# Patient Record
Sex: Male | Born: 1965 | Race: White | Hispanic: No | Marital: Married | Smoking: Never smoker
Health system: Southern US, Community
[De-identification: ages and names within clinical notes are randomized; demographics above are authoritative.]

## PROBLEM LIST (undated history)

## (undated) DIAGNOSIS — C801 Malignant (primary) neoplasm, unspecified: Secondary | ICD-10-CM

## (undated) DIAGNOSIS — I499 Cardiac arrhythmia, unspecified: Secondary | ICD-10-CM

## (undated) DIAGNOSIS — K219 Gastro-esophageal reflux disease without esophagitis: Secondary | ICD-10-CM

## (undated) HISTORY — DX: Gastro-esophageal reflux disease without esophagitis: K21.9

## (undated) HISTORY — PX: CHOLECYSTECTOMY: SHX55

---

## 2008-08-05 ENCOUNTER — Ambulatory Visit: Payer: Self-pay | Admitting: Sports Medicine

## 2008-08-05 ENCOUNTER — Encounter: Payer: Self-pay | Admitting: Sports Medicine

## 2008-08-05 DIAGNOSIS — M629 Disorder of muscle, unspecified: Secondary | ICD-10-CM | POA: Insufficient documentation

## 2008-08-05 DIAGNOSIS — M2140 Flat foot [pes planus] (acquired), unspecified foot: Secondary | ICD-10-CM | POA: Insufficient documentation

## 2008-08-05 DIAGNOSIS — M7750 Other enthesopathy of unspecified foot: Secondary | ICD-10-CM | POA: Insufficient documentation

## 2008-08-05 DIAGNOSIS — M722 Plantar fascial fibromatosis: Secondary | ICD-10-CM | POA: Insufficient documentation

## 2009-03-21 ENCOUNTER — Ambulatory Visit: Payer: Self-pay | Admitting: Sports Medicine

## 2009-03-21 DIAGNOSIS — S46819A Strain of other muscles, fascia and tendons at shoulder and upper arm level, unspecified arm, initial encounter: Secondary | ICD-10-CM

## 2009-03-21 DIAGNOSIS — M7710 Lateral epicondylitis, unspecified elbow: Secondary | ICD-10-CM | POA: Insufficient documentation

## 2009-03-21 DIAGNOSIS — S43499A Other sprain of unspecified shoulder joint, initial encounter: Secondary | ICD-10-CM | POA: Insufficient documentation

## 2009-04-25 ENCOUNTER — Ambulatory Visit: Payer: Self-pay | Admitting: Sports Medicine

## 2009-05-30 ENCOUNTER — Ambulatory Visit: Payer: Self-pay | Admitting: Sports Medicine

## 2010-05-09 NOTE — Assessment & Plan Note (Signed)
Summary: F/U,MC   Vital Signs:  Patient profile:   45 year old male BP sitting:   108 / 73  Vitals Entered By: Lillia Pauls CMA (April 25, 2009 9:48 AM)  History of Present Illness: RIGHT LATERAL EPICONDYLITIS.  Feels it is 40-50% better.  Has been using NTG patches, doing rehab exercises, and using strap.  He is still not weight lifting.  He is RIGHT hand dominant and denies daily pain, but does occasionally get pain with some movements.  LEFT BICEPS STRAIN.  Feels this is 20% improved.  Only has pain with supination.  Allergies: No Known Drug Allergies  Physical Exam  General:  Well-developed,well-nourished,in no acute distress; alert,appropriate and cooperative throughout examination Msk:  Upper Ext Inspection normal with no visible erythema or swelling. ROM wnl bilat No tenderness on RIGHT lateral epicondyle Cannow hold mod weight book in full extensin without pain  Pain with supination LEFT elbow no weakness Additional Exam:  [facility] [place] Repeat US done and shows significantly smaller tear of ext tendon on RT there are actually more neovessels trans view shows volaume of tear as 0.17 cm2 and diameter is < 1 cm  left elbow shows some increased blood fow at insertion of bicipital tendon into radius but no sign of tear   Impression & Recommendations:  Problem # 1:  LATERAL EPICONDYLITIS, RIGHT (ICD-726.32) Assessment Improved Lateral epicodnylitis secondary to partial common extensor tear.  Improved clinically and on ultrasound.  Continue exercises for range of motion and strengthening, NTG patches. Follow up 1 month.  Problem # 2:  SPRAIN&STRAIN OTH SPEC SITES SHOULDER&UPPER ARM (ICD-840.8) Likely LEFT biceps strain due to overcompensation for RIGHT lateral epicondylitis.  [place] negative for tear.  Continue supportive care and strengthening.  Okay to start biceps curls to horizontal.  Complete Medication List: 1)  Nitro-dur 0.2 Mg/hr Pt24 (Nitroglycerin) .... Apply as  directed to the forearm x 1 month qs

## 2010-05-09 NOTE — Assessment & Plan Note (Signed)
Summary: F/U,MC   Vital Signs:  Patient profile:   45 year old male Pulse rate:   63 / minute BP sitting:   99 / 70  (right arm)  Vitals Entered By: Terese Door (May 30, 2009 10:06 AM) CC: F/U right elbow Pain Assessment Patient in pain? no        CC:  F/U right elbow.  History of Present Illness: 45 y.o male here for F/U of Right lateral epicondylitis secondary to small common extensor tendon tear and small epicondyle avulsion.  Pt states that feels elbow is now 90% better.  Has mild point tenderness over lateral elbow.  Continues to do prescribed exercises, wears arm band, lifting 10lbs on affected side, and continues to use nitro patch on elbow.  Pt used to have problems with opening jars and shaking hands and now can do without any pain.   Pt had a Left sided compensation that resulted in biceps tendon strain previously that continues to be sore if tries to lift weights from full elbow extension, but claims that this pain is also improving as continues to use right arm more.   Allergies: No Known Drug Allergies  Physical Exam  General:  alert, well-developed, and well-nourished.   Msk:  Right elbow/arm: -No signs of edema -Mild point tenderness over lateral elbow at common extensor tendon. -FROM -5/5 strength -No pain with handshake or with wrist extension.  can hold a book in full extension of wrist without pain  Left elbow/arm: -No signs of edema -Mild tenderness in antecubital fossa -FROM -5/5 strength.  Additional Exam:  MSK [place] the tear looks healed small spur remains at insertion of ext tendon neovessels have all resolved  bicipital tendon also looks intact and no increase flow on doppler   Impression & Recommendations:  Problem # 1:  LATERAL EPICONDYLITIS, RIGHT (ICD-726.32) This is healing faster than predicited try to wean NTG by going to every other day if thiw works wean off over next 2 weeks  cont baseline exercises to prevent  recurrence  Problem # 2:  SPRAIN&STRAIN OTH SPEC SITES SHOULDER&UPPER ARM (ICD-840.8) do some eccentric neg curls with light weights as part of warmup cont to do this regularly gradually increase the  exercise load  reck prn  Complete Medication List: 1)  Nitro-dur 0.2 Mg/hr Pt24 (Nitroglycerin) .... Apply as directed to the forearm x 1 month qs  Appended Document: F/U,MC per [facility], can use for healing scan with NTG

## 2011-08-07 ENCOUNTER — Encounter: Payer: Self-pay | Admitting: Family Medicine

## 2011-08-07 ENCOUNTER — Ambulatory Visit (INDEPENDENT_AMBULATORY_CARE_PROVIDER_SITE_OTHER): Payer: Federal, State, Local not specified - PPO | Admitting: Family Medicine

## 2011-08-07 VITALS — BP 124/77 | HR 62 | Temp 97.9°F | Ht 70.0 in | Wt 209.0 lb

## 2011-08-07 DIAGNOSIS — M25562 Pain in left knee: Secondary | ICD-10-CM

## 2011-08-07 DIAGNOSIS — M25569 Pain in unspecified knee: Secondary | ICD-10-CM

## 2011-08-07 NOTE — Patient Instructions (Signed)
You have strained your left quad tendon. Avoid running, jumping for next 3-4 weeks. Start strengthening - decline half-squats 3 sets of 15 once a day most important exercise though also do leg extensions and straight leg raises. Ice 15 minutes at a time as needed. Ibuprofen and/or tylenol as needed for pain. A knee brace/sleeve is unlikely to be helpful. Follow up with me in 1 month or as needed.

## 2011-08-08 ENCOUNTER — Encounter: Payer: Self-pay | Admitting: Family Medicine

## 2011-08-08 DIAGNOSIS — M25562 Pain in left knee: Secondary | ICD-10-CM | POA: Insufficient documentation

## 2011-08-08 NOTE — Progress Notes (Signed)
  Subjective:    Patient ID: Shawn Suarez, male    DOB: 1966-02-26, 46 y.o.   MRN: [id number]  PCP: Albertina [place]  HPI 46 yo M here for left knee injury.  Patient reports on 4/19 he was stepping down a step when he slid and caught himself abruptly with left foot planted on ground. No twisting or hyperextension of his left knee. Developed pain/soreness right away just superior to kneecap that slowly improved. No swelling or bruising. Then that night started to get progressively more sore, achy. This has persisted with only mild improvement since the injury. Taking ibuprofen 600mg  tid prn. Pain worse with squatting, climbing stairs. No prior left knee injuries/surgeries or other issues.  Past Medical History  Diagnosis Date  . GERD (gastroesophageal reflux disease)     Current Outpatient Prescriptions on File Prior to Visit  Medication Sig Dispense Refill  . pantoprazole (PROTONIX) 20 MG tablet Take 40 mg by mouth daily.        Past Surgical History  Procedure Date  . Cholecystectomy     No Known Allergies  History   Social History  . Marital Status: Married    Spouse Name: N/A    Number of Children: N/A  . Years of Education: N/A   Occupational History  . Not on file.   Social History Main Topics  . Smoking status: Never Smoker   . Smokeless tobacco: Current User   Comment: 1 can per 1.5 weeks  . Alcohol Use: Not on file  . Drug Use: Not on file  . Sexually Active: Not on file   Other Topics Concern  . Not on file   Social History Narrative  . No narrative on file    Family History  Problem Relation Age of Onset  . Hypertension Mother   . Diabetes Father   . Heart attack Neg Hx   . Hyperlipidemia Neg Hx   . Sudden death Neg Hx     BP 124/77  Pulse 62  Temp(Src) 97.9 F (36.6 C) (Oral)  Ht 5\' 10"  (1.778 m)  Wt 209 lb (94.802 kg)  BMI 29.99 kg/m2  Review of Systems See HPI above.    Objective:   Physical Exam Gen: NAD  L knee: No gross  deformity, ecchymoses, swelling. Mild TTP superior patella at quad tendon insertion.  No joint line, post patellar, patellar tendon or other TTP. FROM. Negative ant/post drawers. Negative valgus/varus testing. Negative lachmanns. Negative mcmurrays, apleys, patellar apprehension, clarkes. NV intact distally.  R knee: FROM without pain or instability.  MSK u/s: L knee quad tendon intact without increased neovascularity.  Some increased hypoechogenicity deep to quad tendon however compared to right side.     Assessment & Plan:  1. Left knee pain - 2/2 quad tendon strain from injury on 4/19.  U/s shows tendon appears intact though increased fluid deep to this likely from microtears within tendon.  Avoid running, jumping - start strengthening exercises (he would like to go ahead with home program instead of PT at this time) including decline squat eccentric exercise.  Icing, nsaids, tylenol as needed.  F/u in 1 month or prn.  Note provided for military activities to follow restrictions.

## 2011-08-08 NOTE — Assessment & Plan Note (Signed)
2/2 quad tendon strain from injury on 4/19.  U/s shows tendon appears intact though increased fluid deep to this likely from microtears within tendon.  Avoid running, jumping - start strengthening exercises (he would like to go ahead with home program instead of PT at this time) including decline squat eccentric exercise.  Icing, nsaids, tylenol as needed.  F/u in 1 month or prn.  Note provided for military activities to follow restrictions.

## 2013-06-30 ENCOUNTER — Ambulatory Visit (INDEPENDENT_AMBULATORY_CARE_PROVIDER_SITE_OTHER): Payer: Federal, State, Local not specified - PPO | Admitting: Sports Medicine

## 2013-06-30 ENCOUNTER — Encounter: Payer: Self-pay | Admitting: Sports Medicine

## 2013-06-30 VITALS — BP 122/85 | HR 82 | Ht 70.0 in | Wt 220.0 lb

## 2013-06-30 DIAGNOSIS — M25552 Pain in left hip: Secondary | ICD-10-CM | POA: Insufficient documentation

## 2013-06-30 DIAGNOSIS — M25559 Pain in unspecified hip: Secondary | ICD-10-CM

## 2013-06-30 DIAGNOSIS — M25539 Pain in unspecified wrist: Secondary | ICD-10-CM

## 2013-06-30 DIAGNOSIS — M25531 Pain in right wrist: Secondary | ICD-10-CM | POA: Insufficient documentation
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
## 2013-06-30 NOTE — Assessment & Plan Note (Addendum)
S/p old injury of unknown nature, now with acute pain most likely due to deep hip flexors.  Of note, patient has increased internal rotation at baseline. - Pelvic X-ray to evaluate hip anatomy - Symptomatic treatment with avoidance of activities that cause pain - F/u as needed or if fails to improve

## 2013-06-30 NOTE — Patient Instructions (Signed)
We will get X-rays of your pelvis and your right wrist. (Make sure they get an X-ray with a closed fist.)   Dr. Oneida Alar will call you if there is anything he wants to discuss with you.  Wear the wrist wrap for support.  Come back as needed.

## 2013-06-30 NOTE — Assessment & Plan Note (Signed)
One month of pain after fall on outstretched hand is concerning for ligament injury (tear vs. Sprain.) TTP over lunate. Ultrasound shows changes in the joint. - X-ray of wrist (with closed fist) - Given wrist loop brace for support of wrist until healed - Symptomatic management

## 2013-07-06 ENCOUNTER — Ambulatory Visit
Admission: RE | Admit: 2013-07-06 | Discharge: 2013-07-06 | Disposition: A | Discharge status: Home / Self Care | Payer: Federal, State, Local not specified - PPO | Source: Ambulatory Visit | Attending: Sports Medicine | Admitting: Sports Medicine

## 2013-07-06 DIAGNOSIS — M25552 Pain in left hip: Secondary | ICD-10-CM

## 2013-07-06 DIAGNOSIS — M25531 Pain in right wrist: Secondary | ICD-10-CM

## 2013-07-23 ENCOUNTER — Telehealth: Payer: Self-pay | Admitting: *Deleted

## 2013-07-23 NOTE — Telephone Encounter (Signed)
Called pt left VM to return call

## 2013-07-23 NOTE — Telephone Encounter (Signed)
Message copied by Ocie Bob on Thu Jul 23, 2013  3:36 PM ------      Message from: Stefanie Libel      Created: Tue Jul 21, 2013  8:59 PM       I tired calling Shawn Suarez before leaving to see how he was doing.  His Xray results  Did not show major changes so I want to know how he is doing. ------

## 2019-05-14 DIAGNOSIS — Z23 Encounter for immunization: Secondary | ICD-10-CM | POA: Diagnosis not present

## 2019-05-14 DIAGNOSIS — Z0000 Encounter for general adult medical examination without abnormal findings: Secondary | ICD-10-CM | POA: Diagnosis not present

## 2019-12-24 DIAGNOSIS — R002 Palpitations: Secondary | ICD-10-CM | POA: Diagnosis not present

## 2019-12-24 DIAGNOSIS — Z23 Encounter for immunization: Secondary | ICD-10-CM | POA: Diagnosis not present

## 2020-02-02 DIAGNOSIS — I491 Atrial premature depolarization: Secondary | ICD-10-CM | POA: Diagnosis not present

## 2020-02-02 DIAGNOSIS — I471 Supraventricular tachycardia: Secondary | ICD-10-CM | POA: Diagnosis not present

## 2020-03-24 DIAGNOSIS — R0683 Snoring: Secondary | ICD-10-CM | POA: Diagnosis not present

## 2020-03-24 DIAGNOSIS — Z87891 Personal history of nicotine dependence: Secondary | ICD-10-CM | POA: Diagnosis not present

## 2020-03-24 DIAGNOSIS — Z79899 Other long term (current) drug therapy: Secondary | ICD-10-CM | POA: Diagnosis not present

## 2020-03-24 DIAGNOSIS — R198 Other specified symptoms and signs involving the digestive system and abdomen: Secondary | ICD-10-CM | POA: Diagnosis not present

## 2020-03-24 DIAGNOSIS — K219 Gastro-esophageal reflux disease without esophagitis: Secondary | ICD-10-CM | POA: Diagnosis not present

## 2020-03-24 DIAGNOSIS — J343 Hypertrophy of nasal turbinates: Secondary | ICD-10-CM | POA: Diagnosis not present

## 2020-03-24 DIAGNOSIS — G4733 Obstructive sleep apnea (adult) (pediatric): Secondary | ICD-10-CM | POA: Diagnosis not present

## 2020-03-24 DIAGNOSIS — J342 Deviated nasal septum: Secondary | ICD-10-CM | POA: Diagnosis not present

## 2020-03-24 DIAGNOSIS — R6889 Other general symptoms and signs: Secondary | ICD-10-CM | POA: Diagnosis not present

## 2020-03-24 DIAGNOSIS — F458 Other somatoform disorders: Secondary | ICD-10-CM | POA: Diagnosis not present

## 2020-03-24 DIAGNOSIS — J328 Other chronic sinusitis: Secondary | ICD-10-CM | POA: Diagnosis not present

## 2020-04-14 DIAGNOSIS — C61 Malignant neoplasm of prostate: Secondary | ICD-10-CM | POA: Diagnosis not present

## 2020-04-14 DIAGNOSIS — E6609 Other obesity due to excess calories: Secondary | ICD-10-CM | POA: Diagnosis not present

## 2020-04-14 DIAGNOSIS — R002 Palpitations: Secondary | ICD-10-CM | POA: Diagnosis not present

## 2020-04-14 DIAGNOSIS — K219 Gastro-esophageal reflux disease without esophagitis: Secondary | ICD-10-CM | POA: Diagnosis not present

## 2020-04-14 DIAGNOSIS — E785 Hyperlipidemia, unspecified: Secondary | ICD-10-CM | POA: Diagnosis not present

## 2020-05-19 DIAGNOSIS — K219 Gastro-esophageal reflux disease without esophagitis: Secondary | ICD-10-CM | POA: Diagnosis not present

## 2020-05-19 DIAGNOSIS — J343 Hypertrophy of nasal turbinates: Secondary | ICD-10-CM | POA: Diagnosis not present

## 2020-05-19 DIAGNOSIS — R6889 Other general symptoms and signs: Secondary | ICD-10-CM | POA: Diagnosis not present

## 2020-05-19 DIAGNOSIS — J342 Deviated nasal septum: Secondary | ICD-10-CM | POA: Diagnosis not present

## 2020-10-03 DIAGNOSIS — K648 Other hemorrhoids: Secondary | ICD-10-CM | POA: Diagnosis not present

## 2020-10-03 DIAGNOSIS — K640 First degree hemorrhoids: Secondary | ICD-10-CM | POA: Diagnosis not present

## 2020-10-03 DIAGNOSIS — Z1211 Encounter for screening for malignant neoplasm of colon: Secondary | ICD-10-CM | POA: Diagnosis not present

## 2020-10-03 DIAGNOSIS — K5730 Diverticulosis of large intestine without perforation or abscess without bleeding: Secondary | ICD-10-CM | POA: Diagnosis not present

## 2020-10-03 DIAGNOSIS — Z86010 Personal history of colonic polyps: Secondary | ICD-10-CM | POA: Diagnosis not present

## 2021-01-06 DIAGNOSIS — U071 COVID-19: Secondary | ICD-10-CM | POA: Diagnosis not present

## 2021-02-02 DIAGNOSIS — J029 Acute pharyngitis, unspecified: Secondary | ICD-10-CM | POA: Diagnosis not present

## 2021-02-02 DIAGNOSIS — H6692 Otitis media, unspecified, left ear: Secondary | ICD-10-CM | POA: Diagnosis not present

## 2021-02-21 ENCOUNTER — Ambulatory Visit (INDEPENDENT_AMBULATORY_CARE_PROVIDER_SITE_OTHER): Payer: Federal, State, Local not specified - PPO

## 2021-02-21 ENCOUNTER — Other Ambulatory Visit: Payer: Self-pay

## 2021-02-21 ENCOUNTER — Other Ambulatory Visit: Payer: Self-pay | Admitting: *Deleted

## 2021-02-21 ENCOUNTER — Ambulatory Visit: Payer: Federal, State, Local not specified - PPO | Admitting: Cardiology

## 2021-02-21 ENCOUNTER — Encounter: Payer: Self-pay | Admitting: Cardiology

## 2021-02-21 VITALS — BP 108/70 | HR 67 | Ht 70.0 in | Wt 262.4 lb

## 2021-02-21 DIAGNOSIS — Z01812 Encounter for preprocedural laboratory examination: Secondary | ICD-10-CM

## 2021-02-21 DIAGNOSIS — I209 Angina pectoris, unspecified: Secondary | ICD-10-CM

## 2021-02-21 DIAGNOSIS — R002 Palpitations: Secondary | ICD-10-CM | POA: Diagnosis not present

## 2021-02-21 DIAGNOSIS — R0602 Shortness of breath: Secondary | ICD-10-CM | POA: Diagnosis not present

## 2021-02-21 LAB — BASIC METABOLIC PANEL
BUN/Creatinine Ratio: 12 (ref 9–20)
BUN: 14 mg/dL (ref 6–24)
CO2: 25 mmol/L (ref 20–29)
Calcium: 9.9 mg/dL (ref 8.7–10.2)
Chloride: 100 mmol/L (ref 96–106)
Creatinine, Ser: 1.17 mg/dL (ref 0.76–1.27)
Glucose: 101 mg/dL — ABNORMAL HIGH (ref 70–99)
Potassium: 4.4 mmol/L (ref 3.5–5.2)
Sodium: 140 mmol/L (ref 134–144)
eGFR: 73 mL/min/{1.73_m2} (ref >59)

## 2021-02-21 MED ORDER — METOPROLOL TARTRATE 50 MG PO TABS: 50.0000 mg | ORAL_TABLET | Freq: Once | ORAL | 0 refills | Status: DC

## 2021-02-21 NOTE — Progress Notes (Unsigned)
Applied a 14 day Zio XT monitor to patient in the office ?

## 2021-02-21 NOTE — Progress Notes (Addendum)
Cardiology Office Note:    Date:  02/21/2021   ID:  Mickel Fuchs, DOB 1965/10/20, MRN [id number]  PCP:  Antony Contras, MD   [facility] Providers Cardiologist:  None     Referring MD: Drake Leach, MD    History of Present Illness:    Shawn Suarez is a 55 y.o. male here for the evaluation of rapid heart rate/palpitations, continued heart rate elevation following exercise, shortness of breath.  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  Also has been working on PhD in Geographical information systems officer.  His daughter rides horses at [employer].  Previously in the [employer]  Was a Runner, broadcasting/film/video.  He has noticed increased exertional fatigue/palpitations/some shortness of breath with activity.  For instance, when he was carrying his grandson bring him to the car to put him in his car seat, he felt increase skipping, worrisome sensation.  He is also felt this when walking his neighborhood going up hills for instance feels as though he needs to stop it because of the palpitations/increased PVCs.  He had an episode at the airport when rushing for a flight where he had to stop because of these palpitations/sensations.  Currently a non-smoker.  He has eliminated caffeine from his diet because of these feelings.  No stimulants, no decongestants.  No high risk symptoms such as syncope.  His father had a "widow maker "MI at age 53 with stent placement.  His mother has had atrial fibrillation since her 25s.  He states that since retiring from [employer] he has gained approximately 70 pounds.  He is worried about exercising because of these symptoms.  He is also experienced some mild edema at the end of the day.  Has had mild intermittent asthma occasionally utilizing Dulera.    Past Medical History:  Diagnosis Date   GERD (gastroesophageal reflux disease)     Past Surgical History:  Procedure Laterality Date   CHOLECYSTECTOMY      Current Medications: Current  Meds  Medication Sig   Cholecalciferol 25 MCG (1000 UT) tablet Take 1,000 Units by mouth daily.   Coenzyme Q10 100 MG capsule Take 100 mg by mouth daily.   metoprolol tartrate (LOPRESSOR) 50 MG tablet Take 1 tablet (50 mg total) by mouth once for 1 dose. Take one tablet (2) hours before your CT   Multiple Vitamin (MULTIVITAMIN) tablet Take 1 tablet by mouth daily.   pantoprazole (PROTONIX) 20 MG tablet Take 20 mg by mouth daily.     Allergies:   Patient has no known allergies.   Social History   Socioeconomic History   Marital status: Married    Spouse name: Not on file   Number of children: Not on file   Years of education: Not on file   Highest education level: Not on file  Occupational History   Not on file  Tobacco Use   Smoking status: Never   Smokeless tobacco: Current   Tobacco comments:    1 can per 1.5 weeks  Substance and Sexual Activity   Alcohol use: Not on file   Drug use: Not on file   Sexual activity: Not on file  Other Topics Concern   Not on file  Social History Narrative   Not on file   Social Determinants of Health   Financial Resource Strain: Not on file  Food Insecurity: Not on file  Transportation Needs: Not on file  Physical Activity: Not on file  Stress: Not on  file  Social Connections: Not on file     Family History: The patient's family history includes Diabetes in his father; Hypertension in his mother. There is no history of Heart attack, Hyperlipidemia, or Sudden death.  ROS:   Please see the history of present illness.    No fevers chills nausea vomiting syncope bleeding all other systems reviewed and are negative.  EKGs/Labs/Other Studies Reviewed:    The following studies were reviewed today: Prior office notes reviewed.  Lab work reviewed, sodium 138 potassium 4.1 creatinine 1.1.  Total cholesterol 180 triglycerides 95 HDL 44 LDL 133.  EKG:  EKG is  ordered today.  The ekg ordered today demonstrates 67 bpm with no other  abnormalities.  Normal intervals.  During 1 episode while at the barn, apple watch EKG showed sinus rhythm with frequent PVCs.  Recent Labs: No results found for requested labs within last 8760 hours.  Recent Lipid Panel No results found for: CHOL, TRIG, HDL, CHOLHDL, VLDL, LDLCALC, LDLDIRECT   Risk Assessment/Calculations:            Physical Exam:    VS:  BP 108/70   Pulse 67   Ht 5\' 10"  (1.778 m)   Wt 262 lb 6.4 oz (119 kg)   SpO2 96%   BMI 37.65 kg/m     Wt Readings from Last 3 Encounters:  02/21/21 262 lb 6.4 oz (119 kg)  06/30/13 220 lb (99.8 kg)  08/07/11 209 lb (94.8 kg)     GEN:  Well nourished, well developed in no acute distress HEENT: Normal NECK: No JVD; No carotid bruits LYMPHATICS: No lymphadenopathy CARDIAC: RRR, no murmurs, no rubs, gallops RESPIRATORY:  Clear to auscultation without rales, wheezing or rhonchi  ABDOMEN: Soft, non-tender, non-distended MUSCULOSKELETAL:  No edema; No deformity  SKIN: Warm and dry NEUROLOGIC:  Alert and oriented x 3 PSYCHIATRIC:  Normal affect   ASSESSMENT:    1. Shortness of breath   2. Palpitations   3. Angina pectoris (HCC)    PLAN:    In order of problems listed above:  Palpitations We will check a Zio patch monitor to ensure that he is not have any dangerous or adverse arrhythmias.  PVCs have been noted previously on apple watch.  This will also help assess his overall pulse rate on average.  Current EKG reassuring.  Thankfully no higher symptoms such as syncope.  Angina pectoris ([place]) I am concerned that his symptoms which are decreasing his exercise tolerance may be an anginal equivalent.  He certainly is worried about pushing himself and consistently is feeling a dramatic increase in PVCs during exertional activity.  Also notes some increased shortness of breath as well.  Given these constellation of symptoms, we will go ahead and check a coronary CT scan with possible FFR analysis to hopefully help  exclude flow-limiting coronary artery disease.  His father at the age of 66 had a proximal LAD lesion and required stenting in the setting of MI.  Obviously if plaque is noted, Crestor would be helpful to reduce his LDL from 133.  Shortness of breath We are going to check an echocardiogram to ensure proper structure and function of his heart.  Frequent PVCs may be a signal for potential structural issue.   We will follow-up with results of studies.  From there help to guide exercise etc.  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   PLAN: TCTS consult for myxoma resection, MAZE, LA clipping.  Cardiac MRI pending.    Medication Adjustments/Labs and Tests Ordered: Current medicines are reviewed at length with the patient today.  Concerns regarding medicines are outlined above.  Orders Placed This Encounter  Procedures   CT CORONARY MORPH W/CTA COR W/SCORE W/CA W/CM &/OR WO/CM   LONG TERM MONITOR (3-14 DAYS)   ECHOCARDIOGRAM COMPLETE   Meds ordered this encounter  Medications   metoprolol tartrate (LOPRESSOR) 50 MG tablet    Sig: Take 1 tablet (50 mg total) by mouth once for 1 dose. Take one tablet (2) hours before your CT    Dispense:  1 tablet    Refill:  0    Patient Instructions  Medication Instructions:  The current medical regimen is effective;  continue present plan and medications.  *If you need a refill on your cardiac medications before your next appointment, please call your pharmacy*  Lab Work: Please have blood work today (BMP) If you have labs (blood work) drawn today and your tests are completely normal, you will receive your results only by: MyChart Message (if you have MyChart) OR A paper copy in the mail If you have any lab test that is abnormal or we need  to change your treatment, we will call you to review the results.   Testing/Procedures: Your physician has requested that you have an echocardiogram. Echocardiography is a painless test that uses sound waves to create images of your heart. It provides your doctor with information about the size and shape of your heart and how well your heart's chambers and valves are working. This procedure takes approximately one hour. There are no restrictions for this procedure.  ZIO XT- Long Term Monitor Instructions  Your physician has requested you wear a ZIO patch monitor for 14 days.  This is a single patch monitor. [employer] supplies one patch monitor per enrollment. Additional stickers are not available. Please do not apply patch if you will be having a Nuclear Stress Test,  Echocardiogram, Cardiac CT, MRI, or Chest Xray during the period you would be wearing the  monitor. The patch cannot be worn during these tests. You cannot remove and re-apply the  ZIO XT patch monitor.  Your ZIO patch monitor will be mailed 3 day USPS to your address on file. It may take 3-5 days  to receive your monitor after you have been enrolled.  Once you have received your monitor, please review the enclosed instructions. Your monitor  has already been registered assigning a specific monitor serial # to you.  Billing and Patient Assistance Program Information  We have supplied [employer] with any of your insurance information on file for billing purposes. [employer] offers a sliding scale Patient Assistance Program for patients that do not have  insurance, or whose insurance does not completely cover the cost of the ZIO monitor.  You must apply for the Patient Assistance Program to qualify for this discounted rate.  To apply, please call [employer] at [phone], select option 4, select option 2, ask to apply for  Patient Assistance Program. Theodore Demark will ask your household income, and how many people  are in your household.  They will quote your out-of-pocket cost based on that information.  [employer] will also be able to set up a 22-month, interest-free payment plan if needed.  Applying the monitor  Shave hair from upper left chest.  Hold abrader disc by orange tab. Rub abrader in 40 strokes over the upper left chest as  indicated in your monitor instructions.  Clean area with 4 enclosed alcohol pads. Let dry.  Apply patch as indicated in monitor instructions. Patch will be placed under collarbone on left  side of chest with arrow pointing upward.  Rub patch adhesive wings for 2 minutes. Remove white label marked "1". Remove the white  label marked "2". Rub patch adhesive wings for 2 additional minutes.  While looking in a mirror, press and release button in center of patch. A small green light will  flash 3-4 times. This will be your only indicator that the monitor has been turned on.  Do not shower for the first 24 hours. You may shower after the first 24 hours.  Press the button if you feel a symptom. You will hear a small click. Record Date, Time and  Symptom in the Patient Logbook.  When you are ready to remove the patch, follow instructions on the last 2 pages of Patient  Logbook. Stick patch monitor onto the last page of Patient Logbook.  Place Patient Logbook in the blue and white box. Use locking tab on box and tape box closed  securely. The blue and white box has prepaid postage on it. Please place it in the mailbox as  soon as possible. Your physician should have your test results approximately 7 days after the  monitor has been mailed back to [facility].  Call [place] at [phone] if you have questions regarding  your ZIO XT patch monitor. Call them immediately if you see an orange light blinking on your  monitor.  If your monitor falls off in less than 4 days, contact our Monitor department at [phone].  If your monitor becomes loose or falls off after 4 days call  [employer] at [phone] for  suggestions on securing your monitor    Your cardiac CT will be scheduled at :   [facility] [place], [place] [address] [phone]  Please arrive at the [facility] main entrance (entrance A) of [facility] 30 minutes prior to test start time. You can use the FREE valet parking offered at the main entrance (encouraged to control the heart rate for the test) Proceed to the [facility] Radiology Department (first floor) to check-in and test prep.  Please follow these instructions carefully (unless otherwise directed):  On the Night Before the Test: Be sure to Drink plenty of water. Do not consume any caffeinated/decaffeinated beverages or chocolate 12 hours prior to your test. Do not take any antihistamines 12 hours prior to your test.  On the Day of the Test: Drink plenty of water until 1 hour prior to the test. Do not eat any food 4 hours prior to the test. You may take your regular medications prior to the test.  Take metoprolol (Lopressor) two hours prior to test.  After the Test: Drink plenty of water. After receiving IV contrast, you may experience a mild flushed feeling. This is normal. On occasion, you may experience a mild rash up to 24 hours after the test. This is not dangerous. If this occurs, you can take Benadryl 25 mg and increase your fluid intake. If you experience trouble breathing, this can be serious. If it is severe call 911 IMMEDIATELY. If it is mild, please call our office.  Please allow 2-4 weeks for scheduling  of routine cardiac CTs. Some insurance companies require a pre-authorization which may delay scheduling of this test.   For non-scheduling related questions, please contact the cardiac imaging nurse navigator should you have any questions/concerns: Marchia Bond, Cardiac Imaging Nurse Navigator Gordy Clement, Cardiac Imaging Nurse Navigator [facility] Direct Office Dial: [phone]   For scheduling needs, including cancellations and rescheduling, please call [place], [phone].  Follow-Up: At [facility], you and your health needs are our priority.  As part of our continuing mission to provide you with exceptional heart care, we have created designated Provider Care Teams.  These Care Teams include your primary Cardiologist (physician) and Advanced Practice Providers (APPs -  Physician Assistants and Nurse Practitioners) who all work together to provide you with the care you need, when you need it.  We recommend signing up for the patient portal called "MyChart".  Sign up information is provided on this After Visit Summary.  MyChart is used to connect with patients for Virtual Visits (Telemedicine).  Patients are able to view lab/test results, encounter notes, upcoming appointments, etc.  Non-urgent messages can be sent to your provider as well.   To learn more about what you can do with MyChart, go to [email or web address].    Your next appointment:   Follow up will be based in the above results.  Thank you for choosing [facility]!!     Signed, [name], MD  02/21/2021 11:28 AM    [place]

## 2021-02-21 NOTE — Assessment & Plan Note (Signed)
I am concerned that his symptoms which are decreasing his exercise tolerance may be an anginal equivalent.  He certainly is worried about pushing himself and consistently is feeling a dramatic increase in PVCs during exertional activity.  Also notes some increased shortness of breath as well.  Given these constellation of symptoms, we will go ahead and check a coronary CT scan with possible FFR analysis to hopefully help exclude flow-limiting coronary artery disease.  His father at the age of 69 had a proximal LAD lesion and required stenting in the setting of MI.  Obviously if plaque is noted, Crestor would be helpful to reduce his LDL from 133.

## 2021-02-21 NOTE — Assessment & Plan Note (Signed)
We are going to check an echocardiogram to ensure proper structure and function of his heart.  Frequent PVCs may be a signal for potential structural issue.

## 2021-02-21 NOTE — Patient Instructions (Signed)
Medication Instructions:  The current medical regimen is effective;  continue present plan and medications.  *If you need a refill on your cardiac medications before your next appointment, please call your pharmacy*  Lab Work: Please have blood work today (BMP) If you have labs (blood work) drawn today and your tests are completely normal, you will receive your results only by: MyChart Message (if you have MyChart) OR A paper copy in the mail If you have any lab test that is abnormal or we need to change your treatment, we will call you to review the results.   Testing/Procedures: Your physician has requested that you have an echocardiogram. Echocardiography is a painless test that uses sound waves to create images of your heart. It provides your doctor with information about the size and shape of your heart and how well your heart's chambers and valves are working. This procedure takes approximately one hour. There are no restrictions for this procedure.  ZIO XT- Long Term Monitor Instructions  Your physician has requested you wear a ZIO patch monitor for 14 days.  This is a single patch monitor. [employer] supplies one patch monitor per enrollment. Additional stickers are not available. Please do not apply patch if you will be having a Nuclear Stress Test,  Echocardiogram, Cardiac CT, MRI, or Chest Xray during the period you would be wearing the  monitor. The patch cannot be worn during these tests. You cannot remove and re-apply the  ZIO XT patch monitor.  Your ZIO patch monitor will be mailed 3 day USPS to your address on file. It may take 3-5 days  to receive your monitor after you have been enrolled.  Once you have received your monitor, please review the enclosed instructions. Your monitor  has already been registered assigning a specific monitor serial # to you.  Billing and Patient Assistance Program Information  We have supplied [employer] with any of your insurance information on  file for billing purposes. [employer] offers a sliding scale Patient Assistance Program for patients that do not have  insurance, or whose insurance does not completely cover the cost of the ZIO monitor.  You must apply for the Patient Assistance Program to qualify for this discounted rate.  To apply, please call [employer] at [phone], select option 4, select option 2, ask to apply for  Patient Assistance Program. Theodore Demark will ask your household income, and how many people  are in your household. They will quote your out-of-pocket cost based on that information.  [employer] will also be able to set up a 50-month, interest-free payment plan if needed.  Applying the monitor   Shave hair from upper left chest.  Hold abrader disc by orange tab. Rub abrader in 40 strokes over the upper left chest as  indicated in your monitor instructions.  Clean area with 4 enclosed alcohol pads. Let dry.  Apply patch as indicated in monitor instructions. Patch will be placed under collarbone on left  side of chest with arrow pointing upward.  Rub patch adhesive wings for 2 minutes. Remove white label marked "1". Remove the white  label marked "2". Rub patch adhesive wings for 2 additional minutes.  While looking in a mirror, press and release button in center of patch. A small green light will  flash 3-4 times. This will be your only indicator that the monitor has been turned on.  Do not shower for the first 24 hours. You may shower after the first 24 hours.  Press the button if  you feel a symptom. You will hear a small click. Record Date, Time and  Symptom in the Patient Logbook.  When you are ready to remove the patch, follow instructions on the last 2 pages of Patient  Logbook. Stick patch monitor onto the last page of Patient Logbook.  Place Patient Logbook in the blue and white box. Use locking tab on box and tape box closed  securely. The blue and white box has prepaid postage on it. Please place it in the  mailbox as  soon as possible. Your physician should have your test results approximately 7 days after the  monitor has been mailed back to [facility].  Call [place] at [phone] if you have questions regarding  your ZIO XT patch monitor. Call them immediately if you see an orange light blinking on your  monitor.  If your monitor falls off in less than 4 days, contact our Monitor department at [phone].  If your monitor becomes loose or falls off after 4 days call [employer] at [phone] for  suggestions on securing your monitor    Your cardiac CT will be scheduled at :   [facility] [place], [place] [address] [phone]  Please arrive at the [facility] main entrance (entrance A) of [facility] 30 minutes prior to test start time. You can use the FREE valet parking offered at the main entrance (encouraged to control the heart rate for the test) Proceed to the [facility] Radiology Department (first floor) to check-in and test prep.  Please follow these instructions carefully (unless otherwise directed):  On the Night Before the Test: Be sure to Drink plenty of water. Do not consume any caffeinated/decaffeinated beverages or chocolate 12 hours prior to your test. Do not take any antihistamines 12 hours prior to your test.  On the Day of the Test: Drink plenty of water until 1 hour prior to the test. Do not eat any food 4 hours prior to the test. You may take your regular medications prior to the test.  Take metoprolol (Lopressor) two hours prior to test.  After the Test: Drink plenty of water. After receiving IV contrast, you may experience a mild flushed feeling. This is normal. On occasion, you may experience a mild rash up to 24 hours after the test. This is not dangerous. If this occurs, you can take Benadryl 25 mg and increase your fluid intake. If you experience trouble breathing, this can be  serious. If it is severe call 911 IMMEDIATELY. If it is mild, please call our office.  Please allow 2-4 weeks for scheduling of routine cardiac CTs. Some insurance companies require a pre-authorization which may delay scheduling of this test.   For non-scheduling related questions, please contact the cardiac imaging nurse navigator should you have any questions/concerns: Marchia Bond, Cardiac Imaging Nurse Navigator Gordy Clement, Cardiac Imaging Nurse Navigator [facility] Direct Office Dial: [phone]   For scheduling needs, including cancellations and rescheduling, please call [place], [phone].  Follow-Up: At [facility], you and your health needs are our priority.  As part of our continuing mission to provide you with exceptional heart care, we have created designated Provider Care Teams.  These Care Teams include your primary Cardiologist (physician) and Advanced Practice Providers (APPs -  Physician Assistants and Nurse Practitioners) who all work together to provide you with the care you need, when you need it.  We recommend signing up for the patient  portal called "MyChart".  Sign up information is provided on this After Visit Summary.  MyChart is used to connect with patients for Virtual Visits (Telemedicine).  Patients are able to view lab/test results, encounter notes, upcoming appointments, etc.  Non-urgent messages can be sent to your provider as well.   To learn more about what you can do with MyChart, go to [email or web address].    Your next appointment:   Follow up will be based in the above results.  Thank you for choosing [facility]!!

## 2021-02-21 NOTE — Addendum Note (Signed)
Addended by: Shellia Cleverly on: 02/21/2021 11:49 AM   Modules accepted: Orders

## 2021-02-21 NOTE — Assessment & Plan Note (Addendum)
We will check a Zio patch monitor to ensure that he is not have any dangerous or adverse arrhythmias.  PVCs have been noted previously on apple watch.  This will also help assess his overall pulse rate on average.  Current EKG reassuring.  Thankfully no higher symptoms such as syncope.

## 2021-02-23 NOTE — Addendum Note (Signed)
Addended byDanielle Dess on: 02/23/2021 08:37 AM   Modules accepted: Orders

## 2021-02-27 ENCOUNTER — Encounter: Payer: Self-pay | Admitting: *Deleted

## 2021-03-09 ENCOUNTER — Ambulatory Visit: Payer: Federal, State, Local not specified - PPO | Admitting: Cardiology

## 2021-03-09 ENCOUNTER — Telehealth: Payer: Self-pay | Admitting: Cardiology

## 2021-03-09 NOTE — Telephone Encounter (Signed)
Patient c/o Palpitations:  High priority if patient c/o lightheadedness, shortness of breath, or chest pain  How long have you had palpitations/irregular HR/ Afib? Are you having the symptoms now? Started today about 30 mins ago   Are you currently experiencing lightheadedness, SOB or CP? No   Do you have a history of afib (atrial fibrillation) or irregular heart rhythm? Yes  Have you checked your BP or HR? (document readings if available):  HR just over 120   Are you experiencing any other symptoms? No     STAT if HR is under 50 or over 120 (normal HR is 60-100 beats per minute)  What is your heart rate? Over 120  Do you have a log of your heart rate readings (document readings)? No   Do you have any other symptoms? Palpitations

## 2021-03-09 NOTE — Telephone Encounter (Signed)
Pt called in to report HR in the 120's with associated feelings of heart skipping beats.  Pt reports getting out of bed to let dog out when episode began.   Event lasted at least 30 minutes and  currently feels heart is in rhythm. Reports no longer feeling skipped beats.  Pt reports he took X4 baby aspirin.  Pt denies CP, SOB, consuming caffeine or chocolate.  Pt does not have a recent BP readings last check was 02/21/21 OV.  Pt mailed Zio back 2 days ago. I have checked with heart monitor tech and results are not available at this time.  Pt denies having an event similar to this while wearing monitor.  I advised pt to call paramedics for an EKG if possible if event occurs again.  Pt has an Echo scheduled for 03/10/21 (no Echo appointments available today) and would like to have testing completed and results of heart monitor prior to OV with MD.  I will route to MD to advise.

## 2021-03-10 ENCOUNTER — Ambulatory Visit (HOSPITAL_COMMUNITY): Payer: Federal, State, Local not specified - PPO | Attending: Cardiology

## 2021-03-10 ENCOUNTER — Other Ambulatory Visit: Payer: Self-pay

## 2021-03-10 DIAGNOSIS — R002 Palpitations: Secondary | ICD-10-CM | POA: Insufficient documentation

## 2021-03-10 DIAGNOSIS — R0602 Shortness of breath: Secondary | ICD-10-CM | POA: Insufficient documentation

## 2021-03-10 LAB — ECHOCARDIOGRAM COMPLETE
Area-P 1/2: 3.28 cm2
S' Lateral: 3.4 cm

## 2021-03-10 MED ORDER — PERFLUTREN LIPID MICROSPHERE
3.0000 mL | INTRAVENOUS | Status: AC | PRN
Start: 2021-03-10 — End: 2021-03-10
  Administered 2021-03-10: 3 mL via INTRAVENOUS

## 2021-03-10 MED ORDER — METOPROLOL SUCCINATE ER 25 MG PO TB24: 25.0000 mg | ORAL_TABLET | Freq: Every day | ORAL | 11 refills | Status: DC

## 2021-03-10 NOTE — Telephone Encounter (Signed)
Thanks for the update.  Lets go ahead and start Toprol-XL 25 mg once a day, low-dose to help with palpitations/fast heart rhythm.  Looking forward to seeing the results of the monitor, other studies.   [name], MD  I placed call to patient and left message to call office.

## 2021-03-10 NOTE — Telephone Encounter (Signed)
Follow Up:     Patient is returning Pat's call from today.

## 2021-03-10 NOTE — Telephone Encounter (Signed)
Spoke to the patient and gave him Dr. Marlou Porch recommendations. Ordered medication a sent to pharmacy of choice.  Patient verbalized understanding.

## 2021-03-13 ENCOUNTER — Telehealth: Payer: Self-pay | Admitting: Cardiology

## 2021-03-13 NOTE — Telephone Encounter (Signed)
Magda Paganini from [facility] calling with abnormal zio patch results.

## 2021-03-13 NOTE — Telephone Encounter (Signed)
Shawn Suarez calling in to report final results for patient's zio monitor. She reports that the patient had an episode of rapid atrial fibrillation at 187bpm that lasted for 60 seconds. She states that this information can be found on page 36, strip 6. Results are already in patient's chart. Will route to ordering provider, Dr. Marlou Porch to make him aware.

## 2021-03-16 NOTE — Progress Notes (Signed)
Pt has been made aware of normal result and verbalized understanding.  jw

## 2021-03-17 ENCOUNTER — Telehealth (HOSPITAL_COMMUNITY): Payer: Self-pay | Admitting: Emergency Medicine

## 2021-03-17 NOTE — Telephone Encounter (Signed)
Reaching out to patient to offer assistance regarding upcoming cardiac imaging study; pt verbalizes understanding of appt date/time, parking situation and where to check in, pre-test NPO status and medications ordered, and verified current allergies; name and call back number provided for further questions should they arise Shawn Bond RN Navigator Cardiac Imaging [facility] [phone] office [phone] cell  Pts average HR 75 per zio patch, now medicated daily with 50mg  metop succ. New resting HR 55-65. Pt nervous to take additional BB for scan. States he will take 25mg  metop tart if HR >65 on Monday morning Denies iv issues Arrival time 1030a

## 2021-03-20 ENCOUNTER — Encounter (HOSPITAL_COMMUNITY): Payer: Self-pay

## 2021-03-20 ENCOUNTER — Ambulatory Visit (HOSPITAL_COMMUNITY)
Admission: RE | Admit: 2021-03-20 | Discharge: 2021-03-20 | Disposition: A | Discharge status: Home / Self Care | Payer: Federal, State, Local not specified - PPO | Source: Ambulatory Visit | Attending: Cardiology | Admitting: Cardiology

## 2021-03-20 ENCOUNTER — Other Ambulatory Visit: Payer: Self-pay

## 2021-03-20 DIAGNOSIS — Z789 Other specified health status: Secondary | ICD-10-CM | POA: Diagnosis not present

## 2021-03-20 DIAGNOSIS — I209 Angina pectoris, unspecified: Secondary | ICD-10-CM | POA: Diagnosis not present

## 2021-03-20 MED ORDER — METOPROLOL TARTRATE 5 MG/5ML IV SOLN
10.0000 mg | INTRAVENOUS | Status: DC | PRN
  Administered 2021-03-20: 5 mg via INTRAVENOUS

## 2021-03-20 MED ORDER — NITROGLYCERIN 0.4 MG SL SUBL: 0.8000 mg | SUBLINGUAL_TABLET | Freq: Once | SUBLINGUAL | Status: DC

## 2021-03-20 MED ORDER — NITROGLYCERIN 0.4 MG SL SUBL
SUBLINGUAL_TABLET | SUBLINGUAL | Status: AC
  Filled 2021-03-20: qty 2

## 2021-03-20 MED ORDER — METOPROLOL TARTRATE 5 MG/5ML IV SOLN
INTRAVENOUS | Status: AC
  Filled 2021-03-20: qty 5

## 2021-03-20 MED ORDER — IOHEXOL 350 MG/ML SOLN
100.0000 mL | Freq: Once | INTRAVENOUS | Status: AC | PRN
  Administered 2021-03-20: 100 mL via INTRAVENOUS

## 2021-03-21 ENCOUNTER — Telehealth: Payer: Self-pay | Admitting: *Deleted

## 2021-03-21 DIAGNOSIS — Z01812 Encounter for preprocedural laboratory examination: Secondary | ICD-10-CM

## 2021-03-21 DIAGNOSIS — I5189 Other ill-defined heart diseases: Secondary | ICD-10-CM

## 2021-03-21 MED ORDER — ASPIRIN EC 81 MG PO TBEC: 81.0000 mg | DELAYED_RELEASE_TABLET | Freq: Every day | ORAL | 3 refills | Status: AC

## 2021-03-21 NOTE — Telephone Encounter (Signed)
Dr Marlou Porch spoke with pt regarding his recent results and need for further testing (Cardiac MRI).  Order placed and pt aware he will be called to be scheduled.  Order also placed for CBC prior to testing if needed. He is to start ASA 81 mg a day. Order placed for referral to [facility].  Dr Marlou Porch left a message for Thurmond Butts regarding the need for the pt to be seen ASAP.  Pt is aware of the above per Dr Marlou Porch' conversation earlier today.

## 2021-03-21 NOTE — Telephone Encounter (Signed)
Left atrial mass suspicious for myxoma noted.  Minimal less than 24% stenosis proximal calcified LAD lesion.   Discussed with him on phone.  We will check a cardiac MRI   I will also set up an appointment with cardiothoracic surgery for resection.   [name], MD   Verbal order from Dr Marlou Porch also for pt to start ASA 81 mg daily.

## 2021-03-29 DIAGNOSIS — Z0000 Encounter for general adult medical examination without abnormal findings: Secondary | ICD-10-CM | POA: Diagnosis not present

## 2021-03-29 DIAGNOSIS — E7800 Pure hypercholesterolemia, unspecified: Secondary | ICD-10-CM | POA: Diagnosis not present

## 2021-03-29 DIAGNOSIS — Z8546 Personal history of malignant neoplasm of prostate: Secondary | ICD-10-CM | POA: Diagnosis not present

## 2021-04-11 ENCOUNTER — Telehealth (HOSPITAL_COMMUNITY): Payer: Self-pay | Admitting: Emergency Medicine

## 2021-04-11 ENCOUNTER — Encounter: Payer: Self-pay | Admitting: Cardiology

## 2021-04-11 ENCOUNTER — Other Ambulatory Visit (HOSPITAL_COMMUNITY): Payer: Self-pay | Admitting: Emergency Medicine

## 2021-04-11 DIAGNOSIS — I493 Ventricular premature depolarization: Secondary | ICD-10-CM

## 2021-04-11 DIAGNOSIS — I5189 Other ill-defined heart diseases: Secondary | ICD-10-CM

## 2021-04-11 DIAGNOSIS — D151 Benign neoplasm of heart: Secondary | ICD-10-CM

## 2021-04-11 DIAGNOSIS — I480 Paroxysmal atrial fibrillation: Secondary | ICD-10-CM

## 2021-04-11 DIAGNOSIS — I2510 Atherosclerotic heart disease of native coronary artery without angina pectoris: Secondary | ICD-10-CM

## 2021-04-11 MED ORDER — DIAZEPAM 5 MG PO TABS: 5.0000 mg | ORAL_TABLET | ORAL | 0 refills | Status: DC | PRN

## 2021-04-11 NOTE — Telephone Encounter (Signed)
Reaching out to patient to offer assistance regarding upcoming cardiac imaging study; pt verbalizes understanding of appt date/time, parking situation and where to check in, and verified current allergies; name and call back number provided for further questions should they arise Marchia Bond RN [place] [facility] [phone] office [phone] cell  Pt states he is claustro, obtained verbal from [facility] for 5mg  valium. Telephoned rx to patients pharm for 5mg  valium disp: 2, no refills. Take one tablet 30 mins prior to MR, bring additional dose with you to appt. Pt also states he recently had physical at PCP and will send labs to [facility] for review. Denies metal implants other than brachytherapy (radioactive seeds around prostate). Reports hes a difficult IV, needed US guidance during [facility] appt. Arrival time 7:30a, advise he WILL need a ride to/from appt due to controlled substances

## 2021-04-14 ENCOUNTER — Other Ambulatory Visit: Payer: Self-pay

## 2021-04-14 ENCOUNTER — Ambulatory Visit (HOSPITAL_COMMUNITY)
Admission: RE | Admit: 2021-04-14 | Discharge: 2021-04-14 | Disposition: A | Discharge status: Home / Self Care | Payer: Federal, State, Local not specified - PPO | Source: Ambulatory Visit | Attending: Cardiology | Admitting: Cardiology

## 2021-04-14 DIAGNOSIS — I5189 Other ill-defined heart diseases: Secondary | ICD-10-CM

## 2021-04-14 MED ORDER — GADOBUTROL 1 MMOL/ML IV SOLN
10.0000 mL | Freq: Once | INTRAVENOUS | Status: AC | PRN
  Administered 2021-04-14: 10 mL via INTRAVENOUS

## 2021-04-14 MED ORDER — LORAZEPAM 2 MG/ML IJ SOLN
INTRAMUSCULAR | Status: AC
  Filled 2021-04-14: qty 1

## 2021-04-18 ENCOUNTER — Encounter: Payer: Federal, State, Local not specified - PPO | Admitting: Thoracic Surgery (Cardiothoracic Vascular Surgery)

## 2021-04-19 ENCOUNTER — Other Ambulatory Visit: Payer: Self-pay | Admitting: *Deleted

## 2021-04-19 ENCOUNTER — Encounter: Payer: Self-pay | Admitting: Thoracic Surgery (Cardiothoracic Vascular Surgery)

## 2021-04-19 ENCOUNTER — Encounter: Payer: Self-pay | Admitting: *Deleted

## 2021-04-19 ENCOUNTER — Other Ambulatory Visit: Payer: Self-pay

## 2021-04-19 ENCOUNTER — Encounter: Payer: Self-pay | Admitting: Cardiology

## 2021-04-19 ENCOUNTER — Institutional Professional Consult (permissible substitution): Payer: Federal, State, Local not specified - PPO | Admitting: Thoracic Surgery (Cardiothoracic Vascular Surgery)

## 2021-04-19 VITALS — BP 137/84 | HR 71 | Resp 20 | Ht 70.0 in | Wt 260.0 lb

## 2021-04-19 DIAGNOSIS — I5189 Other ill-defined heart diseases: Secondary | ICD-10-CM

## 2021-04-19 DIAGNOSIS — D151 Benign neoplasm of heart: Secondary | ICD-10-CM

## 2021-04-19 DIAGNOSIS — I480 Paroxysmal atrial fibrillation: Secondary | ICD-10-CM

## 2021-04-19 DIAGNOSIS — I493 Ventricular premature depolarization: Secondary | ICD-10-CM

## 2021-04-19 DIAGNOSIS — I2510 Atherosclerotic heart disease of native coronary artery without angina pectoris: Secondary | ICD-10-CM

## 2021-04-19 HISTORY — DX: Paroxysmal atrial fibrillation: I48.0

## 2021-04-19 HISTORY — DX: Ventricular premature depolarization: I49.3

## 2021-04-19 HISTORY — DX: Atherosclerotic heart disease of native coronary artery without angina pectoris: I25.10

## 2021-04-19 HISTORY — DX: Benign neoplasm of heart: D15.1

## 2021-04-19 NOTE — H&P (View-Only) (Signed)
PCP is Antony Contras, MD Referring Provider is Jerline Pain, MD  Chief Complaint  Patient presents with   Atrial Mass    Surgical consult    HPI: Shawn Suarez sent for consultation regarding a left atrial myxoma.  Shawn Suarez is a 56 year old [employer] with a past history significant for paroxysmal atrial fibrillation, reflux, prostate cancer, obesity, and posttraumatic stress disorder.  He recently began noticing palpitations and shortness of breath.  He saw Dr. Marlou Porch.  An echocardiogram showed normal left ventricular function with ejection fraction of 60 to 65%.  There is no valvular pathology.  No mention was made of an atrial myxoma.  He then had a CT for coronary calcium scoring.  That showed no hemodynamically significant coronary lesions but did showed a 2.4 x 2.5 x 2.2 cm left atrial mass arising from the intra-atrial septum.  He then had a cardiac MRI which also showed the left atrial myxoma.    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   ZIO monitor 2022-symptomatic   GERD (gastroesophageal reflux disease)    Paroxysmal atrial fibrillation (HCC) 04/19/2021   ZIO monitor-2022- approximately 10 minutes atrial fibrillation rapid ventricular response    Past Surgical History:  Procedure Laterality Date   CHOLECYSTECTOMY      Family History  Problem Relation Age of Onset   Hypertension Mother    Diabetes Father    Heart attack Neg Hx    Hyperlipidemia Neg Hx    Sudden death Neg Hx     Social History Social History   Tobacco Use   Smoking status: Never   Smokeless tobacco: Current   Tobacco comments:    1 can per 1.5 weeks    Current Outpatient Medications   Medication Sig Dispense Refill   aspirin EC 81 MG tablet Take 1 tablet (81 mg total) by mouth daily. Swallow whole. 90 tablet 3   Cholecalciferol 25 MCG (1000 UT) tablet Take 1,000 Units by mouth daily.     Coenzyme Q10 100 MG capsule Take 100 mg by mouth daily.     diazepam (VALIUM) 5 MG tablet Take 1 tablet (5 mg total) by mouth as needed for up to 2 doses for anxiety. Take 30 mins prior to cardiac MRI, bring additional dose with you to appt. 2 tablet 0   metoprolol succinate (TOPROL XL) 25 MG 24 hr tablet Take 1 tablet (25 mg total) by mouth daily. 30 tablet 11   Multiple Vitamin (MULTIVITAMIN) tablet Take 1 tablet by mouth daily.     pantoprazole (PROTONIX) 20 MG tablet Take 20 mg by mouth daily.     metoprolol tartrate (LOPRESSOR) 50 MG tablet Take 1 tablet (50 mg total) by mouth once for 1 dose. Take one tablet (2) hours before your CT 1 tablet 0   No current facility-administered medications for this visit.    No Known Allergies  Review of Systems  Constitutional:  Positive for fatigue and unexpected weight change (Has gained weight). Negative for activity change.  HENT:  Negative for trouble swallowing and voice change.   Eyes:  Negative for visual disturbance.  Respiratory:  Positive for shortness of breath (With heavy exertion). Negative for wheezing.   Cardiovascular:  Positive for palpitations. Negative for chest pain and leg swelling.  Gastrointestinal:  Positive for abdominal pain.  Genitourinary:  Negative for difficulty urinating and dysuria.  Musculoskeletal:  Negative for arthralgias.  Neurological:  Negative for syncope and weakness.  Hematological:  Negative for adenopathy. Does not bruise/bleed easily.  All other systems reviewed and are negative.  BP 137/84    Pulse 71    Resp 20    Ht 5\' 10"  (1.778 m)    Wt 260 lb (117.9 kg)    SpO2 95% Comment: RA   BMI 37.31 kg/m  Physical Exam Vitals reviewed.  Constitutional:      General: He is not in acute distress.     Appearance: Normal appearance. He is obese.  HENT:     Head: Normocephalic and atraumatic.  Eyes:     General: No scleral icterus.    Extraocular Movements: Extraocular movements intact.  Neck:     Vascular: No carotid bruit.  Cardiovascular:     Rate and Rhythm: Normal rate and regular rhythm.     Heart sounds: Normal heart sounds. No murmur heard.   No friction rub. No gallop.  Pulmonary:     Effort: No respiratory distress.     Breath sounds: Normal breath sounds. No wheezing.  Abdominal:     General: There is no distension.     Palpations: Abdomen is soft.  Musculoskeletal:        General: No swelling.     Cervical back: Normal range of motion and neck supple.  Skin:    General: Skin is warm and dry.  Neurological:     General: No focal deficit present.     Mental Status: He is alert and oriented to person, place, and time.     Cranial Nerves: No cranial nerve deficit.     Motor: No weakness.    Diagnostic Tests: Cardiac/Coronary CTA   TECHNIQUE: The patient was scanned on a [employer].   PROTOCOL: A 120 kV prospective scan was triggered in the descending thoracic aorta at 111 HU's. Axial non-contrast 3 mm slices were carried out through the heart. The data set was analyzed on a dedicated work station and scored using the [place]. Gantry rotation speed was 250 msecs and collimation was .6 mm. Beta blockade and 0.8 mg of sl NTG was given. The 3D data set was reconstructed in 5% intervals of the 35-75 % of the R-R cycle. Diastolic phases were analyzed on a dedicated work station using MPR, MIP and VRT modes. The patient received 129mL OMNIPAQUE IOHEXOL 350 MG/ML SOLN of contrast.   FINDINGS: Quality: Good, HR 71   Coronary calcium score: The patient's coronary artery calcium score is 33, which places the patient in the 60th percentile.   Coronary arteries: Normal coronary origins.  Right dominance.   Right Coronary Artery: Dominant.  Normal  artery.   Left Main Coronary Artery: Normal. Bifurcates into the LAD and RCA arteries.   Left Anterior Descending Coronary Artery: Anterior vessel that reaches the apex. Minimal 1-24% ostial mixed stenosis (CADRADS1). No distal disease. 3 smaller OM branches without disease.   Left Circumflex Artery: AV groove LCx without disease. There is a mid-vessel OM branch which is large and without disease.   Aorta: Normal size, 37 mm at the mid ascending aorta (level of the PA bifurcation) measured double oblique. No calcifications. No dissection.   Aortic Valve: Trileaflet. No calcifications.   Other findings:   Normal pulmonary vein drainage  into the left atrium.   Normal left atrial appendage without a thrombus.   Normal size of the pulmonary artery.   Left atrial mass (24 mm x 22 mm), adjacent to the interatrial septum, non-obstructive, suspicious for myxoma   IMPRESSION: 1. Minimal mixed non-obstructive ostial LAD disease, CADRADS = 1.   2. Coronary calcium score of 33. This was 60th percentile for age and sex matched control.   3. Normal coronary origin with right dominance.   4. Left atrial mass (24 mm x 22 mm), adjacent to the interatrial septum, non-obstructive, suspicious for myxoma   5. Consider cMRI to further characterize left atrial mass as well as non-coronary causes of chest pain   Electronically Signed: By: Pixie Casino M.D. On: 03/20/2021 13:51   I personally reviewed the CT images.  There is a 2.2 x 2.4 x 2.5 cm left atrial tumor, probable myxoma.  Impression: Shawn Suarez is a 56 year old man with a past history significant for paroxysmal atrial fibrillation, reflux, prostate cancer, obesity, and posttraumatic stress disorder.  He recently presented with palpitations.  Work-up revealed a left atrial mass consistent with a myxoma.  I reviewed the images with Shawn Suarez.  We discussed the treatment of an atrial myxoma is resection.  Regardless of what type  of tumor it is the treatment would be resection.  This would be done with a biatrial approach.  He strongly desires to have it done via a right minithoracotomy rather than sternotomy.  We discussed the general nature of the procedure including the incisions to be used, the use of cardiopulmonary bypass, the use of drainage tubes postoperatively, the expected hospital stay, and the overall recovery.  I informed him of the indications, risks, benefits, and alternatives.  He understands the risks include, but not limited to death, MI, stroke, DVT, PE, bleeding, possible need for transfusion, infection, cardiac arrhythmias, embolism of tumor fragments, as well as possibility of other unforeseeable complications.  We also discussed the possibility of doing a pulmonary vein isolation with cryoablation.  I will discuss that further with Dr. Marlou Porch.  He understands and accepts the risks and wishes to proceed.  He wishes to wait until February 1 to have the procedure done.  Plan: Resection of left atrial myxoma and pulmonary vein isolation (cryo maze) via right thoracotomy on 05/10/2021  Melrose Nakayama, MD [facility] [phone]

## 2021-04-19 NOTE — Progress Notes (Signed)
PCP is Antony Contras, MD Referring Provider is Jerline Pain, MD  Chief Complaint  Patient presents with   Atrial Mass    Surgical consult    HPI: Shawn Suarez sent for consultation regarding a left atrial myxoma.  Shawn Suarez is a 56 year old [employer] with a past history significant for paroxysmal atrial fibrillation, reflux, prostate cancer, obesity, and posttraumatic stress disorder.  He recently began noticing palpitations and shortness of breath.  He saw Dr. Marlou Porch.  An echocardiogram showed normal left ventricular function with ejection fraction of 60 to 65%.  There is no valvular pathology.  No mention was made of an atrial myxoma.  He then had a CT for coronary calcium scoring.  That showed no hemodynamically significant coronary lesions but did showed a 2.4 x 2.5 x 2.2 cm left atrial mass arising from the intra-atrial septum.  He then had a cardiac MRI which also showed the left atrial myxoma.    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   ZIO monitor 2022-symptomatic   GERD (gastroesophageal reflux disease)    Paroxysmal atrial fibrillation (HCC) 04/19/2021   ZIO monitor-2022- approximately 10 minutes atrial fibrillation rapid ventricular response    Past Surgical History:  Procedure Laterality Date   CHOLECYSTECTOMY      Family History  Problem Relation Age of Onset   Hypertension Mother    Diabetes Father    Heart attack Neg Hx    Hyperlipidemia Neg Hx    Sudden death Neg Hx     Social History Social History   Tobacco Use   Smoking status: Never   Smokeless tobacco: Current   Tobacco comments:    1 can per 1.5 weeks    Current Outpatient Medications   Medication Sig Dispense Refill   aspirin EC 81 MG tablet Take 1 tablet (81 mg total) by mouth daily. Swallow whole. 90 tablet 3   Cholecalciferol 25 MCG (1000 UT) tablet Take 1,000 Units by mouth daily.     Coenzyme Q10 100 MG capsule Take 100 mg by mouth daily.     diazepam (VALIUM) 5 MG tablet Take 1 tablet (5 mg total) by mouth as needed for up to 2 doses for anxiety. Take 30 mins prior to cardiac MRI, bring additional dose with you to appt. 2 tablet 0   metoprolol succinate (TOPROL XL) 25 MG 24 hr tablet Take 1 tablet (25 mg total) by mouth daily. 30 tablet 11   Multiple Vitamin (MULTIVITAMIN) tablet Take 1 tablet by mouth daily.     pantoprazole (PROTONIX) 20 MG tablet Take 20 mg by mouth daily.     metoprolol tartrate (LOPRESSOR) 50 MG tablet Take 1 tablet (50 mg total) by mouth once for 1 dose. Take one tablet (2) hours before your CT 1 tablet 0   No current facility-administered medications for this visit.    No Known Allergies  Review of Systems  Constitutional:  Positive for fatigue and unexpected weight change (Has gained weight). Negative for activity change.  HENT:  Negative for trouble swallowing and voice change.   Eyes:  Negative for visual disturbance.  Respiratory:  Positive for shortness of breath (With heavy exertion). Negative for wheezing.   Cardiovascular:  Positive for palpitations. Negative for chest pain and leg swelling.  Gastrointestinal:  Positive for abdominal pain.  Genitourinary:  Negative for difficulty urinating and dysuria.  Musculoskeletal:  Negative for arthralgias.  Neurological:  Negative for syncope and weakness.  Hematological:  Negative for adenopathy. Does not bruise/bleed easily.  All other systems reviewed and are negative.  BP 137/84    Pulse 71    Resp 20    Ht 5\' 10"  (1.778 m)    Wt 260 lb (117.9 kg)    SpO2 95% Comment: RA   BMI 37.31 kg/m  Physical Exam Vitals reviewed.  Constitutional:      General: He is not in acute distress.     Appearance: Normal appearance. He is obese.  HENT:     Head: Normocephalic and atraumatic.  Eyes:     General: No scleral icterus.    Extraocular Movements: Extraocular movements intact.  Neck:     Vascular: No carotid bruit.  Cardiovascular:     Rate and Rhythm: Normal rate and regular rhythm.     Heart sounds: Normal heart sounds. No murmur heard.   No friction rub. No gallop.  Pulmonary:     Effort: No respiratory distress.     Breath sounds: Normal breath sounds. No wheezing.  Abdominal:     General: There is no distension.     Palpations: Abdomen is soft.  Musculoskeletal:        General: No swelling.     Cervical back: Normal range of motion and neck supple.  Skin:    General: Skin is warm and dry.  Neurological:     General: No focal deficit present.     Mental Status: He is alert and oriented to person, place, and time.     Cranial Nerves: No cranial nerve deficit.     Motor: No weakness.    Diagnostic Tests: Cardiac/Coronary CTA   TECHNIQUE: The patient was scanned on a [employer].   PROTOCOL: A 120 kV prospective scan was triggered in the descending thoracic aorta at 111 HU's. Axial non-contrast 3 mm slices were carried out through the heart. The data set was analyzed on a dedicated work station and scored using the [place]. Gantry rotation speed was 250 msecs and collimation was .6 mm. Beta blockade and 0.8 mg of sl NTG was given. The 3D data set was reconstructed in 5% intervals of the 35-75 % of the R-R cycle. Diastolic phases were analyzed on a dedicated work station using MPR, MIP and VRT modes. The patient received 165mL OMNIPAQUE IOHEXOL 350 MG/ML SOLN of contrast.   FINDINGS: Quality: Good, HR 71   Coronary calcium score: The patient's coronary artery calcium score is 33, which places the patient in the 60th percentile.   Coronary arteries: Normal coronary origins.  Right dominance.   Right Coronary Artery: Dominant.  Normal  artery.   Left Main Coronary Artery: Normal. Bifurcates into the LAD and RCA arteries.   Left Anterior Descending Coronary Artery: Anterior vessel that reaches the apex. Minimal 1-24% ostial mixed stenosis (CADRADS1). No distal disease. 3 smaller OM branches without disease.   Left Circumflex Artery: AV groove LCx without disease. There is a mid-vessel OM branch which is large and without disease.   Aorta: Normal size, 37 mm at the mid ascending aorta (level of the PA bifurcation) measured double oblique. No calcifications. No dissection.   Aortic Valve: Trileaflet. No calcifications.   Other findings:   Normal pulmonary vein drainage  into the left atrium.   Normal left atrial appendage without a thrombus.   Normal size of the pulmonary artery.   Left atrial mass (24 mm x 22 mm), adjacent to the interatrial septum, non-obstructive, suspicious for myxoma   IMPRESSION: 1. Minimal mixed non-obstructive ostial LAD disease, CADRADS = 1.   2. Coronary calcium score of 33. This was 60th percentile for age and sex matched control.   3. Normal coronary origin with right dominance.   4. Left atrial mass (24 mm x 22 mm), adjacent to the interatrial septum, non-obstructive, suspicious for myxoma   5. Consider cMRI to further characterize left atrial mass as well as non-coronary causes of chest pain   Electronically Signed: By: Pixie Casino M.D. On: 03/20/2021 13:51   I personally reviewed the CT images.  There is a 2.2 x 2.4 x 2.5 cm left atrial tumor, probable myxoma.  Impression: Mr. Temme is a 56 year old man with a past history significant for paroxysmal atrial fibrillation, reflux, prostate cancer, obesity, and posttraumatic stress disorder.  He recently presented with palpitations.  Work-up revealed a left atrial mass consistent with a myxoma.  I reviewed the images with Mr. Hoogendoorn.  We discussed the treatment of an atrial myxoma is resection.  Regardless of what type  of tumor it is the treatment would be resection.  This would be done with a biatrial approach.  He strongly desires to have it done via a right minithoracotomy rather than sternotomy.  We discussed the general nature of the procedure including the incisions to be used, the use of cardiopulmonary bypass, the use of drainage tubes postoperatively, the expected hospital stay, and the overall recovery.  I informed him of the indications, risks, benefits, and alternatives.  He understands the risks include, but not limited to death, MI, stroke, DVT, PE, bleeding, possible need for transfusion, infection, cardiac arrhythmias, embolism of tumor fragments, as well as possibility of other unforeseeable complications.  We also discussed the possibility of doing a pulmonary vein isolation with cryoablation.  I will discuss that further with Dr. Marlou Porch.  He understands and accepts the risks and wishes to proceed.  He wishes to wait until February 1 to have the procedure done.  Plan: Resection of left atrial myxoma and pulmonary vein isolation (cryo maze) via right thoracotomy on 05/10/2021  Melrose Nakayama, MD [facility] [phone]

## 2021-04-25 ENCOUNTER — Encounter: Payer: Self-pay | Admitting: *Deleted

## 2021-05-05 NOTE — Progress Notes (Signed)
Surgical Instructions    Your procedure is scheduled on Wednesday, February 1st, 2023.   Report to [facility] Main Entrance "A" at 06:30 A.M., then check in with the Admitting office.  Call this number if you have problems the morning of surgery:  [phone]   If you have any questions prior to your surgery date call [phone]: Open Monday-Friday 8am-4pm    Remember:  Do not eat or drink after midnight the night before your surgery     Take these medicines the morning of surgery with A SIP OF WATER:  metoprolol succinate (TOPROL XL)  pantoprazole (PROTONIX)  mometasone-formoterol (DULERA) - if needed   Follow your surgeon's instructions on when to stop Aspirin.  If no instructions were given by your surgeon then you will need to call the office to get those instructions.     As of today, STOP taking any Aspirin (unless otherwise instructed by your surgeon) Aleve, Naproxen, Ibuprofen, Motrin, Advil, Goody's, BC's, all herbal medications, fish oil, and all vitamins.  After your COVID test   You are not required to quarantine however you are required to wear a well-fitting mask when you are out and around people not in your household.  If your mask becomes wet or soiled, replace with a new one.  Wash your hands often with soap and water for 20 seconds or clean your hands with an alcohol-based hand sanitizer that contains at least 60% alcohol.  Do not share personal items.  Notify your provider: if you are in close contact with someone who has COVID  or if you develop a fever of 100.4 or greater, sneezing, cough, sore throat, shortness of breath or body aches.    The day of surgery:          Do not wear jewelry  Do not wear lotions, powders, colognes, or deodorant. Men may shave face and neck. Do not bring valuables to the hospital.              [facility] is not responsible for any belongings or valuables.  Do NOT Smoke (Tobacco/Vaping)  24 hours prior to your  procedure  If you use a CPAP at night, you may bring your mask for your overnight stay.   Contacts, glasses, hearing aids, dentures or partials may not be worn into surgery, please bring cases for these belongings   For patients admitted to the hospital, discharge time will be determined by your treatment team.   Patients discharged the day of surgery will not be allowed to drive home, and someone needs to stay with them for 24 hours.  NO VISITORS WILL BE ALLOWED IN PRE-OP WHERE PATIENTS ARE PREPPED FOR SURGERY.  ONLY 1 SUPPORT PERSON MAY BE PRESENT IN THE WAITING ROOM WHILE YOU ARE IN SURGERY.  IF YOU ARE TO BE ADMITTED, ONCE YOU ARE IN YOUR ROOM YOU WILL BE ALLOWED TWO (2) VISITORS. 1 (ONE) VISITOR MAY STAY OVERNIGHT BUT MUST ARRIVE TO THE ROOM BY 8pm.  Minor children may have two parents present. Special consideration for safety and communication needs will be reviewed on a case by case basis.  Special instructions:    Oral Hygiene is also important to reduce your risk of infection.  Remember - BRUSH YOUR TEETH THE MORNING OF SURGERY WITH YOUR REGULAR TOOTHPASTE   [facility]- Preparing For Surgery  Before surgery, you can play an important role. Because skin is not sterile, your skin needs to be as free of germs as possible.  You can reduce the number of germs on your skin by washing with CHG (chlorahexidine gluconate) Soap before surgery.  CHG is an antiseptic cleaner which kills germs and bonds with the skin to continue killing germs even after washing.     Please do not use if you have an allergy to CHG or antibacterial soaps. If your skin becomes reddened/irritated stop using the CHG.  Do not shave (including legs and underarms) for at least 48 hours prior to first CHG shower. It is OK to shave your face.  Please follow these instructions carefully.     Shower the NIGHT BEFORE SURGERY and the MORNING OF SURGERY with CHG Soap.   If you chose to wash your hair, wash your hair first  as usual with your normal shampoo. After you shampoo, rinse your hair and body thoroughly to remove the shampoo.  Then [employer] and genitals (private parts) with your normal soap and rinse thoroughly to remove soap.  After that Use CHG Soap as you would any other liquid soap. You can apply CHG directly to the skin and wash gently with a scrungie or a clean washcloth.   Apply the CHG Soap to your body ONLY FROM THE NECK DOWN.  Do not use on open wounds or open sores. Avoid contact with your eyes, ears, mouth and genitals (private parts). Wash Face and genitals (private parts)  with your normal soap.   Wash thoroughly, paying special attention to the area where your surgery will be performed.  Thoroughly rinse your body with warm water from the neck down.  DO NOT shower/wash with your normal soap after using and rinsing off the CHG Soap.  Pat yourself dry with a CLEAN TOWEL.  Wear CLEAN PAJAMAS to bed the night before surgery  Place CLEAN SHEETS on your bed the night before your surgery  DO NOT SLEEP WITH PETS.   Day of Surgery:  Take a shower with CHG soap. Wear Clean/Comfortable clothing the morning of surgery Do not apply any deodorants/lotions.   Remember to brush your teeth WITH YOUR REGULAR TOOTHPASTE.   Please read over the following fact sheets that you were given.

## 2021-05-08 ENCOUNTER — Other Ambulatory Visit: Payer: Self-pay

## 2021-05-08 ENCOUNTER — Encounter (HOSPITAL_COMMUNITY)
Admission: RE | Admit: 2021-05-08 | Discharge: 2021-05-08 | Disposition: A | Discharge status: Home / Self Care | Payer: Federal, State, Local not specified - PPO | Source: Ambulatory Visit | Attending: Thoracic Surgery (Cardiothoracic Vascular Surgery) | Admitting: Thoracic Surgery (Cardiothoracic Vascular Surgery)

## 2021-05-08 ENCOUNTER — Encounter (HOSPITAL_COMMUNITY): Payer: Self-pay

## 2021-05-08 ENCOUNTER — Ambulatory Visit (HOSPITAL_COMMUNITY)
Admission: RE | Admit: 2021-05-08 | Discharge: 2021-05-08 | Disposition: A | Discharge status: Home / Self Care | Payer: Federal, State, Local not specified - PPO | Source: Ambulatory Visit | Attending: Thoracic Surgery (Cardiothoracic Vascular Surgery) | Admitting: Thoracic Surgery (Cardiothoracic Vascular Surgery)

## 2021-05-08 VITALS — BP 128/77 | HR 65 | Temp 98.4°F | Resp 18 | Ht 70.0 in | Wt 259.9 lb

## 2021-05-08 DIAGNOSIS — I493 Ventricular premature depolarization: Secondary | ICD-10-CM | POA: Insufficient documentation

## 2021-05-08 DIAGNOSIS — Z01818 Encounter for other preprocedural examination: Secondary | ICD-10-CM | POA: Insufficient documentation

## 2021-05-08 DIAGNOSIS — T801XXA Vascular complications following infusion, transfusion and therapeutic injection, initial encounter: Secondary | ICD-10-CM | POA: Diagnosis not present

## 2021-05-08 DIAGNOSIS — I480 Paroxysmal atrial fibrillation: Secondary | ICD-10-CM | POA: Insufficient documentation

## 2021-05-08 DIAGNOSIS — Y838 Other surgical procedures as the cause of abnormal reaction of the patient, or of later complication, without mention of misadventure at the time of the procedure: Secondary | ICD-10-CM | POA: Diagnosis not present

## 2021-05-08 DIAGNOSIS — D62 Acute posthemorrhagic anemia: Secondary | ICD-10-CM | POA: Diagnosis not present

## 2021-05-08 DIAGNOSIS — D151 Benign neoplasm of heart: Secondary | ICD-10-CM | POA: Insufficient documentation

## 2021-05-08 DIAGNOSIS — Y92234 Operating room of hospital as the place of occurrence of the external cause: Secondary | ICD-10-CM | POA: Diagnosis not present

## 2021-05-08 DIAGNOSIS — I472 Ventricular tachycardia, unspecified: Secondary | ICD-10-CM | POA: Diagnosis not present

## 2021-05-08 DIAGNOSIS — I9751 Accidental puncture and laceration of a circulatory system organ or structure during a circulatory system procedure: Secondary | ICD-10-CM | POA: Diagnosis not present

## 2021-05-08 DIAGNOSIS — E8779 Other fluid overload: Secondary | ICD-10-CM | POA: Diagnosis not present

## 2021-05-08 DIAGNOSIS — J9811 Atelectasis: Secondary | ICD-10-CM | POA: Diagnosis not present

## 2021-05-08 DIAGNOSIS — K649 Unspecified hemorrhoids: Secondary | ICD-10-CM | POA: Diagnosis not present

## 2021-05-08 DIAGNOSIS — K219 Gastro-esophageal reflux disease without esophagitis: Secondary | ICD-10-CM | POA: Insufficient documentation

## 2021-05-08 DIAGNOSIS — I4819 Other persistent atrial fibrillation: Secondary | ICD-10-CM | POA: Diagnosis not present

## 2021-05-08 DIAGNOSIS — I2510 Atherosclerotic heart disease of native coronary artery without angina pectoris: Secondary | ICD-10-CM | POA: Insufficient documentation

## 2021-05-08 DIAGNOSIS — R918 Other nonspecific abnormal finding of lung field: Secondary | ICD-10-CM | POA: Diagnosis not present

## 2021-05-08 DIAGNOSIS — Z6837 Body mass index (BMI) 37.0-37.9, adult: Secondary | ICD-10-CM | POA: Insufficient documentation

## 2021-05-08 DIAGNOSIS — E669 Obesity, unspecified: Secondary | ICD-10-CM | POA: Insufficient documentation

## 2021-05-08 DIAGNOSIS — Y713 Surgical instruments, materials and cardiovascular devices (including sutures) associated with adverse incidents: Secondary | ICD-10-CM | POA: Diagnosis not present

## 2021-05-08 DIAGNOSIS — Z20822 Contact with and (suspected) exposure to covid-19: Secondary | ICD-10-CM | POA: Insufficient documentation

## 2021-05-08 DIAGNOSIS — I5189 Other ill-defined heart diseases: Secondary | ICD-10-CM

## 2021-05-08 DIAGNOSIS — D696 Thrombocytopenia, unspecified: Secondary | ICD-10-CM | POA: Diagnosis not present

## 2021-05-08 HISTORY — DX: Malignant (primary) neoplasm, unspecified: C80.1

## 2021-05-08 HISTORY — DX: Cardiac arrhythmia, unspecified: I49.9

## 2021-05-08 LAB — BLOOD GAS, ARTERIAL
Acid-base deficit: 0.7 mmol/L (ref 0.0–2.0)
Bicarbonate: 23.3 mmol/L (ref 20.0–28.0)
Drawn by: 60286
FIO2: 21
O2 Saturation: 97 %
Patient temperature: 37
pCO2 arterial: 37 mmHg (ref 32.0–48.0)
pH, Arterial: 7.415 (ref 7.350–7.450)
pO2, Arterial: 85.4 mmHg (ref 83.0–108.0)

## 2021-05-08 LAB — COMPREHENSIVE METABOLIC PANEL
ALT: 53 U/L — ABNORMAL HIGH (ref 0–44)
AST: 37 U/L (ref 15–41)
Albumin: 4.1 g/dL (ref 3.5–5.0)
Alkaline Phosphatase: 77 U/L (ref 38–126)
Anion gap: 10 (ref 5–15)
BUN: 11 mg/dL (ref 6–20)
CO2: 20 mmol/L — ABNORMAL LOW (ref 22–32)
Calcium: 9.3 mg/dL (ref 8.9–10.3)
Chloride: 106 mmol/L (ref 98–111)
Creatinine, Ser: 1.2 mg/dL (ref 0.61–1.24)
GFR, Estimated: >60 mL/min (ref >60)
Glucose, Bld: 116 mg/dL — ABNORMAL HIGH (ref 70–99)
Potassium: 4.3 mmol/L (ref 3.5–5.1)
Sodium: 136 mmol/L (ref 135–145)
Total Bilirubin: 1.2 mg/dL (ref 0.3–1.2)
Total Protein: 7.5 g/dL (ref 6.5–8.1)

## 2021-05-08 LAB — URINALYSIS, ROUTINE W REFLEX MICROSCOPIC
Bilirubin Urine: NEGATIVE
Glucose, UA: NEGATIVE mg/dL
Hgb urine dipstick: NEGATIVE
Ketones, ur: NEGATIVE mg/dL
Leukocytes,Ua: NEGATIVE
Nitrite: NEGATIVE
Protein, ur: NEGATIVE mg/dL
Specific Gravity, Urine: 1.015 (ref 1.005–1.030)
pH: 6 (ref 5.0–8.0)

## 2021-05-08 LAB — APTT: aPTT: 29 seconds (ref 24–36)

## 2021-05-08 LAB — HEMOGLOBIN A1C
Hgb A1c MFr Bld: 6.3 % — ABNORMAL HIGH (ref 4.8–5.6)
Mean Plasma Glucose: 134.11 mg/dL

## 2021-05-08 LAB — SURGICAL PCR SCREEN
MRSA, PCR: NEGATIVE
Staphylococcus aureus: NEGATIVE

## 2021-05-08 LAB — CBC
HCT: 48.1 % (ref 39.0–52.0)
Hemoglobin: 16 g/dL (ref 13.0–17.0)
MCH: 28.7 pg (ref 26.0–34.0)
MCHC: 33.3 g/dL (ref 30.0–36.0)
MCV: 86.4 fL (ref 80.0–100.0)
Platelets: 321 10*3/uL (ref 150–400)
RBC: 5.57 MIL/uL (ref 4.22–5.81)
RDW: 13.3 % (ref 11.5–15.5)
WBC: 7.5 10*3/uL (ref 4.0–10.5)
nRBC: 0 % (ref 0.0–0.2)

## 2021-05-08 LAB — SARS CORONAVIRUS 2 (TAT 6-24 HRS): SARS Coronavirus 2: NEGATIVE

## 2021-05-08 LAB — PROTIME-INR
INR: 0.9 (ref 0.8–1.2)
Prothrombin Time: 11.7 seconds (ref 11.4–15.2)

## 2021-05-08 NOTE — Progress Notes (Signed)
Anesthesia Chart Review:  Case: [id number] Date/Time: 05/10/21 0815   Procedures:      THORACOTOMY (Right)     RESECTION OF ATRIAL MYXOMA     PARTIAL CRYOMAZE     TRANSESOPHAGEAL ECHOCARDIOGRAM (TEE)   Anesthesia type: General   Pre-op diagnosis: LEFT ATRIAL MYXOMA   Location: [facility] OR ROOM 81 / [facility] OR   Surgeons: Melrose Nakayama, MD       DISCUSSION: Patient is a 56 year old male scheduled for the above procedure.  History includes never smoker, atrial myxoma, dysrhythmia (PAF, frequent PACs/PVCs 02/2021 Zio monitor), coronary artery calcifications (mild non-obstructive LAD 03/2021 CCTA), GERD, cholecystectomy.  BMI is consistent with obesity.  He was first evaluated by cardiologist Dr. Marlou Porch on 02/21/2021 for rapid heart rate/palpitations following exercise and shortness of breath.  Work-up included an echo, Zio patch cardiac monitoring and cardiac CT.  Echo showed normal EF, Zio showed SR with frequent PACs/PVCs, brief run of VT and ~ 10 minutes of afib. CCTA showed mild proximal LAD stenosis and possible myxoma. Cardiac MRI confirmed myxoma, and he was referred to CT surgery. The above procedures recommended.   05/08/2021 presurgical COVID-19 test negative.  Anesthesia team to evaluate on the day of surgery.   VS: BP 128/77    Pulse 65    Temp 36.9 C (Oral)    Resp 18    Ht 5\' 10"  (1.778 m)    Wt 117.9 kg    SpO2 97%    BMI 37.29 kg/m    PROVIDERS: Antony Contras, MD is PCP  [name], MD is cardiologist   LABS: Labs reviewed: Acceptable for surgery. (all labs ordered are listed, but only abnormal results are displayed)  Labs Reviewed  COMPREHENSIVE METABOLIC PANEL - Abnormal; Notable for the following components:      Result Value   CO2 20 (*)    Glucose, Bld 116 (*)    ALT 53 (*)    All other components within normal limits  HEMOGLOBIN A1C - Abnormal; Notable for the following components:   Hgb A1c MFr Bld 6.3 (*)    All other components within normal limits  SARS  CORONAVIRUS 2 (TAT 6-24 HRS)  SURGICAL PCR SCREEN  CBC  PROTIME-INR  APTT  URINALYSIS, ROUTINE W REFLEX MICROSCOPIC  BLOOD GAS, ARTERIAL  TYPE AND SCREEN     IMAGES: CXR 05/08/21: FINDINGS: - Cardiomediastinal silhouette unchanged in size and contour. No evidence of central vascular congestion. No interlobular septal thickening. - No pneumothorax or pleural effusion. Coarsened interstitial markings, with no confluent airspace disease. - No acute displaced fracture. Degenerative changes of the spine. IMPRESSION: No active cardiopulmonary disease.  CT Chest 03/20/21: FINDINGS: Minimal streaky volume loss in the lung bases. No acute extracardiac findings. IMPRESSION: No acute extracardiac findings.   EKG: 05/08/21: Normal sinus rhythm Low voltage QRS Cannot rule out Anterior infarct , age undetermined   CV: MRI Cardiac 04/14/21: IMPRESSION: - There is evidence of a late atrial myxoma. - Thre is basal anterior mid myocardial LGE. Though not consistent with cardiac sarcoidosis, may be a foci for PVCs.   CT Coronary 03/20/21: IMPRESSION: 1. Minimal mixed non-obstructive ostial LAD disease, CADRADS = 1. 2. Coronary calcium score of 33. This was 60th percentile for age and sex matched control. 3. Normal coronary origin with right dominance. 4. Left atrial mass (24 mm x 22 mm), adjacent to the interatrial septum, non-obstructive, suspicious for myxoma 5. Consider cMRI to further characterize left atrial mass as well as non-coronary  causes of chest pain    Echo 03/10/21: IMPRESSIONS   1. Left ventricular ejection fraction, by estimation, is 60 to 65%. The  left ventricle has normal function. The left ventricle has no regional  wall motion abnormalities. There is mild left ventricular hypertrophy.  Left ventricular diastolic parameters  were normal.   2. Right ventricular systolic function is normal. The right ventricular  size is normal. Tricuspid regurgitation  signal is inadequate for assessing  PA pressure.   3. The mitral valve is normal in structure. Trivial mitral valve  regurgitation. No evidence of mitral stenosis.   4. The aortic valve is grossly normal. Aortic valve regurgitation is not  visualized. No aortic stenosis is present.   5. The inferior vena cava is normal in size with greater than 50%  respiratory variability, suggesting right atrial pressure of 3 mmHg.    Long term cardiac monitor 02/21/21-03/07/21: Study Highlights Sinus rhythm, average heartbeat 75 bpm. 15 atrial tachycardia runs longest 15 beats with an average of 116 bpm. Atrial bigeminy noted, PVCs, couplets, triplets noted Brief episode of atrial fibrillation with rapid ventricular response noted 9 minutes and 15 seconds with heart rate ranging 132 to 213 bpm. Average was 168. During that episode there were a few short runs of wide-complex tachycardia noted, some appeared to be following a long short pattern of aberancy. There was another episode on 03/06/2021 at 11:30 AM while in normal sinus rhythm with frequent PACs, PVCs, triplets in rapid sequence. Brief episodes of ventricular tachycardia.   Awaiting coronary CT scan for further analysis of coronary arteries. (Will expedite auth) Overall ejection fraction normal on echocardiogram. Toprol-XL 25 mg initiated with decrease sensation of palpitations. Once again, the sensation of exercise-induced shortness of breath, fluttering just recently started a few months ago. Discussed findings with him on the phone. If CT is unremarkable, will check Cardiac MRI. After data is gathered, EP consult.  [name], MD   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   Detected on coronary CT scan/MRI 2022   Cancer 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   Last ASA planned for 1/31//23.   Myra Gianotti, PA-C Surgical Short Stay/Anesthesiology [facility] Phone [phone] [facility] Phone [phone] 05/08/2021 4:27 PM

## 2021-05-08 NOTE — Anesthesia Preprocedure Evaluation (Addendum)
Anesthesia Evaluation  Patient identified by MRN, date of birth, ID band Patient awake    Reviewed: Allergy & Precautions, NPO status , Patient's Chart, lab work & pertinent test results  Airway Mallampati: II  TM Distance: >3 FB Neck ROM: Full    Dental  (+) Dental Advisory Given   Pulmonary shortness of breath,    breath sounds clear to auscultation       Cardiovascular hypertension, Pt. on home beta blockers + CAD  + dysrhythmias  Rhythm:Regular Rate:Normal  Left atrial myxoma   Neuro/Psych negative neurological ROS     GI/Hepatic Neg liver ROS, GERD  ,  Endo/Other  negative endocrine ROS  Renal/GU negative Renal ROS     Musculoskeletal   Abdominal   Peds  Hematology negative hematology ROS (+)   Anesthesia Other Findings   Reproductive/Obstetrics                             Lab Results  Component Value Date   WBC 7.5 05/08/2021   HGB 16.0 05/08/2021   HCT 48.1 05/08/2021   MCV 86.4 05/08/2021   PLT 321 05/08/2021   Lab Results  Component Value Date   CREATININE 1.20 05/08/2021   BUN 11 05/08/2021   NA 136 05/08/2021   K 4.3 05/08/2021   CL 106 05/08/2021   CO2 20 (L) 05/08/2021    Anesthesia Physical Anesthesia Plan  ASA: 3  Anesthesia Plan: General   Post-op Pain Management: Tylenol PO (pre-op)   Induction: Intravenous  PONV Risk Score and Plan: 2 and Dexamethasone, Ondansetron and Treatment may vary due to age or medical condition  Airway Management Planned: Double Lumen EBT  Additional Equipment: Arterial line, CVP, PA Cath, TEE and Ultrasound Guidance Line Placement  Intra-op Plan:   Post-operative Plan: Post-operative intubation/ventilation and Possible Post-op intubation/ventilation  Informed Consent: I have reviewed the patients History and Physical, chart, labs and discussed the procedure including the risks, benefits and alternatives for the  proposed anesthesia with the patient or authorized representative who has indicated his/her understanding and acceptance.     Dental advisory given  Plan Discussed with: CRNA  Anesthesia Plan Comments: ( )      Anesthesia Quick Evaluation

## 2021-05-08 NOTE — Progress Notes (Signed)
PCP - Antony Contras, MD Cardiologist - [name], MD  PPM/ICD - denies Device Orders - n/a Rep Notified - n/a  Chest x-ray - 05/08/2021 EKG - 05/08/2021 Stress Test - denies ECHO - 03/10/2021 Cardiac Cath - denies  Sleep Study - denies CPAP - n/a  Fasting Blood Sugar - n/a  Blood Thinner Instructions: n/a  Aspirin Instructions: Patient will hold Aspirin the day of surgery per MD  Patient was instructed: As of today, STOP taking any Aspirin (unless otherwise instructed by your surgeon) Aleve, Naproxen, Ibuprofen, Motrin, Advil, Goody's, BC's, all herbal medications, fish oil, and all vitamins.    ERAS Protcol - n/a  COVID TEST- done in PAT on 05/08/2021   Anesthesia review: yes - cardiac history  Patient denies shortness of breath, fever, cough and chest pain at PAT appointment   All instructions explained to the patient, with a verbal understanding of the material. Patient agrees to go over the instructions while at home for a better understanding. Patient also instructed to self quarantine after being tested for COVID-19. The opportunity to ask questions was provided.

## 2021-05-09 MED ORDER — MAGNESIUM SULFATE 50 % IJ SOLN
40.0000 meq | INTRAMUSCULAR | Status: DC
  Filled 2021-05-09: qty 9.85

## 2021-05-09 MED ORDER — NITROGLYCERIN IN D5W 200-5 MCG/ML-% IV SOLN
2.0000 ug/min | INTRAVENOUS | Status: DC
  Filled 2021-05-09: qty 250

## 2021-05-09 MED ORDER — PHENYLEPHRINE HCL-NACL 20-0.9 MG/250ML-% IV SOLN
30.0000 ug/min | INTRAVENOUS | Status: AC
  Administered 2021-05-10: 25 ug/min via INTRAVENOUS
  Filled 2021-05-09: qty 250

## 2021-05-09 MED ORDER — NOREPINEPHRINE 4 MG/250ML-% IV SOLN
0.0000 ug/min | INTRAVENOUS | Status: DC
  Filled 2021-05-09: qty 250

## 2021-05-09 MED ORDER — POTASSIUM CHLORIDE 2 MEQ/ML IV SOLN
80.0000 meq | INTRAVENOUS | Status: DC
  Filled 2021-05-09: qty 40

## 2021-05-09 MED ORDER — TRANEXAMIC ACID (OHS) PUMP PRIME SOLUTION
2.0000 mg/kg | INTRAVENOUS | Status: DC
  Filled 2021-05-09: qty 2.36

## 2021-05-09 MED ORDER — PLASMA-LYTE A IV SOLN
INTRAVENOUS | Status: DC
  Filled 2021-05-09: qty 5

## 2021-05-09 MED ORDER — VANCOMYCIN HCL 1500 MG/300ML IV SOLN
1500.0000 mg | INTRAVENOUS | Status: AC
  Administered 2021-05-10: 1500 mg via INTRAVENOUS
  Filled 2021-05-09: qty 300

## 2021-05-09 MED ORDER — HEPARIN 30,000 UNITS/1000 ML (OHS) CELLSAVER SOLUTION
Status: DC
  Filled 2021-05-09: qty 1000

## 2021-05-09 MED ORDER — CEFAZOLIN SODIUM-DEXTROSE 2-4 GM/100ML-% IV SOLN
2.0000 g | INTRAVENOUS | Status: AC
  Administered 2021-05-10: 2 g via INTRAVENOUS
  Filled 2021-05-09: qty 100

## 2021-05-09 MED ORDER — TRANEXAMIC ACID (OHS) BOLUS VIA INFUSION
15.0000 mg/kg | INTRAVENOUS | Status: AC
  Administered 2021-05-10: 1095 mg via INTRAVENOUS
  Filled 2021-05-09: qty 1095

## 2021-05-09 MED ORDER — EPINEPHRINE HCL 5 MG/250ML IV SOLN IN NS
0.0000 ug/min | INTRAVENOUS | Status: DC
  Filled 2021-05-09: qty 250

## 2021-05-09 MED ORDER — MILRINONE LACTATE IN DEXTROSE 20-5 MG/100ML-% IV SOLN
0.3000 ug/kg/min | INTRAVENOUS | Status: DC
  Filled 2021-05-09: qty 100

## 2021-05-09 MED ORDER — DEXMEDETOMIDINE HCL IN NACL 400 MCG/100ML IV SOLN
0.1000 ug/kg/h | INTRAVENOUS | Status: AC
  Administered 2021-05-10: .5 ug/kg/h via INTRAVENOUS
  Filled 2021-05-09: qty 100

## 2021-05-09 MED ORDER — TRANEXAMIC ACID 1000 MG/10ML IV SOLN
1.5000 mg/kg/h | INTRAVENOUS | Status: AC
  Administered 2021-05-10: 1.5 mg/kg/h via INTRAVENOUS
  Filled 2021-05-09: qty 25

## 2021-05-09 MED ORDER — INSULIN REGULAR(HUMAN) IN NACL 100-0.9 UT/100ML-% IV SOLN
INTRAVENOUS | Status: AC
  Administered 2021-05-10: 1.6 [IU]/h via INTRAVENOUS
  Filled 2021-05-09: qty 100

## 2021-05-10 ENCOUNTER — Inpatient Hospital Stay (HOSPITAL_COMMUNITY)
Admission: RE | Admit: 2021-05-10 | Discharge: 2021-05-22 | DRG: 229 | Disposition: A | Discharge status: Home / Self Care | Payer: Federal, State, Local not specified - PPO | Attending: Thoracic Surgery (Cardiothoracic Vascular Surgery) | Admitting: Thoracic Surgery (Cardiothoracic Vascular Surgery)

## 2021-05-10 ENCOUNTER — Inpatient Hospital Stay (HOSPITAL_COMMUNITY): Payer: Federal, State, Local not specified - PPO

## 2021-05-10 ENCOUNTER — Encounter (HOSPITAL_COMMUNITY)
Admission: RE | Disposition: A | Discharge status: Home / Self Care | Payer: Self-pay | Source: Home / Self Care | Attending: Thoracic Surgery (Cardiothoracic Vascular Surgery)

## 2021-05-10 ENCOUNTER — Other Ambulatory Visit: Payer: Self-pay

## 2021-05-10 ENCOUNTER — Inpatient Hospital Stay (HOSPITAL_COMMUNITY): Payer: Federal, State, Local not specified - PPO | Admitting: Anesthesiology

## 2021-05-10 ENCOUNTER — Encounter (HOSPITAL_COMMUNITY): Payer: Self-pay | Admitting: Thoracic Surgery (Cardiothoracic Vascular Surgery)

## 2021-05-10 ENCOUNTER — Inpatient Hospital Stay (HOSPITAL_COMMUNITY): Payer: Federal, State, Local not specified - PPO | Admitting: Vascular Surgery

## 2021-05-10 DIAGNOSIS — Z9049 Acquired absence of other specified parts of digestive tract: Secondary | ICD-10-CM

## 2021-05-10 DIAGNOSIS — Z79899 Other long term (current) drug therapy: Secondary | ICD-10-CM

## 2021-05-10 DIAGNOSIS — Y838 Other surgical procedures as the cause of abnormal reaction of the patient, or of later complication, without mention of misadventure at the time of the procedure: Secondary | ICD-10-CM | POA: Diagnosis not present

## 2021-05-10 DIAGNOSIS — D696 Thrombocytopenia, unspecified: Secondary | ICD-10-CM | POA: Diagnosis not present

## 2021-05-10 DIAGNOSIS — I9751 Accidental puncture and laceration of a circulatory system organ or structure during a circulatory system procedure: Secondary | ICD-10-CM | POA: Diagnosis not present

## 2021-05-10 DIAGNOSIS — Y92234 Operating room of hospital as the place of occurrence of the external cause: Secondary | ICD-10-CM | POA: Diagnosis not present

## 2021-05-10 DIAGNOSIS — I5189 Other ill-defined heart diseases: Secondary | ICD-10-CM

## 2021-05-10 DIAGNOSIS — J9811 Atelectasis: Secondary | ICD-10-CM | POA: Diagnosis not present

## 2021-05-10 DIAGNOSIS — D4989 Neoplasm of unspecified behavior of other specified sites: Secondary | ICD-10-CM | POA: Diagnosis not present

## 2021-05-10 DIAGNOSIS — K219 Gastro-esophageal reflux disease without esophagitis: Secondary | ICD-10-CM | POA: Diagnosis present

## 2021-05-10 DIAGNOSIS — J811 Chronic pulmonary edema: Secondary | ICD-10-CM | POA: Diagnosis not present

## 2021-05-10 DIAGNOSIS — R079 Chest pain, unspecified: Secondary | ICD-10-CM | POA: Diagnosis not present

## 2021-05-10 DIAGNOSIS — R531 Weakness: Secondary | ICD-10-CM | POA: Diagnosis not present

## 2021-05-10 DIAGNOSIS — E669 Obesity, unspecified: Secondary | ICD-10-CM | POA: Diagnosis present

## 2021-05-10 DIAGNOSIS — E8779 Other fluid overload: Secondary | ICD-10-CM | POA: Diagnosis not present

## 2021-05-10 DIAGNOSIS — Y713 Surgical instruments, materials and cardiovascular devices (including sutures) associated with adverse incidents: Secondary | ICD-10-CM | POA: Diagnosis not present

## 2021-05-10 DIAGNOSIS — K649 Unspecified hemorrhoids: Secondary | ICD-10-CM | POA: Diagnosis present

## 2021-05-10 DIAGNOSIS — I517 Cardiomegaly: Secondary | ICD-10-CM | POA: Diagnosis not present

## 2021-05-10 DIAGNOSIS — I480 Paroxysmal atrial fibrillation: Secondary | ICD-10-CM | POA: Diagnosis not present

## 2021-05-10 DIAGNOSIS — Z20822 Contact with and (suspected) exposure to covid-19: Secondary | ICD-10-CM | POA: Diagnosis not present

## 2021-05-10 DIAGNOSIS — Z09 Encounter for follow-up examination after completed treatment for conditions other than malignant neoplasm: Secondary | ICD-10-CM

## 2021-05-10 DIAGNOSIS — I2510 Atherosclerotic heart disease of native coronary artery without angina pectoris: Secondary | ICD-10-CM | POA: Diagnosis present

## 2021-05-10 DIAGNOSIS — Z6837 Body mass index (BMI) 37.0-37.9, adult: Secondary | ICD-10-CM

## 2021-05-10 DIAGNOSIS — Z7982 Long term (current) use of aspirin: Secondary | ICD-10-CM | POA: Diagnosis not present

## 2021-05-10 DIAGNOSIS — R739 Hyperglycemia, unspecified: Secondary | ICD-10-CM | POA: Diagnosis present

## 2021-05-10 DIAGNOSIS — J939 Pneumothorax, unspecified: Secondary | ICD-10-CM

## 2021-05-10 DIAGNOSIS — D62 Acute posthemorrhagic anemia: Secondary | ICD-10-CM | POA: Diagnosis not present

## 2021-05-10 DIAGNOSIS — D151 Benign neoplasm of heart: Principal | ICD-10-CM | POA: Diagnosis present

## 2021-05-10 DIAGNOSIS — T801XXA Vascular complications following infusion, transfusion and therapeutic injection, initial encounter: Secondary | ICD-10-CM | POA: Diagnosis not present

## 2021-05-10 DIAGNOSIS — J90 Pleural effusion, not elsewhere classified: Secondary | ICD-10-CM | POA: Diagnosis not present

## 2021-05-10 DIAGNOSIS — I351 Nonrheumatic aortic (valve) insufficiency: Secondary | ICD-10-CM | POA: Diagnosis not present

## 2021-05-10 DIAGNOSIS — E8770 Fluid overload, unspecified: Secondary | ICD-10-CM | POA: Diagnosis not present

## 2021-05-10 DIAGNOSIS — Z8546 Personal history of malignant neoplasm of prostate: Secondary | ICD-10-CM | POA: Diagnosis not present

## 2021-05-10 DIAGNOSIS — I472 Ventricular tachycardia, unspecified: Secondary | ICD-10-CM | POA: Diagnosis not present

## 2021-05-10 DIAGNOSIS — F4310 Post-traumatic stress disorder, unspecified: Secondary | ICD-10-CM | POA: Diagnosis present

## 2021-05-10 DIAGNOSIS — I4819 Other persistent atrial fibrillation: Secondary | ICD-10-CM | POA: Diagnosis present

## 2021-05-10 DIAGNOSIS — I808 Phlebitis and thrombophlebitis of other sites: Secondary | ICD-10-CM | POA: Diagnosis not present

## 2021-05-10 HISTORY — PX: TEE WITHOUT CARDIOVERSION: SHX5443

## 2021-05-10 HISTORY — PX: MINIMALLY INVASIVE EXCISION OF ATRIAL MYXOMA: SHX5974

## 2021-05-10 HISTORY — PX: MEDIASTERNOTOMY: SHX5084

## 2021-05-10 HISTORY — PX: MINIMALLY INVASIVE MAZE PROCEDURE: SHX6244

## 2021-05-10 LAB — POCT I-STAT 7, (LYTES, BLD GAS, ICA,H+H)
Acid-Base Excess: 1 mmol/L (ref 0.0–2.0)
Acid-Base Excess: 0 mmol/L (ref 0.0–2.0)
Acid-Base Excess: 0 mmol/L (ref 0.0–2.0)
Acid-base deficit: 1 mmol/L (ref 0.0–2.0)
Acid-base deficit: 2 mmol/L (ref 0.0–2.0)
Acid-base deficit: 3 mmol/L — ABNORMAL HIGH (ref 0.0–2.0)
Acid-base deficit: 4 mmol/L — ABNORMAL HIGH (ref 0.0–2.0)
Bicarbonate: 22 mmol/L (ref 20.0–28.0)
Bicarbonate: 22.7 mmol/L (ref 20.0–28.0)
Bicarbonate: 23.2 mmol/L (ref 20.0–28.0)
Bicarbonate: 23.5 mmol/L (ref 20.0–28.0)
Bicarbonate: 25.7 mmol/L (ref 20.0–28.0)
Bicarbonate: 26.7 mmol/L (ref 20.0–28.0)
Bicarbonate: 27.1 mmol/L (ref 20.0–28.0)
Calcium, Ion: 0.98 mmol/L — ABNORMAL LOW (ref 1.15–1.40)
Calcium, Ion: 1.02 mmol/L — ABNORMAL LOW (ref 1.15–1.40)
Calcium, Ion: 1.04 mmol/L — ABNORMAL LOW (ref 1.15–1.40)
Calcium, Ion: 1.21 mmol/L (ref 1.15–1.40)
Calcium, Ion: 1.22 mmol/L (ref 1.15–1.40)
Calcium, Ion: 1.22 mmol/L (ref 1.15–1.40)
Calcium, Ion: 1.28 mmol/L (ref 1.15–1.40)
HCT: 24 % — ABNORMAL LOW (ref 39.0–52.0)
HCT: 27 % — ABNORMAL LOW (ref 39.0–52.0)
HCT: 28 % — ABNORMAL LOW (ref 39.0–52.0)
HCT: 29 % — ABNORMAL LOW (ref 39.0–52.0)
HCT: 31 % — ABNORMAL LOW (ref 39.0–52.0)
HCT: 32 % — ABNORMAL LOW (ref 39.0–52.0)
HCT: 44 % (ref 39.0–52.0)
Hemoglobin: 10.5 g/dL — ABNORMAL LOW (ref 13.0–17.0)
Hemoglobin: 10.9 g/dL — ABNORMAL LOW (ref 13.0–17.0)
Hemoglobin: 15 g/dL (ref 13.0–17.0)
Hemoglobin: 8.2 g/dL — ABNORMAL LOW (ref 13.0–17.0)
Hemoglobin: 9.2 g/dL — ABNORMAL LOW (ref 13.0–17.0)
Hemoglobin: 9.5 g/dL — ABNORMAL LOW (ref 13.0–17.0)
Hemoglobin: 9.9 g/dL — ABNORMAL LOW (ref 13.0–17.0)
O2 Saturation: 100 %
O2 Saturation: 100 %
O2 Saturation: 100 %
O2 Saturation: 97 %
O2 Saturation: 98 %
O2 Saturation: 98 %
O2 Saturation: 99 %
Patient temperature: 36
Patient temperature: 37.4
Patient temperature: 37.4
Potassium: 3.8 mmol/L (ref 3.5–5.1)
Potassium: 4.2 mmol/L (ref 3.5–5.1)
Potassium: 4.6 mmol/L (ref 3.5–5.1)
Potassium: 4.6 mmol/L (ref 3.5–5.1)
Potassium: 4.9 mmol/L (ref 3.5–5.1)
Potassium: 5.4 mmol/L — ABNORMAL HIGH (ref 3.5–5.1)
Potassium: 5.5 mmol/L — ABNORMAL HIGH (ref 3.5–5.1)
Sodium: 136 mmol/L (ref 135–145)
Sodium: 138 mmol/L (ref 135–145)
Sodium: 139 mmol/L (ref 135–145)
Sodium: 140 mmol/L (ref 135–145)
Sodium: 140 mmol/L (ref 135–145)
Sodium: 140 mmol/L (ref 135–145)
Sodium: 141 mmol/L (ref 135–145)
TCO2: 23 mmol/L (ref 22–32)
TCO2: 24 mmol/L (ref 22–32)
TCO2: 24 mmol/L (ref 22–32)
TCO2: 25 mmol/L (ref 22–32)
TCO2: 27 mmol/L (ref 22–32)
TCO2: 28 mmol/L (ref 22–32)
TCO2: 29 mmol/L (ref 22–32)
pCO2 arterial: 38.1 mmHg (ref 32.0–48.0)
pCO2 arterial: 41.2 mmHg (ref 32.0–48.0)
pCO2 arterial: 44.2 mmHg (ref 32.0–48.0)
pCO2 arterial: 45.3 mmHg (ref 32.0–48.0)
pCO2 arterial: 45.8 mmHg (ref 32.0–48.0)
pCO2 arterial: 48.6 mmHg — ABNORMAL HIGH (ref 32.0–48.0)
pCO2 arterial: 55.3 mmHg — ABNORMAL HIGH (ref 32.0–48.0)
pH, Arterial: 7.293 — ABNORMAL LOW (ref 7.350–7.450)
pH, Arterial: 7.299 — ABNORMAL LOW (ref 7.350–7.450)
pH, Arterial: 7.325 — ABNORMAL LOW (ref 7.350–7.450)
pH, Arterial: 7.337 — ABNORMAL LOW (ref 7.350–7.450)
pH, Arterial: 7.355 (ref 7.350–7.450)
pH, Arterial: 7.373 (ref 7.350–7.450)
pH, Arterial: 7.392 (ref 7.350–7.450)
pO2, Arterial: 116 mmHg — ABNORMAL HIGH (ref 83.0–108.0)
pO2, Arterial: 117 mmHg — ABNORMAL HIGH (ref 83.0–108.0)
pO2, Arterial: 118 mmHg — ABNORMAL HIGH (ref 83.0–108.0)
pO2, Arterial: 274 mmHg — ABNORMAL HIGH (ref 83.0–108.0)
pO2, Arterial: 292 mmHg — ABNORMAL HIGH (ref 83.0–108.0)
pO2, Arterial: 437 mmHg — ABNORMAL HIGH (ref 83.0–108.0)
pO2, Arterial: 97 mmHg (ref 83.0–108.0)

## 2021-05-10 LAB — POCT I-STAT, CHEM 8
BUN: 13 mg/dL (ref 6–20)
BUN: 12 mg/dL (ref 6–20)
BUN: 12 mg/dL (ref 6–20)
BUN: 14 mg/dL (ref 6–20)
Calcium, Ion: 1.29 mmol/L (ref 1.15–1.40)
Calcium, Ion: 1.22 mmol/L (ref 1.15–1.40)
Calcium, Ion: 0.99 mmol/L — ABNORMAL LOW (ref 1.15–1.40)
Calcium, Ion: 0.98 mmol/L — ABNORMAL LOW (ref 1.15–1.40)
Chloride: 105 mmol/L (ref 98–111)
Chloride: 105 mmol/L (ref 98–111)
Chloride: 101 mmol/L (ref 98–111)
Chloride: 103 mmol/L (ref 98–111)
Creatinine, Ser: 0.9 mg/dL (ref 0.61–1.24)
Creatinine, Ser: 0.9 mg/dL (ref 0.61–1.24)
Creatinine, Ser: 0.9 mg/dL (ref 0.61–1.24)
Creatinine, Ser: 0.9 mg/dL (ref 0.61–1.24)
Glucose, Bld: 148 mg/dL — ABNORMAL HIGH (ref 70–99)
Glucose, Bld: 142 mg/dL — ABNORMAL HIGH (ref 70–99)
Glucose, Bld: 140 mg/dL — ABNORMAL HIGH (ref 70–99)
Glucose, Bld: 159 mg/dL — ABNORMAL HIGH (ref 70–99)
HCT: 47 % (ref 39.0–52.0)
HCT: 37 % — ABNORMAL LOW (ref 39.0–52.0)
HCT: 27 % — ABNORMAL LOW (ref 39.0–52.0)
HCT: 26 % — ABNORMAL LOW (ref 39.0–52.0)
Hemoglobin: 16 g/dL (ref 13.0–17.0)
Hemoglobin: 12.6 g/dL — ABNORMAL LOW (ref 13.0–17.0)
Hemoglobin: 9.2 g/dL — ABNORMAL LOW (ref 13.0–17.0)
Hemoglobin: 8.8 g/dL — ABNORMAL LOW (ref 13.0–17.0)
Potassium: 3.8 mmol/L (ref 3.5–5.1)
Potassium: 4.3 mmol/L (ref 3.5–5.1)
Potassium: 4.9 mmol/L (ref 3.5–5.1)
Potassium: 5.4 mmol/L — ABNORMAL HIGH (ref 3.5–5.1)
Sodium: 140 mmol/L (ref 135–145)
Sodium: 139 mmol/L (ref 135–145)
Sodium: 136 mmol/L (ref 135–145)
Sodium: 137 mmol/L (ref 135–145)
TCO2: 22 mmol/L (ref 22–32)
TCO2: 24 mmol/L (ref 22–32)
TCO2: 28 mmol/L (ref 22–32)
TCO2: 25 mmol/L (ref 22–32)

## 2021-05-10 LAB — MAGNESIUM: Magnesium: 3.5 mg/dL — ABNORMAL HIGH (ref 1.7–2.4)

## 2021-05-10 LAB — CBC
HCT: 33.4 % — ABNORMAL LOW (ref 39.0–52.0)
HCT: 32.8 % — ABNORMAL LOW (ref 39.0–52.0)
Hemoglobin: 11.1 g/dL — ABNORMAL LOW (ref 13.0–17.0)
Hemoglobin: 11.3 g/dL — ABNORMAL LOW (ref 13.0–17.0)
MCH: 29.1 pg (ref 26.0–34.0)
MCH: 29.6 pg (ref 26.0–34.0)
MCHC: 33.2 g/dL (ref 30.0–36.0)
MCHC: 34.5 g/dL (ref 30.0–36.0)
MCV: 87.7 fL (ref 80.0–100.0)
MCV: 85.9 fL (ref 80.0–100.0)
Platelets: 111 10*3/uL — ABNORMAL LOW (ref 150–400)
Platelets: 133 10*3/uL — ABNORMAL LOW (ref 150–400)
RBC: 3.81 MIL/uL — ABNORMAL LOW (ref 4.22–5.81)
RBC: 3.82 MIL/uL — ABNORMAL LOW (ref 4.22–5.81)
RDW: 13.3 % (ref 11.5–15.5)
RDW: 13.4 % (ref 11.5–15.5)
WBC: 17.9 10*3/uL — ABNORMAL HIGH (ref 4.0–10.5)
WBC: 16.9 10*3/uL — ABNORMAL HIGH (ref 4.0–10.5)
nRBC: 0 % (ref 0.0–0.2)
nRBC: 0 % (ref 0.0–0.2)

## 2021-05-10 LAB — POCT I-STAT EG7
Acid-base deficit: 1 mmol/L (ref 0.0–2.0)
Bicarbonate: 25.7 mmol/L (ref 20.0–28.0)
Calcium, Ion: 1.04 mmol/L — ABNORMAL LOW (ref 1.15–1.40)
HCT: 28 % — ABNORMAL LOW (ref 39.0–52.0)
Hemoglobin: 9.5 g/dL — ABNORMAL LOW (ref 13.0–17.0)
O2 Saturation: 84 %
Potassium: 5 mmol/L (ref 3.5–5.1)
Sodium: 137 mmol/L (ref 135–145)
TCO2: 27 mmol/L (ref 22–32)
pCO2, Ven: 52 mmHg (ref 44.0–60.0)
pH, Ven: 7.302 (ref 7.250–7.430)
pO2, Ven: 55 mmHg — ABNORMAL HIGH (ref 32.0–45.0)

## 2021-05-10 LAB — BASIC METABOLIC PANEL
Anion gap: 8 (ref 5–15)
BUN: 13 mg/dL (ref 6–20)
CO2: 19 mmol/L — ABNORMAL LOW (ref 22–32)
Calcium: 7.9 mg/dL — ABNORMAL LOW (ref 8.9–10.3)
Chloride: 107 mmol/L (ref 98–111)
Creatinine, Ser: 1.26 mg/dL — ABNORMAL HIGH (ref 0.61–1.24)
GFR, Estimated: >60 mL/min (ref >60)
Glucose, Bld: 136 mg/dL — ABNORMAL HIGH (ref 70–99)
Potassium: 4.6 mmol/L (ref 3.5–5.1)
Sodium: 134 mmol/L — ABNORMAL LOW (ref 135–145)

## 2021-05-10 LAB — GLUCOSE, CAPILLARY
Glucose-Capillary: 176 mg/dL — ABNORMAL HIGH (ref 70–99)
Glucose-Capillary: 200 mg/dL — ABNORMAL HIGH (ref 70–99)
Glucose-Capillary: 173 mg/dL — ABNORMAL HIGH (ref 70–99)
Glucose-Capillary: 162 mg/dL — ABNORMAL HIGH (ref 70–99)
Glucose-Capillary: 148 mg/dL — ABNORMAL HIGH (ref 70–99)
Glucose-Capillary: 134 mg/dL — ABNORMAL HIGH (ref 70–99)
Glucose-Capillary: 164 mg/dL — ABNORMAL HIGH (ref 70–99)
Glucose-Capillary: 149 mg/dL — ABNORMAL HIGH (ref 70–99)

## 2021-05-10 LAB — PLATELET COUNT: Platelets: 135 10*3/uL — ABNORMAL LOW (ref 150–400)

## 2021-05-10 LAB — ABO/RH: ABO/RH(D): O POS

## 2021-05-10 LAB — PREPARE RBC (CROSSMATCH)

## 2021-05-10 LAB — COOXEMETRY PANEL
Carboxyhemoglobin: 1.1 % (ref 0.5–1.5)
Methemoglobin: 1.2 % (ref 0.0–1.5)
O2 Saturation: 56.4 %
Total hemoglobin: 11.7 g/dL — ABNORMAL LOW (ref 12.0–16.0)

## 2021-05-10 LAB — APTT: aPTT: 29 seconds (ref 24–36)

## 2021-05-10 LAB — PROTIME-INR
INR: 1.4 — ABNORMAL HIGH (ref 0.8–1.2)
Prothrombin Time: 16.8 seconds — ABNORMAL HIGH (ref 11.4–15.2)

## 2021-05-10 LAB — HEMOGLOBIN AND HEMATOCRIT, BLOOD
HCT: 28.8 % — ABNORMAL LOW (ref 39.0–52.0)
Hemoglobin: 10.1 g/dL — ABNORMAL LOW (ref 13.0–17.0)

## 2021-05-10 SURGERY — EXCISION, MYXOMA, CARDIAC ATRIUM, MINIMALLY INVASIVE
Anesthesia: General | Laterality: Right

## 2021-05-10 MED ORDER — HEMOSTATIC AGENTS (NO CHARGE) OPTIME
TOPICAL | Status: DC | PRN
  Administered 2021-05-10 (×2): 1 via TOPICAL

## 2021-05-10 MED ORDER — OXYCODONE HCL 5 MG PO TABS
5.0000 mg | ORAL_TABLET | ORAL | Status: DC | PRN
  Administered 2021-05-11 – 2021-05-12 (×6): 10 mg via ORAL
  Filled 2021-05-10 (×7): qty 2

## 2021-05-10 MED ORDER — CEFAZOLIN SODIUM-DEXTROSE 2-4 GM/100ML-% IV SOLN
2.0000 g | Freq: Three times a day (TID) | INTRAVENOUS | Status: AC
  Administered 2021-05-10 – 2021-05-12 (×6): 2 g via INTRAVENOUS
  Filled 2021-05-10 (×6): qty 100

## 2021-05-10 MED ORDER — LACTATED RINGERS IV SOLN: INTRAVENOUS | Status: DC | PRN

## 2021-05-10 MED ORDER — DEXMEDETOMIDINE HCL IN NACL 400 MCG/100ML IV SOLN
0.0000 ug/kg/h | INTRAVENOUS | Status: DC
  Administered 2021-05-10: 0.5 ug/kg/h via INTRAVENOUS
  Filled 2021-05-10: qty 100

## 2021-05-10 MED ORDER — ONDANSETRON HCL 4 MG/2ML IJ SOLN
4.0000 mg | Freq: Four times a day (QID) | INTRAMUSCULAR | Status: DC | PRN
  Administered 2021-05-10 – 2021-05-13 (×5): 4 mg via INTRAVENOUS
  Filled 2021-05-10 (×5): qty 2

## 2021-05-10 MED ORDER — MIDAZOLAM HCL 5 MG/5ML IJ SOLN
INTRAMUSCULAR | Status: DC | PRN
Start: 2021-05-10 — End: 2021-05-10
  Administered 2021-05-10 (×2): 1 mg via INTRAVENOUS
  Administered 2021-05-10: 2 mg via INTRAVENOUS
  Administered 2021-05-10: 1 mg via INTRAVENOUS
  Administered 2021-05-10: 2 mg via INTRAVENOUS

## 2021-05-10 MED ORDER — SODIUM CHLORIDE 0.9% FLUSH
3.0000 mL | Freq: Two times a day (BID) | INTRAVENOUS | Status: DC
  Administered 2021-05-11: 3 mL via INTRAVENOUS

## 2021-05-10 MED ORDER — BUPIVACAINE LIPOSOME 1.3 % IJ SUSP
INTRAMUSCULAR | Status: AC
  Filled 2021-05-10: qty 20

## 2021-05-10 MED ORDER — BUPIVACAINE HCL (PF) 0.5 % IJ SOLN
INTRAMUSCULAR | Status: AC
  Filled 2021-05-10: qty 30

## 2021-05-10 MED ORDER — PROTAMINE SULFATE 10 MG/ML IV SOLN
INTRAVENOUS | Status: AC
  Filled 2021-05-10: qty 15

## 2021-05-10 MED ORDER — METOPROLOL TARTRATE 12.5 MG HALF TABLET: 12.5000 mg | ORAL_TABLET | Freq: Once | ORAL | Status: DC

## 2021-05-10 MED ORDER — SODIUM CHLORIDE 0.9% FLUSH
10.0000 mL | Freq: Two times a day (BID) | INTRAVENOUS | Status: DC
  Administered 2021-05-10 – 2021-05-11 (×2): 10 mL

## 2021-05-10 MED ORDER — HEPARIN SODIUM (PORCINE) 1000 UNIT/ML IJ SOLN
INTRAMUSCULAR | Status: DC | PRN
  Administered 2021-05-10: 40000 [IU] via INTRAVENOUS
  Administered 2021-05-10: 2000 [IU] via INTRAVENOUS

## 2021-05-10 MED ORDER — ASPIRIN 81 MG PO CHEW: 324.0000 mg | CHEWABLE_TABLET | Freq: Every day | ORAL | Status: DC

## 2021-05-10 MED ORDER — DEXAMETHASONE SODIUM PHOSPHATE 10 MG/ML IJ SOLN
INTRAMUSCULAR | Status: AC
  Filled 2021-05-10: qty 1

## 2021-05-10 MED ORDER — MIDAZOLAM HCL (PF) 10 MG/2ML IJ SOLN
INTRAMUSCULAR | Status: AC
  Filled 2021-05-10: qty 2

## 2021-05-10 MED ORDER — LACTATED RINGERS IV SOLN: INTRAVENOUS | Status: DC

## 2021-05-10 MED ORDER — SODIUM CHLORIDE 0.9 % IV SOLN: 250.0000 mL | INTRAVENOUS | Status: DC

## 2021-05-10 MED ORDER — HEPARIN SODIUM (PORCINE) 1000 UNIT/ML IJ SOLN
INTRAMUSCULAR | Status: AC
  Filled 2021-05-10: qty 10

## 2021-05-10 MED ORDER — ASPIRIN EC 325 MG PO TBEC
325.0000 mg | DELAYED_RELEASE_TABLET | Freq: Every day | ORAL | Status: DC
  Administered 2021-05-11: 325 mg via ORAL
  Filled 2021-05-10: qty 1

## 2021-05-10 MED ORDER — METOPROLOL TARTRATE 12.5 MG HALF TABLET
12.5000 mg | ORAL_TABLET | Freq: Two times a day (BID) | ORAL | Status: DC
  Administered 2021-05-12 – 2021-05-13 (×3): 12.5 mg via ORAL
  Filled 2021-05-10 (×4): qty 1

## 2021-05-10 MED ORDER — MAGNESIUM SULFATE 4 GM/100ML IV SOLN
4.0000 g | Freq: Once | INTRAVENOUS | Status: AC
  Administered 2021-05-10: 4 g via INTRAVENOUS
  Filled 2021-05-10: qty 100

## 2021-05-10 MED ORDER — FAMOTIDINE IN NACL 20-0.9 MG/50ML-% IV SOLN
20.0000 mg | Freq: Two times a day (BID) | INTRAVENOUS | Status: AC
  Administered 2021-05-10: 20 mg via INTRAVENOUS
  Filled 2021-05-10: qty 50

## 2021-05-10 MED ORDER — PLASMA-LYTE A IV SOLN
INTRAVENOUS | Status: DC | PRN
  Administered 2021-05-10: 1000 mL via INTRAVASCULAR

## 2021-05-10 MED ORDER — TRAMADOL HCL 50 MG PO TABS
50.0000 mg | ORAL_TABLET | ORAL | Status: DC | PRN
  Administered 2021-05-11: 20:00:00 50 mg via ORAL
  Administered 2021-05-12: 100 mg via ORAL
  Administered 2021-05-12: 50 mg via ORAL
  Filled 2021-05-10: qty 1
  Filled 2021-05-10: qty 2
  Filled 2021-05-10: qty 1

## 2021-05-10 MED ORDER — SODIUM BICARBONATE 8.4 % IV SOLN
50.0000 meq | Freq: Once | INTRAVENOUS | Status: AC
  Administered 2021-05-10: 50 meq via INTRAVENOUS

## 2021-05-10 MED ORDER — GLYCOPYRROLATE PF 0.2 MG/ML IJ SOSY
PREFILLED_SYRINGE | INTRAMUSCULAR | Status: DC | PRN
Start: 2021-05-10 — End: 2021-05-10
  Administered 2021-05-10: .2 mg via INTRAVENOUS

## 2021-05-10 MED ORDER — PANTOPRAZOLE SODIUM 40 MG PO TBEC
40.0000 mg | DELAYED_RELEASE_TABLET | Freq: Every day | ORAL | Status: DC
  Administered 2021-05-12 – 2021-05-22 (×11): 40 mg via ORAL
  Filled 2021-05-10 (×11): qty 1

## 2021-05-10 MED ORDER — ACETAMINOPHEN 160 MG/5ML PO SOLN
650.0000 mg | Freq: Once | ORAL | Status: AC
Start: 2021-05-10 — End: 2021-05-10

## 2021-05-10 MED ORDER — CALCIUM CHLORIDE 10 % IV SOLN
INTRAVENOUS | Status: AC
  Filled 2021-05-10: qty 10

## 2021-05-10 MED ORDER — ONDANSETRON HCL 4 MG/2ML IJ SOLN
INTRAMUSCULAR | Status: DC | PRN
  Administered 2021-05-10: 4 mg via INTRAVENOUS

## 2021-05-10 MED ORDER — MORPHINE SULFATE (PF) 2 MG/ML IV SOLN
1.0000 mg | INTRAVENOUS | Status: DC | PRN
  Administered 2021-05-10 (×2): 2 mg via INTRAVENOUS
  Filled 2021-05-10 (×2): qty 1

## 2021-05-10 MED ORDER — VANCOMYCIN HCL IN DEXTROSE 1-5 GM/200ML-% IV SOLN
1000.0000 mg | Freq: Once | INTRAVENOUS | Status: AC
Start: 2021-05-10 — End: 2021-05-10
  Administered 2021-05-10: 1000 mg via INTRAVENOUS
  Filled 2021-05-10: qty 200

## 2021-05-10 MED ORDER — METOPROLOL TARTRATE 25 MG/10 ML ORAL SUSPENSION: 12.5000 mg | Freq: Two times a day (BID) | ORAL | Status: DC

## 2021-05-10 MED ORDER — ALBUMIN HUMAN 5 % IV SOLN
INTRAVENOUS | Status: DC | PRN
Start: 2021-05-10 — End: 2021-05-10

## 2021-05-10 MED ORDER — ROCURONIUM BROMIDE 10 MG/ML (PF) SYRINGE
PREFILLED_SYRINGE | INTRAVENOUS | Status: DC | PRN
Start: 2021-05-10 — End: 2021-05-10
  Administered 2021-05-10: 100 mg via INTRAVENOUS
  Administered 2021-05-10: 30 mg via INTRAVENOUS
  Administered 2021-05-10 (×2): 50 mg via INTRAVENOUS

## 2021-05-10 MED ORDER — ACETAMINOPHEN 650 MG RE SUPP
650.0000 mg | Freq: Once | RECTAL | Status: AC
Start: 2021-05-10 — End: 2021-05-10
  Administered 2021-05-10: 650 mg via RECTAL

## 2021-05-10 MED ORDER — 0.9 % SODIUM CHLORIDE (POUR BTL) OPTIME
TOPICAL | Status: DC | PRN
  Administered 2021-05-10 (×2): 1000 mL
  Administered 2021-05-10: 3000 mL

## 2021-05-10 MED ORDER — INSULIN REGULAR(HUMAN) IN NACL 100-0.9 UT/100ML-% IV SOLN: INTRAVENOUS | Status: DC

## 2021-05-10 MED ORDER — CHLORHEXIDINE GLUCONATE 0.12 % MT SOLN
15.0000 mL | Freq: Once | OROMUCOSAL | Status: AC
  Administered 2021-05-10: 15 mL via OROMUCOSAL
  Filled 2021-05-10: qty 15

## 2021-05-10 MED ORDER — SODIUM CHLORIDE 0.9% FLUSH: 10.0000 mL | INTRAVENOUS | Status: DC | PRN

## 2021-05-10 MED ORDER — ACETAMINOPHEN 500 MG PO TABS
1000.0000 mg | ORAL_TABLET | Freq: Once | ORAL | Status: AC
  Administered 2021-05-10: 1000 mg via ORAL
  Filled 2021-05-10: qty 2

## 2021-05-10 MED ORDER — DEXAMETHASONE SODIUM PHOSPHATE 10 MG/ML IJ SOLN
INTRAMUSCULAR | Status: DC | PRN
  Administered 2021-05-10: 10 mg via INTRAVENOUS

## 2021-05-10 MED ORDER — DOCUSATE SODIUM 100 MG PO CAPS
200.0000 mg | ORAL_CAPSULE | Freq: Every day | ORAL | Status: DC
  Administered 2021-05-11: 200 mg via ORAL
  Filled 2021-05-10: qty 2

## 2021-05-10 MED ORDER — ACETAMINOPHEN 500 MG PO TABS
1000.0000 mg | ORAL_TABLET | Freq: Four times a day (QID) | ORAL | Status: AC
  Administered 2021-05-11 – 2021-05-15 (×18): 1000 mg via ORAL
  Administered 2021-05-15: 500 mg via ORAL
  Administered 2021-05-15: 1000 mg via ORAL
  Filled 2021-05-10 (×20): qty 2

## 2021-05-10 MED ORDER — PHENYLEPHRINE 40 MCG/ML (10ML) SYRINGE FOR IV PUSH (FOR BLOOD PRESSURE SUPPORT)
PREFILLED_SYRINGE | INTRAVENOUS | Status: DC | PRN
Start: 2021-05-10 — End: 2021-05-10
  Administered 2021-05-10 (×2): 40 ug via INTRAVENOUS
  Administered 2021-05-10: 160 ug via INTRAVENOUS
  Administered 2021-05-10: 40 ug via INTRAVENOUS
  Administered 2021-05-10: 80 ug via INTRAVENOUS

## 2021-05-10 MED ORDER — ACETAMINOPHEN 160 MG/5ML PO SOLN: 1000.0000 mg | Freq: Four times a day (QID) | ORAL | Status: AC

## 2021-05-10 MED ORDER — GLYCOPYRROLATE PF 0.2 MG/ML IJ SOSY
PREFILLED_SYRINGE | INTRAMUSCULAR | Status: AC
  Filled 2021-05-10: qty 1

## 2021-05-10 MED ORDER — PHENYLEPHRINE HCL-NACL 20-0.9 MG/250ML-% IV SOLN
0.0000 ug/min | INTRAVENOUS | Status: DC
  Administered 2021-05-10: 50 ug/min via INTRAVENOUS
  Administered 2021-05-11: 35 ug/min via INTRAVENOUS
  Administered 2021-05-11: 25 ug/min via INTRAVENOUS
  Filled 2021-05-10 (×3): qty 250

## 2021-05-10 MED ORDER — HEPARIN SODIUM (PORCINE) 1000 UNIT/ML IJ SOLN
INTRAMUSCULAR | Status: AC
  Filled 2021-05-10: qty 1

## 2021-05-10 MED ORDER — DEXTROSE 50 % IV SOLN: 0.0000 mL | INTRAVENOUS | Status: DC | PRN

## 2021-05-10 MED ORDER — SODIUM CHLORIDE (PF) 0.9 % IJ SOLN
OROMUCOSAL | Status: DC | PRN
  Administered 2021-05-10 (×3): 4 mL via TOPICAL

## 2021-05-10 MED ORDER — PROPOFOL 10 MG/ML IV BOLUS
INTRAVENOUS | Status: DC | PRN
  Administered 2021-05-10: 200 mg via INTRAVENOUS

## 2021-05-10 MED ORDER — LACTATED RINGERS IV SOLN
INTRAVENOUS | Status: DC | PRN
Start: 2021-05-10 — End: 2021-05-10

## 2021-05-10 MED ORDER — POTASSIUM CHLORIDE 10 MEQ/50ML IV SOLN: 10.0000 meq | INTRAVENOUS | Status: DC

## 2021-05-10 MED ORDER — PROTAMINE SULFATE 10 MG/ML IV SOLN
INTRAVENOUS | Status: AC
  Filled 2021-05-10: qty 25

## 2021-05-10 MED ORDER — MOMETASONE FURO-FORMOTEROL FUM 100-5 MCG/ACT IN AERO
2.0000 | INHALATION_SPRAY | Freq: Two times a day (BID) | RESPIRATORY_TRACT | Status: DC | PRN
  Filled 2021-05-10: qty 8.8

## 2021-05-10 MED ORDER — CHLORHEXIDINE GLUCONATE 0.12 % MT SOLN
15.0000 mL | OROMUCOSAL | Status: AC
  Administered 2021-05-10: 15 mL via OROMUCOSAL

## 2021-05-10 MED ORDER — SODIUM CHLORIDE 0.9 % IV SOLN: 10.0000 mL/h | Freq: Once | INTRAVENOUS | Status: DC

## 2021-05-10 MED ORDER — ONDANSETRON HCL 4 MG/2ML IJ SOLN
INTRAMUSCULAR | Status: AC
  Filled 2021-05-10: qty 2

## 2021-05-10 MED ORDER — BISACODYL 5 MG PO TBEC
10.0000 mg | DELAYED_RELEASE_TABLET | Freq: Every day | ORAL | Status: DC
  Administered 2021-05-11 – 2021-05-22 (×9): 10 mg via ORAL
  Filled 2021-05-10 (×10): qty 2

## 2021-05-10 MED ORDER — LIDOCAINE 2% (20 MG/ML) 5 ML SYRINGE
INTRAMUSCULAR | Status: DC | PRN
Start: 2021-05-10 — End: 2021-05-10
  Administered 2021-05-10: 100 mg via INTRAVENOUS

## 2021-05-10 MED ORDER — ~~LOC~~ CARDIAC SURGERY, PATIENT & FAMILY EDUCATION
Freq: Once | Status: DC
  Filled 2021-05-10: qty 1

## 2021-05-10 MED ORDER — NITROGLYCERIN IN D5W 200-5 MCG/ML-% IV SOLN: 0.0000 ug/min | INTRAVENOUS | Status: DC

## 2021-05-10 MED ORDER — LACTATED RINGERS IV SOLN: 500.0000 mL | Freq: Once | INTRAVENOUS | Status: DC | PRN

## 2021-05-10 MED ORDER — FENTANYL CITRATE (PF) 250 MCG/5ML IJ SOLN
INTRAMUSCULAR | Status: AC
  Filled 2021-05-10: qty 5

## 2021-05-10 MED ORDER — SODIUM CHLORIDE 0.9 % IV SOLN: INTRAVENOUS | Status: DC

## 2021-05-10 MED ORDER — ROCURONIUM BROMIDE 10 MG/ML (PF) SYRINGE
PREFILLED_SYRINGE | INTRAVENOUS | Status: AC
  Filled 2021-05-10: qty 20

## 2021-05-10 MED ORDER — PROPOFOL 10 MG/ML IV BOLUS
INTRAVENOUS | Status: AC
  Filled 2021-05-10: qty 20

## 2021-05-10 MED ORDER — METOPROLOL TARTRATE 5 MG/5ML IV SOLN
2.5000 mg | INTRAVENOUS | Status: DC | PRN
  Administered 2021-05-17: 2.5 mg via INTRAVENOUS
  Administered 2021-05-17: 5 mg via INTRAVENOUS
  Filled 2021-05-10 (×2): qty 5

## 2021-05-10 MED ORDER — ALBUMIN HUMAN 5 % IV SOLN
250.0000 mL | INTRAVENOUS | Status: AC | PRN
  Administered 2021-05-10 (×3): 12.5 g via INTRAVENOUS
  Filled 2021-05-10 (×2): qty 250

## 2021-05-10 MED ORDER — MIDAZOLAM HCL 2 MG/2ML IJ SOLN: 2.0000 mg | INTRAMUSCULAR | Status: DC | PRN

## 2021-05-10 MED ORDER — BISACODYL 10 MG RE SUPP
10.0000 mg | Freq: Every day | RECTAL | Status: DC
  Administered 2021-05-13: 10 mg via RECTAL
  Filled 2021-05-10: qty 1

## 2021-05-10 MED ORDER — FENTANYL CITRATE (PF) 250 MCG/5ML IJ SOLN
INTRAMUSCULAR | Status: DC | PRN
  Administered 2021-05-10: 200 ug via INTRAVENOUS
  Administered 2021-05-10: 100 ug via INTRAVENOUS
  Administered 2021-05-10 (×2): 50 ug via INTRAVENOUS
  Administered 2021-05-10: 300 ug via INTRAVENOUS
  Administered 2021-05-10 (×2): 50 ug via INTRAVENOUS

## 2021-05-10 MED ORDER — CHLORHEXIDINE GLUCONATE CLOTH 2 % EX PADS
6.0000 | MEDICATED_PAD | Freq: Every day | CUTANEOUS | Status: DC
  Administered 2021-05-10 – 2021-05-12 (×2): 6 via TOPICAL

## 2021-05-10 MED ORDER — SODIUM CHLORIDE 0.45 % IV SOLN: INTRAVENOUS | Status: DC | PRN

## 2021-05-10 MED ORDER — PROTAMINE SULFATE 10 MG/ML IV SOLN
INTRAVENOUS | Status: DC | PRN
Start: 2021-05-10 — End: 2021-05-10
  Administered 2021-05-10: 10 mg via INTRAVENOUS
  Administered 2021-05-10: 410 mg via INTRAVENOUS

## 2021-05-10 MED ORDER — CALCIUM CHLORIDE 10 % IV SOLN
INTRAVENOUS | Status: DC | PRN
Start: 2021-05-10 — End: 2021-05-10
  Administered 2021-05-10 (×3): 200 mg via INTRAVENOUS

## 2021-05-10 MED ORDER — SODIUM CHLORIDE 0.9% FLUSH: 3.0000 mL | INTRAVENOUS | Status: DC | PRN

## 2021-05-10 MED ORDER — CHLORHEXIDINE GLUCONATE 4 % EX LIQD: 30.0000 mL | CUTANEOUS | Status: DC

## 2021-05-10 SURGICAL SUPPLY — 189 items
ADAPTER CARDIO PERF ANTE/RETRO (ADAPTER) ×5 IMPLANT
ADH SKN CLS APL DERMABOND .7 (GAUZE/BANDAGES/DRESSINGS) ×3
ADPR PRFSN 84XANTGRD RTRGD (ADAPTER) ×3
APPLIER CLIP ROT 10 11.4 M/L (STAPLE)
APR CLP MED LRG 11.4X10 (STAPLE)
BAG COUNTER SPONGE SURGICOUNT (BAG) ×3 IMPLANT
BAG DECANTER FOR FLEXI CONT (MISCELLANEOUS) ×5 IMPLANT
BAG SPNG CNTER NS LX DISP (BAG) ×9
BIT DRILL 7/64X5 DISP (BIT) IMPLANT
BLADE CLIPPER SURG (BLADE) ×13 IMPLANT
BLADE STERNUM SYSTEM 6 (BLADE) ×1 IMPLANT
BLADE SURG 11 STRL SS (BLADE) ×4 IMPLANT
BLADE SURG 15 STRL LF DISP TIS (BLADE) IMPLANT
BLADE SURG 15 STRL SS (BLADE) ×4
CANISTER SUCT 3000ML PPV (MISCELLANEOUS) ×18 IMPLANT
CANNULA AORTIC ROOT 9FR (CANNULA) ×1 IMPLANT
CANNULA EZ GLIDE 8.0 24FR (CANNULA) ×1 IMPLANT
CANNULA FEM VENOUS REMOTE 22FR (CANNULA) IMPLANT
CANNULA FEMORAL ART 14 SM (MISCELLANEOUS) ×4 IMPLANT
CANNULA GUNDRY RCSP 15FR (MISCELLANEOUS) ×5 IMPLANT
CANNULA OPTISITE PERFUSION 16F (CANNULA) IMPLANT
CANNULA OPTISITE PERFUSION 18F (CANNULA) IMPLANT
CANNULA OPTISITE PERFUSION 22F (CANNULA) IMPLANT
CANNULA SUMP PERICARDIAL (CANNULA) ×9 IMPLANT
CANNULA VRC MALB SNGL STG 28FR (MISCELLANEOUS) IMPLANT
CANNULA VRC MALB SNGL STG 36FR (MISCELLANEOUS) IMPLANT
CARDIOBLATE CARDIAC ABLATION (MISCELLANEOUS)
CATH KIT ON-Q SILVERSOAK 5 (CATHETERS) IMPLANT
CATH KIT ON-Q SILVERSOAK 5IN (CATHETERS) IMPLANT
CATH ROBINSON RED A/P 18FR (CATHETERS) ×2 IMPLANT
CATH THORACIC 28FR (CATHETERS) IMPLANT
CATH THORACIC 28FR RT ANG (CATHETERS) IMPLANT
CATH THORACIC 36FR (CATHETERS) IMPLANT
CATH THORACIC 36FR RT ANG (CATHETERS) IMPLANT
CELLS DAT CNTRL 66122 CELL SVR (MISCELLANEOUS) IMPLANT
CLIP APPLIE ROT 10 11.4 M/L (STAPLE) IMPLANT
CLIP TI WIDE RED SMALL 24 (CLIP) ×1 IMPLANT
CLIP VESOCCLUDE MED 6/CT (CLIP) ×4 IMPLANT
CNTNR URN SCR LID CUP LEK RST (MISCELLANEOUS) ×4 IMPLANT
CONN 1/2X1/2X1/2  BEN (MISCELLANEOUS) ×8
CONN 1/2X1/2X1/2 BEN (MISCELLANEOUS) IMPLANT
CONN ST 1/2X1/2  BEN (MISCELLANEOUS) ×4
CONN ST 1/2X1/2 BEN (MISCELLANEOUS) IMPLANT
CONN ST 1/4X3/8  BEN (MISCELLANEOUS)
CONN ST 1/4X3/8 BEN (MISCELLANEOUS) ×8 IMPLANT
CONN ST 3/8 X 1/2 (MISCELLANEOUS) ×1 IMPLANT
CONNECTOR 1/2X3/8X1/2 3 WAY (MISCELLANEOUS) ×8
CONNECTOR 1/2X3/8X1/2 3WAY (MISCELLANEOUS) ×4 IMPLANT
CONT SPEC 4OZ STRL OR WHT (MISCELLANEOUS) ×4
CONTAINER PROTECT SURGISLUSH (MISCELLANEOUS) ×10 IMPLANT
COVER BACK TABLE 24X17X13 BIG (DRAPES) ×5 IMPLANT
COVER PROBE W GEL 5X96 (DRAPES) ×9 IMPLANT
DERMABOND ADVANCED (GAUZE/BANDAGES/DRESSINGS) ×1
DERMABOND ADVANCED .7 DNX12 (GAUZE/BANDAGES/DRESSINGS) ×8 IMPLANT
DEVICE CARDIOBLATE CARDIAC ABL (MISCELLANEOUS) IMPLANT
DEVICE CLOSURE PERCLS PRGLD 6F (VASCULAR PRODUCTS) ×16 IMPLANT
DEVICE TROCAR PUNCTURE CLOSURE (ENDOMECHANICALS) ×5 IMPLANT
DRAIN CHANNEL 32F RND 10.7 FF (WOUND CARE) ×8 IMPLANT
DRAPE C-ARM 42X72 X-RAY (DRAPES) ×5 IMPLANT
DRAPE CV SPLIT W-CLR ANES SCRN (DRAPES) ×5 IMPLANT
DRAPE INCISE IOBAN 66X45 STRL (DRAPES) ×10 IMPLANT
DRAPE PERI GROIN 82X75IN TIB (DRAPES) ×5 IMPLANT
DRAPE WARM FLUID 44X44 (DRAPES) ×5 IMPLANT
DRSG AQUACEL AG ADV 3.5X 6 (GAUZE/BANDAGES/DRESSINGS) IMPLANT
DRSG COVADERM 4X14 (GAUZE/BANDAGES/DRESSINGS) ×1 IMPLANT
DRSG COVADERM 4X6 (GAUZE/BANDAGES/DRESSINGS) ×1 IMPLANT
DRSG COVADERM 4X8 (GAUZE/BANDAGES/DRESSINGS) ×4 IMPLANT
ELECT BLADE 6.5 EXT (BLADE) ×9 IMPLANT
ELECT REM PT RETURN 9FT ADLT (ELECTROSURGICAL) ×8
ELECTRODE REM PT RTRN 9FT ADLT (ELECTROSURGICAL) ×12 IMPLANT
FELT TEFLON 1X6 (MISCELLANEOUS) ×5 IMPLANT
FEMORAL VENOUS CANN RAP (CANNULA) IMPLANT
GAUZE 4X4 16PLY ~~LOC~~+RFID DBL (SPONGE) ×10 IMPLANT
GAUZE SPONGE 4X4 12PLY STRL (GAUZE/BANDAGES/DRESSINGS) ×8 IMPLANT
GAUZE SPONGE 4X4 12PLY STRL LF (GAUZE/BANDAGES/DRESSINGS) ×1 IMPLANT
GLOVE SURG GAMMEX LF SZ6.5 (GLOVE) ×4 IMPLANT
GLOVE SURG GAMMEX LF SZ7 (GLOVE) ×3 IMPLANT
GLOVE SURG MICRO LTX SZ6 (GLOVE) ×3 IMPLANT
GLOVE SURG MICRO LTX SZ6.5 (GLOVE) ×5 IMPLANT
GLOVE SURG ORTHO LTX SZ7.5 (GLOVE) ×12 IMPLANT
GLOVE SURG SIGNA 7.5 PF LTX (GLOVE) ×12 IMPLANT
GOWN STRL REUS W/ TWL LRG LVL3 (GOWN DISPOSABLE) ×24 IMPLANT
GOWN STRL REUS W/ TWL XL LVL3 (GOWN DISPOSABLE) ×4 IMPLANT
GOWN STRL REUS W/TWL LRG LVL3 (GOWN DISPOSABLE) ×24
GOWN STRL REUS W/TWL XL LVL3 (GOWN DISPOSABLE) ×4
GRASPER SUT TROCAR 14GX15 (MISCELLANEOUS) ×4 IMPLANT
HEMOSTAT POWDER SURGIFOAM 1G (HEMOSTASIS) ×3 IMPLANT
HEMOSTAT SURGICEL 2X14 (HEMOSTASIS) ×2 IMPLANT
INSERT CONFORM CROSS CLAMP 66M (MISCELLANEOUS) IMPLANT
INSERT CONFORM CROSS CLAMP 86M (MISCELLANEOUS) IMPLANT
INSERT FOGARTY 61MM (MISCELLANEOUS) IMPLANT
INSERT FOGARTY XLG (MISCELLANEOUS) ×1 IMPLANT
KIT BASIN OR (CUSTOM PROCEDURE TRAY) ×9 IMPLANT
KIT DILATOR VASC 18G NDL (KITS) ×6 IMPLANT
KIT SUCTION CATH 14FR (SUCTIONS) ×9 IMPLANT
KIT TURNOVER KIT B (KITS) ×9 IMPLANT
LEAD PACING MYOCARDI (MISCELLANEOUS) ×6 IMPLANT
LINE SUCTION ATS CATS PLUS (LINER) ×1 IMPLANT
LOOP VESSEL SUPERMAXI WHITE (MISCELLANEOUS) ×1 IMPLANT
NDL AORTIC ROOT 14G 7F (CATHETERS) ×4 IMPLANT
NEEDLE AORTIC ROOT 14G 7F (CATHETERS) ×4 IMPLANT
NS IRRIG 1000ML POUR BTL (IV SOLUTION) ×37 IMPLANT
PACK CHEST (CUSTOM PROCEDURE TRAY) ×4 IMPLANT
PACK E MIN INVASIVE VALVE (SUTURE) ×4 IMPLANT
PACK OPEN HEART (CUSTOM PROCEDURE TRAY) ×5 IMPLANT
PAD ARMBOARD 7.5X6 YLW CONV (MISCELLANEOUS) ×26 IMPLANT
PAD ELECT DEFIB RADIOL ZOLL (MISCELLANEOUS) ×5 IMPLANT
PASSER SUT SWANSON 36MM LOOP (INSTRUMENTS) IMPLANT
PATCH HEMASHIELD 8X75 (Vascular Products) ×1 IMPLANT
PENCIL BUTTON HOLSTER BLD 10FT (ELECTRODE) ×1 IMPLANT
PERCLOSE PROGLIDE 6F (VASCULAR PRODUCTS) ×16
POSITIONER HEAD DONUT 9IN (MISCELLANEOUS) ×5 IMPLANT
PROBE CRYO2-ABLATION MALLABLE (MISCELLANEOUS) ×1 IMPLANT
RETRACTOR WND ALEXIS 18 MED (MISCELLANEOUS) IMPLANT
RTRCTR WOUND ALEXIS 18CM MED (MISCELLANEOUS)
SEALANT SURG COSEAL 8ML (VASCULAR PRODUCTS) IMPLANT
SET CANNULATION TOURNIQUET (MISCELLANEOUS) ×6 IMPLANT
SET IRRIG TUBING LAPAROSCOPIC (IRRIGATION / IRRIGATOR) ×4 IMPLANT
SET MICROPUNCTURE 5F STIFF (MISCELLANEOUS) ×5 IMPLANT
SET MPS 3-ND DEL (MISCELLANEOUS) ×1 IMPLANT
SHEATH PINNACLE 8F 10CM (SHEATH) ×12 IMPLANT
SLEEVE ENDOPATH XCEL 5M (ENDOMECHANICALS) IMPLANT
SOL ANTI FOG 6CC (MISCELLANEOUS) ×4 IMPLANT
SOLUTION ANTI FOG 6CC (MISCELLANEOUS) ×2
SPONGE T-LAP 18X18 ~~LOC~~+RFID (SPONGE) ×38 IMPLANT
SPONGE T-LAP 4X18 ~~LOC~~+RFID (SPONGE) ×10 IMPLANT
SPONGE TONSIL TAPE 1 RFD (DISPOSABLE) ×4 IMPLANT
STAPLER VISISTAT 35W (STAPLE) IMPLANT
STOPCOCK 4 WAY LG BORE MALE ST (IV SETS) ×4 IMPLANT
STRIP PERIGUARD 6X8 (Vascular Products) ×1 IMPLANT
SUT BONE WAX W31G (SUTURE) ×5 IMPLANT
SUT ETHIBOND 2 0 SH (SUTURE) ×16
SUT ETHIBOND 2 0 SH 36X2 (SUTURE) IMPLANT
SUT ETHIBOND X763 2 0 SH 1 (SUTURE) ×4 IMPLANT
SUT GORETEX CV 4 TH 22 36 (SUTURE) ×1 IMPLANT
SUT GORETEX CV4 TH-18 (SUTURE) ×2 IMPLANT
SUT PROLENE 2 0 MH 48 (SUTURE) IMPLANT
SUT PROLENE 2 0 SH DA (SUTURE) IMPLANT
SUT PROLENE 3 0 SH 48 (SUTURE) IMPLANT
SUT PROLENE 3 0 SH DA (SUTURE) ×2 IMPLANT
SUT PROLENE 3 0 SH1 36 (SUTURE) ×18 IMPLANT
SUT PROLENE 4 0 RB 1 (SUTURE) ×52
SUT PROLENE 4 0 SH DA (SUTURE) ×5 IMPLANT
SUT PROLENE 4-0 RB1 .5 CRCL 36 (SUTURE) ×4 IMPLANT
SUT PROLENE 5 0 C 1 24 (SUTURE) ×1 IMPLANT
SUT PROLENE 5 0 C 1 36 (SUTURE) ×5 IMPLANT
SUT PROLENE 6 0 C 1 30 (SUTURE) ×7 IMPLANT
SUT SILK  1 MH (SUTURE) ×12
SUT SILK 1 MH (SUTURE) ×8 IMPLANT
SUT SILK 1 TIES 10X30 (SUTURE) ×1 IMPLANT
SUT SILK 2 0 SH CR/8 (SUTURE) ×1 IMPLANT
SUT SILK 2 0SH CR/8 30 (SUTURE) ×4 IMPLANT
SUT SILK 3 0 SH CR/8 (SUTURE) ×1 IMPLANT
SUT STEEL 6MS V (SUTURE) ×1 IMPLANT
SUT STEEL SZ 6 DBL 3X14 BALL (SUTURE) ×1 IMPLANT
SUT TEM PAC WIRE 2 0 SH (SUTURE) ×4 IMPLANT
SUT VIC AB 1 CTX 18 (SUTURE) IMPLANT
SUT VIC AB 1 CTX 36 (SUTURE) ×12
SUT VIC AB 1 CTX36XBRD ANBCTR (SUTURE) ×4 IMPLANT
SUT VIC AB 2-0 CT1 27 (SUTURE) ×4
SUT VIC AB 2-0 CT1 TAPERPNT 27 (SUTURE) IMPLANT
SUT VIC AB 2-0 CTX 36 (SUTURE) ×3 IMPLANT
SUT VIC AB 3-0 SH 27 (SUTURE) ×4
SUT VIC AB 3-0 SH 27X BRD (SUTURE) IMPLANT
SUT VIC AB 3-0 X1 27 (SUTURE) ×8 IMPLANT
SUT VICRYL 2 TP 1 (SUTURE) ×5 IMPLANT
SWAB CULTURE ESWAB REG 1ML (MISCELLANEOUS) IMPLANT
SYR 10ML LL (SYRINGE) ×5 IMPLANT
SYR 20ML LL LF (SYRINGE) IMPLANT
SYR 50ML LL SCALE MARK (SYRINGE) ×4 IMPLANT
SYSTEM SAHARA CHEST DRAIN ATS (WOUND CARE) ×9 IMPLANT
TAPE CLOTH SURG 4X10 WHT LF (GAUZE/BANDAGES/DRESSINGS) ×1 IMPLANT
TAPE PAPER 2X10 WHT MICROPORE (GAUZE/BANDAGES/DRESSINGS) ×1 IMPLANT
TOWEL GREEN STERILE (TOWEL DISPOSABLE) ×20 IMPLANT
TOWEL GREEN STERILE FF (TOWEL DISPOSABLE) ×10 IMPLANT
TRAY FOLEY SLVR 16FR LF STAT (SET/KITS/TRAYS/PACK) ×5 IMPLANT
TROCAR XCEL BLADELESS 5X75MML (TROCAR) ×5 IMPLANT
TROCAR XCEL NON-BLD 11X100MML (ENDOMECHANICALS) ×8 IMPLANT
TROCAR XCEL NON-BLD 5MMX100MML (ENDOMECHANICALS) IMPLANT
TUBE SUCT INTRACARD DLP 20F (MISCELLANEOUS) ×5 IMPLANT
TUBING EXTENTION W/L.L. (IV SETS) IMPLANT
TUNNELER SHEATH ON-Q 11GX8 DSP (PAIN MANAGEMENT) IMPLANT
UNDERPAD 30X36 HEAVY ABSORB (UNDERPADS AND DIAPERS) ×5 IMPLANT
VRC MALLEABLE SINGLE STG 28FR (MISCELLANEOUS) ×4
VRC MALLEABLE SINGLE STG 36FR (MISCELLANEOUS) ×4
WATER STERILE IRR 1000ML POUR (IV SOLUTION) ×14 IMPLANT
WIRE EMERALD 3MM-J .035X150CM (WIRE) ×5 IMPLANT
WIRE HI TORQ VERSACORE-J 145CM (WIRE) ×1 IMPLANT
YANKAUER SUCT BULB TIP NO VENT (SUCTIONS) ×2 IMPLANT

## 2021-05-10 NOTE — Brief Op Note (Addendum)
05/10/2021  1:26 PM  PATIENT:  Shawn Suarez  56 y.o. male  PRE-OPERATIVE DIAGNOSIS:  1. LEFT ATRIAL MYXOMA 2. PAROXYSMAL ATRIAL FIBRILLATION  POST-OPERATIVE DIAGNOSIS:  1. LEFT ATRIAL MYXOMA 2. PAROXYSMAL ATRIAL FIBRILLATION  PROCEDURE:  MEDIAN STERNOTOMY EXCISION OF LEFT ATRIAL MYXOMA  PERICARDIAL PATCH REPAIR of INTRA ATRIAL SEPTUM PULMONARY VEIN ISOLATION using CRYO,  REPAIR of RIGHT FEMORAL ARTERY with HEMASHIELD PATCH, and  APPLICATION OF CELL SAVER  SURGEON:  Melrose Nakayama, MD - Primary  PHYSICIAN ASSISTANT: Lars Pinks PA-C  ASSISTANTS: Despina Arias RNFA   ANESTHESIA:   general  EBL:  700 mL   DRAINS:  Chest tubes placed in the mediastinal and pleural spaces    SPECIMEN:  Source of Specimen:  Left atrial mass  DISPOSITION OF SPECIMEN:  PATHOLOGY  COUNTS CORRECT:  YES  DICTATION: .Dragon Dictation  PLAN OF CARE: Admit to inpatient   PATIENT DISPOSITION:  ICU - intubated and hemodynamically stable.   Delay start of Pharmacological VTE agent (>24hrs) due to surgical blood loss or risk of bleeding: yes  BASELINE WEIGHT: 117.9 kg

## 2021-05-10 NOTE — Hospital Course (Addendum)
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  He recently began noticing palpitations and shortness of breath.  He saw Dr. Marlou Porch.  An echocardiogram showed normal left ventricular function with ejection fraction of 60 to 65%.  There is no valvular pathology.  No mention was made of an atrial myxoma.  He then had a CT for coronary calcium scoring.  That showed no hemodynamically significant coronary lesions but did showed a 2.4 x 2.5 x 2.2 cm left atrial mass arising from the intra-atrial septum.  He then had a cardiac MRI which also showed the left atrial myxoma. Dr.Hendrickson discussed with the patient that the treatment of an atrial myxoma is resection.  Regardless of what type of tumor it is the treatment would be resection.  This would be done with a biatrial approach.  Patient strongly desires to have it done via a right minithoracotomy rather than sternotomy. Dr. Roxan Hockey also discussed doing a pulmonary vein isolation with cryoablation. Potential risks, benefits, and complications of the surgery were discussed with the patient and he agreed to proceed with surgery.  Hospital Course: Patient underwent a minimally invasive via right anterior thoracotomy excision of an atrial myxoma converted to median Sternotomy, pericardial patch repair of intra atrial septum/left atrium, pulmonary vein isolation using cryo, repair of right femoral artery. He was transported from the OR to [facility] ICU in stable condition. He was extubated late the evening of surgery. He was weaned off Neo Synephrine drip. Gordy Councilman and chest tubes were removed on post op day one. He maintained SR. He had expected post op blood loss anemia. He did not require a post op transfusion. He had thrombocytopenia but this did resolve as last platelet count was 252,000. He was weaned off the Insulin drip. Pre op HGA1C was  6.3. He likely has pre diabetes and will need further surveillance as an outpatient by his medical doctor. He did have nausea but this did resolve with Zofran and scheduled Reglan. He developed PAF. He was put on IV Amiodarone. Epicardial pacing wires were removed on 02/04. He was started on Apixaban. He has been tolerating a diet and has had a bowel movement. He is ambulating on room air. The IV in the back of his right hand infiltrated on 02/05. He had mild phlebitis and because erythema worsened, he was given Keflex. All wounds are clean, dry, and healing without signs of infection. He has Doppler DP right foot (repair of right common femoral artery).His a fib rate is starting to be better controlled with Toprol XL 75 mg daily. He was placed on Digoxin 02/09 because he was more in a fib with RVR.  This was subsequently stopped by cardiology.  BB unable to be titrated further and unable to start Cardizem secondary to labile BP.  He continued to have paroxysmal atrial fibrillation.  The amiodarone was continued.  He had significant volume excess and was diuresed aggressively on 2/10 and 2/11 with appropriate response.  His edema improved. As discussed with Dr. Roxan Hockey, will continue daily Lasix for now. He is felt surgically stable for discharge today.

## 2021-05-10 NOTE — Progress Notes (Signed)
EVENING ROUNDS NOTE :     [address]       [place] [address]             [phone]                 Day of Surgery Procedure(s) (LRB): MINIMALLY INVASIVE EXCISION OF ATRIAL MYXOMA (Right) PARTIAL CRYOMAZE PROCEDURE (N/A) TRANSESOPHAGEAL ECHOCARDIOGRAM (TEE) MEDIAN STERNOTOMY (N/A) APPLICATION OF CELL SAVER (N/A)   Total Length of Stay:  LOS: 0 days  Events:   Minimal CT output Low dose neo Warm LEs     BP (!) 112/99    Pulse 89    Temp 99.1 F (37.3 C)    Resp 15    Ht 5\' 9"  (1.753 m)    Wt 117.9 kg    SpO2 100%    BMI 38.40 kg/m   PAP: (7-25)/(4-10) 7/4  Vent Mode: PRVC;SIMV;PSV FiO2 (%):  [50 %] 50 % Set Rate:  [12 bmp-14 bmp] 14 bmp Vt Set:  [560 mL] 560 mL PEEP:  [5 cmH20] 5 cmH20 Pressure Support:  [10 cmH20] 10 cmH20   sodium chloride Stopped (05/10/21 1643)   sodium chloride     [START ON 05/11/2021] sodium chloride     albumin human 12.5 g (05/10/21 1922)    ceFAZolin (ANCEF) IV Stopped (05/10/21 1700)   dexmedetomidine (PRECEDEX) IV infusion 0.5 mcg/kg/hr (05/10/21 1801)   famotidine (PEPCID) IV Stopped (05/10/21 1633)   insulin 3.4 Units/hr (05/10/21 1845)   lactated ringers     lactated ringers 10 mL/hr at 05/10/21 1845   lactated ringers 20 mL/hr at 05/10/21 1845   magnesium sulfate 20 mL/hr at 05/10/21 1845   nitroGLYCERIN     phenylephrine (NEO-SYNEPHRINE) Adult infusion 40 mcg/min (05/10/21 1845)   vancomycin      I/O last 3 completed shifts: In: 3445.1 [I.V.:1924.8; Blood:820; IV Piggyback:700.3] Out: 3668 [DPTEL:0761; Blood:2000; Chest Tube:60]   CBC Latest Ref Rng & Units 05/10/2021 05/10/2021 05/10/2021  WBC 4.0 - 10.5 K/uL 17.9(H) - -  Hemoglobin 13.0 - 17.0 g/dL 11.1(L) 8.8(L) 8.2(L)  Hematocrit 39.0 - 52.0 % 33.4(L) 26.0(L) 24.0(L)  Platelets 150 - 400 K/uL 111(L) - -    BMP Latest Ref Rng & Units 05/10/2021 05/10/2021 05/10/2021  Glucose 70 - 99 mg/dL 159(H) - -  BUN 6 - 20 mg/dL 14 - -  Creatinine 0.61 - 1.24 mg/dL  0.90 - -  BUN/Creat Ratio 9 - 20 - - -  Sodium 135 - 145 mmol/L 137 138 136  Potassium 3.5 - 5.1 mmol/L 5.4(H) 5.4(H) 5.5(H)  Chloride 98 - 111 mmol/L 103 - -  CO2 22 - 32 mmol/L - - -  Calcium 8.9 - 10.3 mg/dL - - -    ABG    Component Value Date/Time   PHART 7.373 05/10/2021 1348   PCO2ART 44.2 05/10/2021 1348   PO2ART 274 (H) 05/10/2021 1348   HCO3 25.7 05/10/2021 1348   TCO2 25 05/10/2021 1352   ACIDBASEDEF 1.0 05/10/2021 1211   O2SAT 56.4 05/10/2021 [place], MD 05/10/2021 7:32 PM

## 2021-05-10 NOTE — Anesthesia Procedure Notes (Addendum)
Central Venous Catheter Insertion Performed by: Suzette Battiest, MD, anesthesiologist Start/End2/04/2021 7:50 AM, 05/10/2021 8:05 AM Patient location: Pre-op. Preanesthetic checklist: patient identified, IV checked, site marked, risks and benefits discussed, surgical consent, monitors and equipment checked, pre-op evaluation, timeout performed and anesthesia consent Position: Trendelenburg Lidocaine 1% used for infiltration and patient sedated Hand hygiene performed , maximum sterile barriers used  and Seldinger technique used Catheter size: 9 Fr Total catheter length 10. Central line and PA cath was placed.MAC introducer Swan type:thermodilution PA Cath depth:50 Procedure performed using ultrasound guided technique. Ultrasound Notes:anatomy identified, needle tip was noted to be adjacent to the nerve/plexus identified, no ultrasound evidence of intravascular and/or intraneural injection and image(s) printed for medical record Attempts: 2 Following insertion, line sutured, dressing applied and Biopatch. Post procedure assessment: blood return through all ports, free fluid flow and no air  Patient tolerated the procedure well with no immediate complications.

## 2021-05-10 NOTE — Interval H&P Note (Signed)
History and Physical Interval Note:  05/10/2021 7:56 AM  Shawn Suarez  has presented today for surgery, with the diagnosis of LEFT ATRIAL MYXOMA.  The various methods of treatment have been discussed with the patient and family. After consideration of risks, benefits and other options for treatment, the patient has consented to  Procedure(s): THORACOTOMY (Right) RESECTION OF ATRIAL MYXOMA (N/A) PARTIAL CRYOMAZE (N/A) TRANSESOPHAGEAL ECHOCARDIOGRAM (TEE) (N/A) as a surgical intervention.  The patient's history has been reviewed, patient examined, no change in status, stable for surgery.  I have reviewed the patient's chart and labs.  Questions were answered to the patient's satisfaction.     Melrose Nakayama

## 2021-05-10 NOTE — Procedures (Signed)
Extubation Procedure Note  Patient Details:   Name: Shawn Suarez DOB: 09/07/1965 MRN: [id number]   Airway Documentation:    Vent end date: 05/10/21 Vent end time: 2215   Evaluation  O2 sats: stable throughout Complications: No apparent complications Patient did tolerate procedure well. Bilateral Breath Sounds: Clear, Diminished   Yes  Graciella Freer 05/10/2021, 10:18 PM   Pt extubated per rapid wean protocol. Pt able to perform, NIF of -30 and VC of 1.7L. Pt had positive cuff leak and was placed on 4L [place] at this time.

## 2021-05-10 NOTE — Transfer of Care (Signed)
Immediate Anesthesia Transfer of Care Note  Patient: NICKOLAS CHALFIN  Procedure(s) Performed: MINIMALLY INVASIVE EXCISION OF ATRIAL MYXOMA (Right) PARTIAL CRYOMAZE PROCEDURE TRANSESOPHAGEAL ECHOCARDIOGRAM (TEE) APPLICATION OF CELL SAVER MEDIAN STERNOTOMY  Patient Location: SICU  Anesthesia Type:General  Level of Consciousness: sedated and Patient remains intubated per anesthesia plan  Airway & Oxygen Therapy: Patient remains intubated per anesthesia plan and Patient placed on Ventilator (see vital sign flow sheet for setting)  Post-op Assessment: Report given to RN and Post -op Vital signs reviewed and stable  Post vital signs: Reviewed and stable  Last Vitals:  Vitals Value Taken Time  BP    Temp 36.8 C 05/10/21 1526  Pulse 80 05/10/21 1526  Resp 14 05/10/21 1526  SpO2 99 % 05/10/21 1526  Vitals shown include unvalidated device data.  Last Pain:  Vitals:   05/10/21 0718  PainSc: 0-No pain      Patients Stated Pain Goal: 1 (66/59/93 5701)  Complications: No notable events documented.

## 2021-05-10 NOTE — Discharge Summary (Signed)
Physician Discharge Summary       [address]       [place] [address]             [phone]    Patient ID: Shawn Suarez MRN: [id number] DOB/AGE: 1966-04-01 56 y.o.  Admit date: 05/10/2021 Discharge date: 05/22/2021  Admission Diagnoses: Left atrial myxoma Paroxysmal atrial fibrillation [facility])  Discharge Diagnoses:  S/p Median sternotomy, excision of left atrial myxoma, bovine pericardial patch, repair of interatrial septum, pulmonary vein isolation using cryoablation, repair of right common femoral artery with Hemashield patch 2. Expected post op blood loss anemia 3. History of frequent PVCs 4. History of GERD (gastroesophageal reflux disease)  Consults: cardiology  Procedure (s):  Median sternotomy, excision of left atrial myxoma, bovine pericardial patch, repair of interatrial septum, pulmonary vein isolation using cryoablation, repair of right common femoral artery with Hemashield patch by Dr. Roxan Hockey on 05/10/2021.  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  He recently began noticing palpitations and shortness of breath.  He saw Dr. Marlou Porch.  An echocardiogram showed normal left ventricular function with ejection fraction of 60 to 65%.  There is no valvular pathology.  No mention was made of an atrial myxoma.  He then had a CT for coronary calcium scoring.  That showed no hemodynamically significant coronary lesions but did showed a 2.4 x 2.5 x 2.2 cm left atrial mass arising from the intra-atrial septum.  He then had a cardiac MRI which also showed the left atrial myxoma. Dr.Hendrickson discussed with the patient that the treatment of an atrial myxoma is resection.  Regardless of what type of tumor it is the  treatment would be resection.  This would be done with a biatrial approach.  Patient strongly desires to have it done via a right minithoracotomy rather than sternotomy. Dr. Roxan Hockey also discussed doing a pulmonary vein isolation with cryoablation. Potential risks, benefits, and complications of the surgery were discussed with the patient and he agreed to proceed with surgery.  Hospital Course: Patient underwent a minimally invasive via right anterior thoracotomy excision of an atrial myxoma converted to median Sternotomy, pericardial patch repair of intra atrial septum/left atrium, pulmonary vein isolation using cryo, repair of right femoral artery. He was transported from the OR to [facility] ICU in stable condition. He was extubated late the evening of surgery. He was weaned off Neo Synephrine drip. Gordy Councilman and chest tubes were removed on post op day one. He maintained SR. He had expected post op blood loss anemia. He did not require a post op transfusion. He had thrombocytopenia but this did resolve as last platelet count was 252,000. He was weaned off the Insulin drip. Pre op HGA1C was 6.3. He likely has pre diabetes and will need further surveillance as an outpatient by his medical doctor. He did have nausea but this did resolve with Zofran and scheduled Reglan. He developed PAF. He was put on IV Amiodarone. Epicardial pacing wires were removed on 02/04. He was started on Apixaban. He has been tolerating a diet and has had a bowel movement. He is ambulating on room air. The IV in the back of his right hand infiltrated on 02/05. He had mild phlebitis and because erythema worsened, he was given Keflex. All wounds are clean, dry,  and healing without signs of infection. He has Doppler DP right foot (repair of right common femoral artery).His a fib rate is starting to be better controlled with Toprol XL 75 mg daily. He was placed on Digoxin 02/09 because he was more in a fib with RVR.  This was subsequently  stopped by cardiology.  BB unable to be titrated further and unable to start Cardizem secondary to labile BP.  He continued to have paroxysmal atrial fibrillation.  The amiodarone was continued.  He had significant volume excess and was diuresed aggressively on 2/10 and 2/11 with appropriate response.  His edema improved. As discussed with Dr. Roxan Hockey, will continue daily Lasix for now. He is felt surgically stable for discharge today.    Latest Vital Signs: Blood pressure 107/69, pulse 72, temperature 98.3 F (36.8 C), temperature source Oral, resp. rate 18, height 5\' 9"  (1.753 m), weight 114.9 kg, SpO2 94 %.  Physical Exam: Cardiovascular: RRR Pulmonary:Clear to auscultation bilaterally Abdomen: Soft, non tender, bowel sounds present. Extremities: Mild bilateral lower extremity edema. Right hand with swelling;erythema resolved. Mild phlebitis secondary to infiltrated IV.  Wounds: Sternal, right anterior wounds are clean and dry.  No erythema or signs of infection. Chest tube wound with superficial skin dehiscence;no drainage  Discharge Condition:Stable and discharged to home.  Recent laboratory studies:  Lab Results  Component Value Date   WBC 9.5 05/20/2021   HGB 9.0 (L) 05/20/2021   HCT 29.0 (L) 05/20/2021   MCV 91.5 05/20/2021   PLT 459 (H) 05/20/2021   Lab Results  Component Value Date   NA 136 05/21/2021   K 4.3 05/21/2021   CL 101 05/21/2021   CO2 25 05/21/2021   CREATININE 1.35 (H) 05/21/2021   GLUCOSE 136 (H) 05/21/2021      Diagnostic Studies: DG Chest 2 View  Result Date: 05/20/2021 CLINICAL DATA:  56 year old male with history of cardiac surgery. EXAM: CHEST - 2 VIEW COMPARISON:  Chest x-ray 05/13/2021. FINDINGS: Lung volumes are low. Bibasilar opacities favored to reflect areas of subsegmental atelectasis. Small bilateral pleural effusions. No pneumothorax. No evidence of pulmonary edema. Heart size is normal. Upper mediastinal contours are within normal  limits. Atherosclerotic calcifications are noted in the thoracic aorta. IMPRESSION: 1. Bibasilar subsegmental atelectasis and small bilateral pleural effusions. Electronically Signed   By: Vinnie Langton M.D.   On: 05/20/2021 08:42   DG Chest 2 View  Result Date: 05/13/2021 CLINICAL DATA:  Encounter for atrial myxoma. EXAM: CHEST - 2 VIEW COMPARISON:  Chest radiograph dated May 12, 2021 FINDINGS: The heart is markedly enlarged. Low lung volumes. Mild pulmonary vascular congestion. No large pleural effusion or pneumothorax. Interval removal of the right IJ access central line sheath. No acute osseous abnormality. Sternotomy wires are intact. IMPRESSION: Stable cardiomegaly with mild pulmonary vascular congestion. Low lung volumes. No large pleural effusion or pneumothorax. Electronically Signed   By: Keane Police D.O.   On: 05/13/2021 08:14   DG Chest 2 View  Result Date: 05/08/2021 CLINICAL DATA:  56 year old male with a history of atrial mass EXAM: CHEST - 2 VIEW COMPARISON:  08/08/2016 FINDINGS: Cardiomediastinal silhouette unchanged in size and contour. No evidence of central vascular congestion. No interlobular septal thickening. No pneumothorax or pleural effusion. Coarsened interstitial markings, with no confluent airspace disease. No acute displaced fracture. Degenerative changes of the spine. IMPRESSION: No active cardiopulmonary disease. Electronically Signed   By: Corrie Mckusick D.O.   On: 05/08/2021 15:01   DG Chest [facility] [address]  Result Date: 05/12/2021 CLINICAL DATA:  Chest pain EXAM: PORTABLE CHEST 1 VIEW COMPARISON:  Radiograph 05/11/2021 FINDINGS: Stable position of right neck central catheter sheath. Prior median sternotomy. Unchanged, enlarged cardiomediastinal silhouette. Perihilar predominant interstitial opacities and bibasilar airspace opacities. Probable trace effusions. No visible pneumothorax. No acute osseous abnormality. IMPRESSION: Mild interstitial edema with bibasilar  opacities, likely atelectasis. Trace effusions. Electronically Signed   By: Maurine Simmering M.D.   On: 05/12/2021 08:15   DG Chest Port 1 View  Result Date: 05/11/2021 CLINICAL DATA:  Status post open cardiac surgery, chest tube present EXAM: PORTABLE CHEST 1 VIEW COMPARISON:  Chest radiograph from one day prior. FINDINGS: Right internal jugular central venous sheath terminates over the right thoracic inlet. Intact sternotomy wires. Stable tube overlying the right heart. Stable cardiomediastinal silhouette with mild cardiomegaly. No pneumothorax. No pleural effusion. Low lung volumes, vascular crowding and moderate bibasilar atelectasis, unchanged. No overt pulmonary edema. IMPRESSION: 1. No pneumothorax. 2. Stable low lung volumes and moderate bibasilar atelectasis. 3. Stable mild cardiomegaly without overt pulmonary edema. Electronically Signed   By: Ilona Sorrel M.D.   On: 05/11/2021 08:18   DG Chest Port 1 View  Result Date: 05/10/2021 CLINICAL DATA:  Postop, minimally invasive excision of atrial myxoma, concern for pneumothorax. EXAM: PORTABLE CHEST 1 VIEW COMPARISON:  Radiograph 05/08/2021 FINDINGS: Endotracheal tube overlies the midthoracic trachea. Right neck catheter sheath is in place with retracted internal catheter within the sheath lumen. Nasogastric tube passes below the diaphragm, tip excluded by collimation. Mediastinal drain in place. Borderline enlarged cardiac silhouette, likely accentuated by technique. Mild perihilar interstitial opacities. Small left pleural effusion with adjacent basilar opacity, likely atelectasis. Intact median sternotomy wires. IMPRESSION: Postoperative chest with mild perihilar edema, small left pleural effusion and left basilar atelectasis. No visible pneumothorax. Electronically Signed   By: Maurine Simmering M.D.   On: 05/10/2021 16:15   ECHO INTRAOPERATIVE TEE  Result Date: 05/12/2021  *INTRAOPERATIVE TRANSESOPHAGEAL REPORT *  Patient Name:   Shawn Suarez Date of Exam:  05/10/2021 Medical Rec #:  [id number]    Height:       69.0 in Accession #:    [id number]   Weight:       260.0 lb Date of Birth:  1965/12/23     BSA:          2.31 m Patient Age:    67 years     BP:           161/93 mmHg Patient Gender: M            HR:           50 bpm. Exam Location:  Inpatient Transesophogeal exam was perform intraoperatively during surgical procedure. Patient was closely monitored under general anesthesia during the entirety of examination. Indications:     left atrial myxoma Performing Phys: [place] HENDRICKSON Diagnosing Phys: Suzette Battiest MD Complications: No known complications during this procedure. POST-OP IMPRESSIONS _ Left Ventricle: The left ventricle is unchanged from pre-bypass. _ Right Ventricle: The right ventricle appears unchanged from pre-bypass. _ Aorta: The aorta appears unchanged from pre-bypass.  _ Aortic Valve: The aortic valve appears unchanged from pre-bypass. _ Mitral Valve: The mitral valve appears unchanged from pre-bypass. _ Tricuspid Valve: The tricuspid valve appears unchanged from pre-bypass. _ Pulmonic Valve: The pulmonic valve appears unchanged from pre-bypass. _ Interventricular Septum: The interventricular septum appears unchanged from pre-bypass. _ Pericardium: The pericardium appears unchanged from pre-bypass. _ Comments: Interval removal of left atrial  mass. Color flow doppler does not reveal any flow across interatrial septum after mass removal and pericardial patch of interatrial septum. PRE-OP FINDINGS  Left Ventricle: The left ventricle has normal systolic function, with an ejection fraction of 55-60%. The cavity size was normal. There is no left ventricular hypertrophy. Right Ventricle: The right ventricle has normal systolic function. The cavity was normal. There is no increase in right ventricular wall thickness. Left Atrium: Left atrial size was normal in size. No left atrial/left atrial appendage thrombus was detected. There is a left atrial  mass which is located on the atrial septum and is moderate in size and is mobile and is spherical in shape. Right Atrium: Right atrial size was normal in size. Interatrial Septum: No atrial level shunt detected by color flow Doppler. Pericardium: There is no evidence of pericardial effusion. Mitral Valve: The mitral valve is normal in structure. Mitral valve regurgitation is not visualized by color flow Doppler. There is No evidence of mitral stenosis. Tricuspid Valve: The tricuspid valve was normal in structure. Tricuspid valve regurgitation was not visualized by color flow Doppler. Aortic Valve: The aortic valve is tricuspid Aortic valve regurgitation is trivial by color flow Doppler. There is no stenosis of the aortic valve. Pulmonic Valve: The pulmonic valve was normal in structure. Pulmonic valve regurgitation is not visualized by color flow Doppler.  Suzette Battiest MD Electronically signed by Suzette Battiest MD Signature Date/Time: 05/12/2021/6:35:06 PM    Final       Discharge Medications: Allergies as of 05/22/2021   No Known Allergies      Medication List     STOP taking these medications    metoprolol succinate 25 MG 24 hr tablet Commonly known as: Toprol XL       TAKE these medications    acetaminophen 325 MG tablet Commonly known as: TYLENOL Take 2 tablets (650 mg total) by mouth every 6 (six) hours as needed for headache, mild pain or fever.   amiodarone 200 MG tablet Commonly known as: PACERONE Take 1 tablet (200 mg total) by mouth 2 (two) times daily. For 10 days then take 200 mg daily thereafter   apixaban 5 MG Tabs tablet Commonly known as: ELIQUIS Take 1 tablet (5 mg total) by mouth 2 (two) times daily.   aspirin EC 81 MG tablet Take 1 tablet (81 mg total) by mouth daily. Swallow whole.   cephALEXin 500 MG capsule Commonly known as: KEFLEX Take 1 capsule (500 mg total) by mouth every 8 (eight) hours. Please take until finished   Dulera 100-5 MCG/ACT  Aero Generic drug: mometasone-formoterol Inhale 2 puffs into the lungs 2 (two) times daily as needed (Asthma like symptoms).   furosemide 40 MG tablet Commonly known as: LASIX Take 1 tablet (40 mg total) by mouth daily. Start taking on: May 23, 2021   metoprolol tartrate 25 MG tablet Commonly known as: LOPRESSOR Take 1 tablet (25 mg total) by mouth 3 (three) times daily.   pantoprazole 20 MG tablet Commonly known as: PROTONIX Take 20 mg by mouth daily.   potassium chloride SA 20 MEQ tablet Commonly known as: KLOR-CON M Take 1 tablet (20 mEq total) by mouth daily. Start taking on: May 23, 2021   traMADol 50 MG tablet Commonly known as: ULTRAM Take 1 tablet (50 mg total) by mouth every 6 (six) hours as needed for moderate pain.               Durable Medical Equipment  (From admission, onward)  Start     Ordered   05/15/21 1358  For home use only DME 3 n 1  Once        05/15/21 1357   05/15/21 0751  For home use only DME Walker rolling  Once       Question Answer Comment  Walker: With [employer] Wheels   Patient needs a walker to treat with the following condition Physical deconditioning      05/15/21 0750           The patient has been discharged on:   1.Beta Blocker:  Yes [x   ]                              No   [   ]                              If No, reason:  2.Ace Inhibitor/ARB: Yes [   ]                                     No  [  x  ]                                     If No, reason:Labile BP  3.Statin:   Yes [   ]                  No  [ x  ]                  If No, reason:No CAD  4.Shela Commons:  Yes  [   x]                  No   [   ]                  If No, reason:  Patient had ACS upon admission:No  Plavix/P2Y12 inhibitor: Yes [   ]                                      No  [  x ]   Follow Up Appointments:  Follow-up Information     Imogene Burn, PA-C. Go on 05/31/2021.   Specialty: Cardiology Why: Appointment  time is at 9:45 am Contact information: [address] [place] [address] [phone]         Melrose Nakayama, MD. Go on 06/13/2021.   Specialty: Cardiothoracic Surgery Why: PA/LAT CXR to be taken (at [facility] which is in the same building as Dr. Leonarda Salon office) on 03/07 at 11:15 am;Appointment time is at 11:45 am Contact information: [place] [address] [phone]         [facility] Follow up.   Why: rolling walker and 3n1 arranged- to be delivered to room prior to discharge Contact information: [address] [place] [address] [phone]                 Signed: Sharalyn Ink [facility] 05/22/2021, 8:35 AM

## 2021-05-10 NOTE — Anesthesia Procedure Notes (Signed)
Central Venous Catheter Insertion Performed by: Suzette Battiest, MD, anesthesiologist Start/End2/04/2021 7:50 AM, 05/10/2021 8:05 AM Patient location: Pre-op. Preanesthetic checklist: patient identified, IV checked, site marked, risks and benefits discussed, surgical consent, monitors and equipment checked, pre-op evaluation, timeout performed and anesthesia consent Hand hygiene performed  and maximum sterile barriers used  PA cath was placed.Swan type:thermodilution Procedure performed without using ultrasound guided technique. Attempts: 1 Patient tolerated the procedure well with no immediate complications.

## 2021-05-10 NOTE — Progress Notes (Signed)
°  Echocardiogram Echocardiogram Transesophageal has been performed.  Johny Chess 05/10/2021, 12:53 PM

## 2021-05-10 NOTE — Anesthesia Procedure Notes (Signed)
Arterial Line Insertion Start/End2/04/2021 7:52 AM, 05/10/2021 7:56 AM Performed by: Betha Loa, CRNA, CRNA  Patient location: Pre-op. Preanesthetic checklist: patient identified, IV checked, site marked, risks and benefits discussed, surgical consent, monitors and equipment checked, pre-op evaluation, timeout performed and anesthesia consent Patient sedated Left, radial was placed Catheter size: 20 G Hand hygiene performed  and maximum sterile barriers used   Attempts: 1 Procedure performed without using ultrasound guided technique. Following insertion, Biopatch and dressing applied. Post procedure assessment: normal  Patient tolerated the procedure well with no immediate complications.

## 2021-05-11 ENCOUNTER — Inpatient Hospital Stay (HOSPITAL_COMMUNITY): Payer: Federal, State, Local not specified - PPO

## 2021-05-11 ENCOUNTER — Encounter (HOSPITAL_COMMUNITY): Payer: Self-pay | Admitting: Thoracic Surgery (Cardiothoracic Vascular Surgery)

## 2021-05-11 LAB — GLUCOSE, CAPILLARY
Glucose-Capillary: 140 mg/dL — ABNORMAL HIGH (ref 70–99)
Glucose-Capillary: 147 mg/dL — ABNORMAL HIGH (ref 70–99)
Glucose-Capillary: 148 mg/dL — ABNORMAL HIGH (ref 70–99)
Glucose-Capillary: 150 mg/dL — ABNORMAL HIGH (ref 70–99)
Glucose-Capillary: 153 mg/dL — ABNORMAL HIGH (ref 70–99)
Glucose-Capillary: 154 mg/dL — ABNORMAL HIGH (ref 70–99)
Glucose-Capillary: 155 mg/dL — ABNORMAL HIGH (ref 70–99)
Glucose-Capillary: 160 mg/dL — ABNORMAL HIGH (ref 70–99)
Glucose-Capillary: 173 mg/dL — ABNORMAL HIGH (ref 70–99)
Glucose-Capillary: 195 mg/dL — ABNORMAL HIGH (ref 70–99)
Glucose-Capillary: 212 mg/dL — ABNORMAL HIGH (ref 70–99)

## 2021-05-11 LAB — BASIC METABOLIC PANEL
Anion gap: 6 (ref 5–15)
Anion gap: 8 (ref 5–15)
BUN: 13 mg/dL (ref 6–20)
BUN: 16 mg/dL (ref 6–20)
CO2: 22 mmol/L (ref 22–32)
CO2: 22 mmol/L (ref 22–32)
Calcium: 8.1 mg/dL — ABNORMAL LOW (ref 8.9–10.3)
Calcium: 7.9 mg/dL — ABNORMAL LOW (ref 8.9–10.3)
Chloride: 110 mmol/L (ref 98–111)
Chloride: 106 mmol/L (ref 98–111)
Creatinine, Ser: 1.19 mg/dL (ref 0.61–1.24)
Creatinine, Ser: 1.28 mg/dL — ABNORMAL HIGH (ref 0.61–1.24)
GFR, Estimated: >60 mL/min (ref >60)
GFR, Estimated: >60 mL/min (ref >60)
Glucose, Bld: 139 mg/dL — ABNORMAL HIGH (ref 70–99)
Glucose, Bld: 191 mg/dL — ABNORMAL HIGH (ref 70–99)
Potassium: 4.3 mmol/L (ref 3.5–5.1)
Potassium: 4 mmol/L (ref 3.5–5.1)
Sodium: 138 mmol/L (ref 135–145)
Sodium: 136 mmol/L (ref 135–145)

## 2021-05-11 LAB — CBC
HCT: 30.4 % — ABNORMAL LOW (ref 39.0–52.0)
HCT: 27.7 % — ABNORMAL LOW (ref 39.0–52.0)
Hemoglobin: 10.3 g/dL — ABNORMAL LOW (ref 13.0–17.0)
Hemoglobin: 9.2 g/dL — ABNORMAL LOW (ref 13.0–17.0)
MCH: 29.2 pg (ref 26.0–34.0)
MCH: 28.9 pg (ref 26.0–34.0)
MCHC: 33.9 g/dL (ref 30.0–36.0)
MCHC: 33.2 g/dL (ref 30.0–36.0)
MCV: 86.1 fL (ref 80.0–100.0)
MCV: 87.1 fL (ref 80.0–100.0)
Platelets: 130 10*3/uL — ABNORMAL LOW (ref 150–400)
Platelets: 118 10*3/uL — ABNORMAL LOW (ref 150–400)
RBC: 3.53 MIL/uL — ABNORMAL LOW (ref 4.22–5.81)
RBC: 3.18 MIL/uL — ABNORMAL LOW (ref 4.22–5.81)
RDW: 13.6 % (ref 11.5–15.5)
RDW: 13.9 % (ref 11.5–15.5)
WBC: 15.8 10*3/uL — ABNORMAL HIGH (ref 4.0–10.5)
WBC: 18.2 10*3/uL — ABNORMAL HIGH (ref 4.0–10.5)
nRBC: 0 % (ref 0.0–0.2)
nRBC: 0 % (ref 0.0–0.2)

## 2021-05-11 LAB — MAGNESIUM
Magnesium: 2.6 mg/dL — ABNORMAL HIGH (ref 1.7–2.4)
Magnesium: 2.5 mg/dL — ABNORMAL HIGH (ref 1.7–2.4)

## 2021-05-11 MED ORDER — INSULIN ASPART 100 UNIT/ML IJ SOLN
0.0000 [IU] | INTRAMUSCULAR | Status: DC
  Administered 2021-05-11: 4 [IU] via SUBCUTANEOUS
  Administered 2021-05-11: 8 [IU] via SUBCUTANEOUS
  Administered 2021-05-11: 2 [IU] via SUBCUTANEOUS
  Administered 2021-05-11: 4 [IU] via SUBCUTANEOUS
  Administered 2021-05-12 (×2): 8 [IU] via SUBCUTANEOUS

## 2021-05-11 MED ORDER — ALBUMIN HUMAN 5 % IV SOLN
12.5000 g | Freq: Once | INTRAVENOUS | Status: AC
  Administered 2021-05-11: 12.5 g via INTRAVENOUS
  Filled 2021-05-11: qty 250

## 2021-05-11 MED ORDER — ORAL CARE MOUTH RINSE
15.0000 mL | Freq: Two times a day (BID) | OROMUCOSAL | Status: DC
  Administered 2021-05-11 – 2021-05-21 (×18): 15 mL via OROMUCOSAL

## 2021-05-11 MED ORDER — INSULIN DETEMIR 100 UNIT/ML ~~LOC~~ SOLN
25.0000 [IU] | Freq: Two times a day (BID) | SUBCUTANEOUS | Status: DC
  Administered 2021-05-11 – 2021-05-14 (×7): 25 [IU] via SUBCUTANEOUS
  Filled 2021-05-11 (×11): qty 0.25

## 2021-05-11 MED ORDER — ENOXAPARIN SODIUM 40 MG/0.4ML IJ SOSY
40.0000 mg | PREFILLED_SYRINGE | Freq: Every day | INTRAMUSCULAR | Status: DC
  Administered 2021-05-11 – 2021-05-12 (×2): 40 mg via SUBCUTANEOUS
  Filled 2021-05-11 (×2): qty 0.4

## 2021-05-11 NOTE — Progress Notes (Signed)
1 Day Post-Op Procedure(s) (LRB): MINIMALLY INVASIVE EXCISION OF ATRIAL MYXOMA (Right) PARTIAL CRYOMAZE PROCEDURE (N/A) TRANSESOPHAGEAL ECHOCARDIOGRAM (TEE) MEDIAN STERNOTOMY (N/A) APPLICATION OF CELL SAVER (N/A) Subjective: Some soreness  Objective: Vital signs in last 24 hours: Temp:  [96.8 F (36 C)-100 F (37.8 C)] 99.9 F (37.7 C) (02/02 0700) Pulse Rate:  [54-91] 80 (02/02 0700) Cardiac Rhythm: Atrial paced (02/02 0400) Resp:  [0-26] 13 (02/02 0700) BP: (86-114)/(49-99) 96/72 (02/02 0700) SpO2:  [92 %-100 %] 96 % (02/02 0700) Arterial Line BP: (72-125)/(49-89) 107/57 (02/02 0700) FiO2 (%):  [40 %-50 %] 40 % (02/01 2148) Weight:  [121.1 kg] 121.1 kg (02/02 0600)  Hemodynamic parameters for last 24 hours: PAP: (7-25)/(4-10) 7/4  Intake/Output from previous day: 02/01 0701 - 02/02 0700 In: 4970.3 [I.V.:2538.6; Blood:820; IV Piggyback:1611.7] Out: 4540 [Urine:2360; Blood:2000; Chest Tube:180] Intake/Output this shift: No intake/output data recorded.  No distress A & O x 3. No focal deficit Cardiac RRR, no rub or murmur Lungs decreased at bases Dressings clean and dry RLE N/V intact  Lab Results: Recent Labs    05/10/21 2113 05/10/21 2212 05/10/21 2332 05/11/21 0412  WBC 16.9*  --   --  15.8*  HGB 11.3*   < > 9.9* 10.3*  HCT 32.8*   < > 29.0* 30.4*  PLT 133*  --   --  130*   < > = values in this interval not displayed.   BMET:  Recent Labs    05/10/21 2113 05/10/21 2212 05/10/21 2332 05/11/21 0412  NA 134*   < > 140 138  K 4.6   < > 4.6 4.3  CL 107  --   --  110  CO2 19*  --   --  22  GLUCOSE 136*  --   --  139*  BUN 13  --   --  13  CREATININE 1.26*  --   --  1.19  CALCIUM 7.9*  --   --  8.1*   < > = values in this interval not displayed.    PT/INR:  Recent Labs    05/10/21 2113  LABPROT 16.8*  INR 1.4*   ABG    Component Value Date/Time   PHART 7.325 (L) 05/10/2021 2332   HCO3 23.5 05/10/2021 2332   TCO2 25 05/10/2021 2332    ACIDBASEDEF 2.0 05/10/2021 2332   O2SAT 97.0 05/10/2021 2332   CBG (last 3)  Recent Labs    05/11/21 0411 05/11/21 0641 05/11/21 0751  GLUCAP 150* 154* 160*    Assessment/Plan: S/P Procedure(s) (LRB): MINIMALLY INVASIVE EXCISION OF ATRIAL MYXOMA (Right) PARTIAL CRYOMAZE PROCEDURE (N/A) TRANSESOPHAGEAL ECHOCARDIOGRAM (TEE) MEDIAN STERNOTOMY (N/A) APPLICATION OF CELL SAVER (N/A) POD # 1 resection LA myxoma NEURO- intact CV- in Sr, still on neo  Will give albumin this AM, wean neo as BP allows  Beta blocker, aspirin  S/p cryo PVI for paroxsymal A fib- in SR RESP- IS for atelectasis RENAL- creatinine normal  Diurese once BP allows ENDO- CBG moderately elevated on relatively high dose of insulin drip  Transition to levemir + SSI Advance diet DC Chest  SCD + enoxaparin for DVT prophylaxis Mobilize   LOS: 1 day    Melrose Nakayama 05/11/2021

## 2021-05-11 NOTE — Anesthesia Postprocedure Evaluation (Signed)
Anesthesia Post Note  Patient: CAIDEN ARTEAGA  Procedure(s) Performed: MINIMALLY INVASIVE EXCISION OF ATRIAL MYXOMA (Right) PARTIAL CRYOMAZE PROCEDURE TRANSESOPHAGEAL ECHOCARDIOGRAM (TEE) APPLICATION OF CELL SAVER MEDIAN STERNOTOMY     Patient location during evaluation: SICU Anesthesia Type: General Level of consciousness: sedated Pain management: pain level controlled Vital Signs Assessment: post-procedure vital signs reviewed and stable Respiratory status: patient remains intubated per anesthesia plan Cardiovascular status: stable Postop Assessment: no apparent nausea or vomiting Anesthetic complications: no   No notable events documented.  Last Vitals:  Vitals:   05/11/21 1230 05/11/21 1245  BP: 104/81 105/73  Pulse: 79 80  Resp: (!) 24 19  Temp:    SpO2: 96% 96%    Last Pain:  Vitals:   05/11/21 1222  TempSrc: Oral  PainSc:                  Tiajuana Amass

## 2021-05-11 NOTE — Progress Notes (Signed)
° °   °  [address]       [place] [address]             [phone]      POD # 1 resection left atrial myxoma  BP 107/69    Pulse 80    Temp 98.6 F (37 C) (Oral)    Resp 16    Ht 5\' 9"  (1.753 m)    Wt 121.1 kg    SpO2 95%    BMI 39.43 kg/m  Still on neo at 15   Intake/Output Summary (Last 24 hours) at 05/11/2021 1918 Last data filed at 05/11/2021 1800 Gross per 24 hour  Intake 2978.04 ml  Output 1150 ml  Net 1828.04 ml   Creatinine 1.28 Hct 27, PLT 118K  Doing well POD # 1 Wean neo as BP allows  Remo Lipps C. Roxan Hockey, MD [facility] [phone]

## 2021-05-11 NOTE — Progress Notes (Signed)
°  Transition of Care [facility]) Screening Note   Patient Details  Name: Shawn Suarez Date of Birth: 08-Aug-1965   Transition of Care [facility]) CM/SW Contact:    Milas Gain, [place] Phone Number: 05/11/2021, 5:14 PM    Transition of Care Department [facility]) has reviewed patient and no TOC needs have been identified at this time. We will continue to monitor patient advancement through interdisciplinary progression rounds. If new patient transition needs arise, please place a TOC consult.

## 2021-05-12 ENCOUNTER — Inpatient Hospital Stay (HOSPITAL_COMMUNITY): Payer: Federal, State, Local not specified - PPO

## 2021-05-12 LAB — BASIC METABOLIC PANEL
Anion gap: 6 (ref 5–15)
BUN: 17 mg/dL (ref 6–20)
CO2: 23 mmol/L (ref 22–32)
Calcium: 8.3 mg/dL — ABNORMAL LOW (ref 8.9–10.3)
Chloride: 105 mmol/L (ref 98–111)
Creatinine, Ser: 1.25 mg/dL — ABNORMAL HIGH (ref 0.61–1.24)
GFR, Estimated: >60 mL/min (ref >60)
Glucose, Bld: 205 mg/dL — ABNORMAL HIGH (ref 70–99)
Potassium: 4.2 mmol/L (ref 3.5–5.1)
Sodium: 134 mmol/L — ABNORMAL LOW (ref 135–145)

## 2021-05-12 LAB — CBC
HCT: 26.6 % — ABNORMAL LOW (ref 39.0–52.0)
Hemoglobin: 9 g/dL — ABNORMAL LOW (ref 13.0–17.0)
MCH: 29.6 pg (ref 26.0–34.0)
MCHC: 33.8 g/dL (ref 30.0–36.0)
MCV: 87.5 fL (ref 80.0–100.0)
Platelets: 110 10*3/uL — ABNORMAL LOW (ref 150–400)
RBC: 3.04 MIL/uL — ABNORMAL LOW (ref 4.22–5.81)
RDW: 14 % (ref 11.5–15.5)
WBC: 16.9 10*3/uL — ABNORMAL HIGH (ref 4.0–10.5)
nRBC: 0 % (ref 0.0–0.2)

## 2021-05-12 LAB — ECHO INTRAOPERATIVE TEE
Height: 69 in
Weight: 4160 oz

## 2021-05-12 LAB — GLUCOSE, CAPILLARY
Glucose-Capillary: 203 mg/dL — ABNORMAL HIGH (ref 70–99)
Glucose-Capillary: 233 mg/dL — ABNORMAL HIGH (ref 70–99)
Glucose-Capillary: 217 mg/dL — ABNORMAL HIGH (ref 70–99)
Glucose-Capillary: 166 mg/dL — ABNORMAL HIGH (ref 70–99)
Glucose-Capillary: 170 mg/dL — ABNORMAL HIGH (ref 70–99)

## 2021-05-12 LAB — SURGICAL PATHOLOGY

## 2021-05-12 MED ORDER — POTASSIUM CHLORIDE CRYS ER 10 MEQ PO TBCR
20.0000 meq | EXTENDED_RELEASE_TABLET | Freq: Every day | ORAL | Status: DC
  Administered 2021-05-12 – 2021-05-17 (×6): 20 meq via ORAL
  Filled 2021-05-12 (×9): qty 2

## 2021-05-12 MED ORDER — LACTULOSE 10 GM/15ML PO SOLN: 20.0000 g | Freq: Every day | ORAL | Status: DC | PRN

## 2021-05-12 MED ORDER — CHLORHEXIDINE GLUCONATE CLOTH 2 % EX PADS
6.0000 | MEDICATED_PAD | Freq: Every day | CUTANEOUS | Status: DC
  Administered 2021-05-12 – 2021-05-14 (×3): 6 via TOPICAL

## 2021-05-12 MED ORDER — ASPIRIN EC 81 MG PO TBEC
81.0000 mg | DELAYED_RELEASE_TABLET | Freq: Every day | ORAL | Status: DC
  Administered 2021-05-12 – 2021-05-22 (×11): 81 mg via ORAL
  Filled 2021-05-12 (×11): qty 1

## 2021-05-12 MED ORDER — FUROSEMIDE 40 MG PO TABS
40.0000 mg | ORAL_TABLET | Freq: Every day | ORAL | Status: DC
  Administered 2021-05-12 – 2021-05-16 (×5): 40 mg via ORAL
  Filled 2021-05-12 (×5): qty 1

## 2021-05-12 MED ORDER — INSULIN ASPART 100 UNIT/ML IJ SOLN
0.0000 [IU] | Freq: Three times a day (TID) | INTRAMUSCULAR | Status: DC
  Administered 2021-05-12: 5 [IU] via SUBCUTANEOUS
  Administered 2021-05-12 – 2021-05-13 (×3): 3 [IU] via SUBCUTANEOUS

## 2021-05-12 MED ORDER — SODIUM CHLORIDE 0.9% FLUSH: 3.0000 mL | INTRAVENOUS | Status: DC | PRN

## 2021-05-12 MED ORDER — AMIODARONE HCL IN DEXTROSE 360-4.14 MG/200ML-% IV SOLN
30.0000 mg/h | INTRAVENOUS | Status: DC
  Administered 2021-05-13 (×2): 30 mg/h via INTRAVENOUS
  Filled 2021-05-12 (×5): qty 200

## 2021-05-12 MED ORDER — SODIUM CHLORIDE 0.9% FLUSH
3.0000 mL | Freq: Two times a day (BID) | INTRAVENOUS | Status: DC
  Administered 2021-05-12 – 2021-05-18 (×9): 3 mL via INTRAVENOUS

## 2021-05-12 MED ORDER — AMIODARONE LOAD VIA INFUSION
150.0000 mg | Freq: Once | INTRAVENOUS | Status: AC
  Administered 2021-05-12: 150 mg via INTRAVENOUS
  Filled 2021-05-12: qty 83.34

## 2021-05-12 MED ORDER — ~~LOC~~ CARDIAC SURGERY, PATIENT & FAMILY EDUCATION: Freq: Once | Status: AC

## 2021-05-12 MED ORDER — ALUM & MAG HYDROXIDE-SIMETH 200-200-20 MG/5ML PO SUSP
15.0000 mL | Freq: Four times a day (QID) | ORAL | Status: DC | PRN
  Administered 2021-05-12: 15 mL via ORAL
  Filled 2021-05-12: qty 30

## 2021-05-12 MED ORDER — SODIUM CHLORIDE 0.9 % IV SOLN: 250.0000 mL | INTRAVENOUS | Status: DC | PRN

## 2021-05-12 MED ORDER — MAGNESIUM HYDROXIDE 400 MG/5ML PO SUSP: 30.0000 mL | Freq: Every day | ORAL | Status: DC | PRN

## 2021-05-12 MED ORDER — ZOLPIDEM TARTRATE 5 MG PO TABS: 5.0000 mg | ORAL_TABLET | Freq: Every evening | ORAL | Status: DC | PRN

## 2021-05-12 MED ORDER — METOCLOPRAMIDE HCL 5 MG/ML IJ SOLN
10.0000 mg | Freq: Four times a day (QID) | INTRAMUSCULAR | Status: AC
  Administered 2021-05-12 – 2021-05-13 (×4): 10 mg via INTRAVENOUS
  Filled 2021-05-12 (×4): qty 2

## 2021-05-12 MED ORDER — AMIODARONE HCL IN DEXTROSE 360-4.14 MG/200ML-% IV SOLN
60.0000 mg/h | INTRAVENOUS | Status: AC
  Administered 2021-05-12 (×2): 60 mg/h via INTRAVENOUS
  Filled 2021-05-12 (×2): qty 200

## 2021-05-12 NOTE — Plan of Care (Signed)
Nutrition Education Note  RD consulted for nutrition education. 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 Pt with pre-DM, CAD  Lab Results  Component Value Date   HGBA1C 6.3 (H) 05/08/2021    RD provided verbal education on a general healthy diet. RD also provided "Heart Healthy Nutrition Therapy" handout from the Academy of [employer] and [employer]etics in addition to several other healthy diet ed handouts.  Reviewed patient's dietary recall. Pt eats regularly throughout the day-explained to patient that this is very important with regards to blood sugar management. Pt does not follow any sort-of diet as outpatient but  has a varied diet. Pt does not drink sugar sweetened beverages; encouraged pt to continue to do this. Encouraged pt to follow and general healthy diet by increasing fiber intake, limiting salt, choosing healthy fats-limiting fried food.  Pt reports he used to be very active but over the past year his physical activity declined significantly due to "sickness." Encouraged pt to begin incorporating more physical activity post discharge; initially this can be just as simple as taking a walk. Expect good compliance.  Body mass index is 39.95 kg/m.   Current diet order is Carb Mod.  Pt reports nausea-improves with zofran, poor appetite at present.  Labs and medications reviewed. No further nutrition interventions warranted at this time. RD contact information provided. If additional nutrition issues arise, please re-consult RD.  Kerman Passey MS, RDN, LDN, CNSC Registered Dietitian III Clinical Nutrition RD Pager and On-Call Pager Number Located in [place]

## 2021-05-12 NOTE — Progress Notes (Signed)
CARDIAC REHAB PHASE I   Offered to walk with pt. Pt states ambulation earlier today that wore him out. Pt educated on importance of sternal precautions, IS use, and ambulation. Pt and family deny further questions or concerns at this time. Will continue to follow.  [id number] Rufina Falco, RN BSN 05/12/2021 2:11 PM

## 2021-05-12 NOTE — Progress Notes (Signed)
Patient having short runs of PAC's and PVC's, physician on call notified. To continue monitoring the patient's telemetry and proceed transfer to 4E.

## 2021-05-12 NOTE — Progress Notes (Signed)
2 Days Post-Op Procedure(s) (LRB): MINIMALLY INVASIVE EXCISION OF ATRIAL MYXOMA (Right) PARTIAL CRYOMAZE PROCEDURE (N/A) TRANSESOPHAGEAL ECHOCARDIOGRAM (TEE) MEDIAN STERNOTOMY (N/A) APPLICATION OF CELL SAVER (N/A) Subjective: Some nausea last night, tried unsuccessfully to have BM this AM Still some incisional pain, but better   Objective: Vital signs in last 24 hours: Temp:  [97.8 F (36.6 C)-99.7 F (37.6 C)] 98.6 F (37 C) (02/02 1946) Pulse Rate:  [77-85] 80 (02/03 0645) Cardiac Rhythm: Atrial paced (02/03 0400) Resp:  [10-26] 18 (02/03 0645) BP: (89-125)/(52-104) 104/75 (02/03 0645) SpO2:  [92 %-96 %] 93 % (02/03 0645) Weight:  [122.7 kg] 122.7 kg (02/03 0500)  Hemodynamic parameters for last 24 hours:    Intake/Output from previous day: 02/02 0701 - 02/03 0700 In: 1871.4 [P.O.:720; I.V.:801.6; IV Piggyback:349.8] Out: 795 [Urine:790; Chest Tube:5] Intake/Output this shift: No intake/output data recorded.  General appearance: alert, cooperative, and no distress Neurologic: intact Heart: regular rate and rhythm Lungs: diminished breath sounds bibasilar Abdomen: distended, tympanitic, nontender  Lab Results: Recent Labs    05/11/21 1615 05/12/21 0447  WBC 18.2* 16.9*  HGB 9.2* 9.0*  HCT 27.7* 26.6*  PLT 118* 110*   BMET:  Recent Labs    05/11/21 1615 05/12/21 0447  NA 136 134*  K 4.0 4.2  CL 106 105  CO2 22 23  GLUCOSE 191* 205*  BUN 16 17  CREATININE 1.28* 1.25*  CALCIUM 7.9* 8.3*    PT/INR:  Recent Labs    05/10/21 2113  LABPROT 16.8*  INR 1.4*   ABG    Component Value Date/Time   PHART 7.325 (L) 05/10/2021 2332   HCO3 23.5 05/10/2021 2332   TCO2 25 05/10/2021 2332   ACIDBASEDEF 2.0 05/10/2021 2332   O2SAT 97.0 05/10/2021 2332   CBG (last 3)  Recent Labs    05/11/21 2319 05/12/21 0359 05/12/21 0829  GLUCAP 212* 203* 233*    Assessment/Plan: S/P Procedure(s) (LRB): MINIMALLY INVASIVE EXCISION OF ATRIAL MYXOMA  (Right) PARTIAL CRYOMAZE PROCEDURE (N/A) TRANSESOPHAGEAL ECHOCARDIOGRAM (TEE) MEDIAN STERNOTOMY (N/A) APPLICATION OF CELL SAVER (N/A) Plan for transfer to step-down: see transfer orders 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 NEURO- intact CV in SR in 80s- dc pacing  ASA, beta blocker  Hx paroxysmal A fib, s/p cryo PV isolation- in SR  If a fib will treat with amiodarone +/- anticoagulation RESP- IS for atelectasis RENAL- creatinine mildly elevated, stable, monitor  Fluid overload- diurese ENDO- CBG elevated  No history of DM but A1c was 6.3 (prediabetes) preop  Continue levemir for now, change SSi to Ac and HS GI- distended, hypoactive BS- c/w ileus  Will give Reglan for 24 hours, diet as tolerated Anemia secondary to ABL- stable, monitor SCD + enoxaparin for DVT prophylaxis Continue cardiac rehab   LOS: 2 days    Melrose Nakayama 05/12/2021

## 2021-05-12 NOTE — Progress Notes (Signed)
EVENING ROUNDS NOTE :     [address]       [place] [address]             [phone]                 2 Days Post-Op Procedure(s) (LRB): MINIMALLY INVASIVE EXCISION OF ATRIAL MYXOMA (Right) PARTIAL CRYOMAZE PROCEDURE (N/A) TRANSESOPHAGEAL ECHOCARDIOGRAM (TEE) MEDIAN STERNOTOMY (N/A) APPLICATION OF CELL SAVER (N/A)   Total Length of Stay:  LOS: 2 days  Events:   No events Resting comfortably    BP 119/75 (BP Location: Right Arm)    Pulse 76    Temp 98.4 F (36.9 C) (Oral)    Resp 15    Ht 5\' 9"  (1.753 m)    Wt 122.7 kg    SpO2 95%    BMI 39.95 kg/m          sodium chloride      I/O last 3 completed shifts: In: 3396.6 [P.O.:720; I.V.:1415.4; IV Piggyback:1261.2] Out: 0272 [ZDGUY:4034; Chest Tube:125]   CBC Latest Ref Rng & Units 05/12/2021 05/11/2021 05/11/2021  WBC 4.0 - 10.5 K/uL 16.9(H) 18.2(H) 15.8(H)  Hemoglobin 13.0 - 17.0 g/dL 9.0(L) 9.2(L) 10.3(L)  Hematocrit 39.0 - 52.0 % 26.6(L) 27.7(L) 30.4(L)  Platelets 150 - 400 K/uL 110(L) 118(L) 130(L)    BMP Latest Ref Rng & Units 05/12/2021 05/11/2021 05/11/2021  Glucose 70 - 99 mg/dL 205(H) 191(H) 139(H)  BUN 6 - 20 mg/dL 17 16 13   Creatinine 0.61 - 1.24 mg/dL 1.25(H) 1.28(H) 1.19  BUN/Creat Ratio 9 - 20 - - -  Sodium 135 - 145 mmol/L 134(L) 136 138  Potassium 3.5 - 5.1 mmol/L 4.2 4.0 4.3  Chloride 98 - 111 mmol/L 105 106 110  CO2 22 - 32 mmol/L 23 22 22   Calcium 8.9 - 10.3 mg/dL 8.3(L) 7.9(L) 8.1(L)    ABG    Component Value Date/Time   PHART 7.325 (L) 05/10/2021 2332   PCO2ART 45.3 05/10/2021 2332   PO2ART 97 05/10/2021 2332   HCO3 23.5 05/10/2021 2332   TCO2 25 05/10/2021 2332   ACIDBASEDEF 2.0 05/10/2021 2332   O2SAT 97.0 05/10/2021 2332       Melodie Bouillon, MD 05/12/2021 4:31 PM

## 2021-05-13 ENCOUNTER — Inpatient Hospital Stay (HOSPITAL_COMMUNITY): Payer: Federal, State, Local not specified - PPO

## 2021-05-13 LAB — CBC
HCT: 26.6 % — ABNORMAL LOW (ref 39.0–52.0)
Hemoglobin: 8.8 g/dL — ABNORMAL LOW (ref 13.0–17.0)
MCH: 28.8 pg (ref 26.0–34.0)
MCHC: 33.1 g/dL (ref 30.0–36.0)
MCV: 86.9 fL (ref 80.0–100.0)
Platelets: 137 10*3/uL — ABNORMAL LOW (ref 150–400)
RBC: 3.06 MIL/uL — ABNORMAL LOW (ref 4.22–5.81)
RDW: 14.4 % (ref 11.5–15.5)
WBC: 17.3 10*3/uL — ABNORMAL HIGH (ref 4.0–10.5)
nRBC: 0.3 % — ABNORMAL HIGH (ref 0.0–0.2)

## 2021-05-13 LAB — GLUCOSE, CAPILLARY
Glucose-Capillary: 156 mg/dL — ABNORMAL HIGH (ref 70–99)
Glucose-Capillary: 165 mg/dL — ABNORMAL HIGH (ref 70–99)
Glucose-Capillary: 109 mg/dL — ABNORMAL HIGH (ref 70–99)
Glucose-Capillary: 103 mg/dL — ABNORMAL HIGH (ref 70–99)

## 2021-05-13 LAB — BASIC METABOLIC PANEL
Anion gap: 9 (ref 5–15)
BUN: 20 mg/dL (ref 6–20)
CO2: 24 mmol/L (ref 22–32)
Calcium: 8.7 mg/dL — ABNORMAL LOW (ref 8.9–10.3)
Chloride: 103 mmol/L (ref 98–111)
Creatinine, Ser: 1.18 mg/dL (ref 0.61–1.24)
GFR, Estimated: >60 mL/min (ref >60)
Glucose, Bld: 159 mg/dL — ABNORMAL HIGH (ref 70–99)
Potassium: 4.4 mmol/L (ref 3.5–5.1)
Sodium: 136 mmol/L (ref 135–145)

## 2021-05-13 MED ORDER — APIXABAN 5 MG PO TABS
5.0000 mg | ORAL_TABLET | Freq: Two times a day (BID) | ORAL | Status: DC
  Administered 2021-05-13 – 2021-05-22 (×19): 5 mg via ORAL
  Filled 2021-05-13 (×20): qty 1

## 2021-05-13 MED ORDER — METOPROLOL TARTRATE 25 MG PO TABS
25.0000 mg | ORAL_TABLET | Freq: Two times a day (BID) | ORAL | Status: DC
  Administered 2021-05-13 – 2021-05-14 (×3): 25 mg via ORAL
  Filled 2021-05-13 (×3): qty 1

## 2021-05-13 NOTE — Progress Notes (Signed)
CARDIAC REHAB PHASE I   Went to offer to walk with pt. Recently ambulated with RN. Not for d/c this weekend. Will continue to follow.  Rufina Falco, RN BSN 05/13/2021 11:44 AM

## 2021-05-13 NOTE — Progress Notes (Signed)
1150:  PMW d/c'd.  No resistance met.  Pt resting supine.103/53 HR 65.

## 2021-05-13 NOTE — Progress Notes (Signed)
° °   °  [address]       [place] [address]             [phone]                 3 Days Post-Op Procedure(s) (LRB): MINIMALLY INVASIVE EXCISION OF ATRIAL MYXOMA (Right) PARTIAL CRYOMAZE PROCEDURE (N/A) TRANSESOPHAGEAL ECHOCARDIOGRAM (TEE) MEDIAN STERNOTOMY (N/A) APPLICATION OF CELL SAVER (N/A)   Events: In an out of afib _______________________________________________________________ Vitals: BP 114/74    Pulse 60    Temp 98.3 F (36.8 C) (Oral)    Resp 16    Ht 5\' 9"  (1.753 m)    Wt 122 kg    SpO2 94%    BMI 39.72 kg/m  Filed Weights   05/11/21 0600 05/12/21 0500 05/13/21 0433  Weight: 121.1 kg 122.7 kg 122 kg     - Neuro: alert NAD  - Cardiovascular: afib  Drips: amio 30.      - Pulm: EWOB    ABG    Component Value Date/Time   PHART 7.325 (L) 05/10/2021 2332   PCO2ART 45.3 05/10/2021 2332   PO2ART 97 05/10/2021 2332   HCO3 23.5 05/10/2021 2332   TCO2 25 05/10/2021 2332   ACIDBASEDEF 2.0 05/10/2021 2332   O2SAT 97.0 05/10/2021 2332    - Abd: ND - Extremity: warm  .Intake/Output      02/03 0701 02/04 0700 02/04 0701 02/05 0700   P.O. 470    I.V. (mL/kg) 396.3 (3.2)    IV Piggyback     Total Intake(mL/kg) 866.3 (7.1)    Urine (mL/kg/hr) 600 (0.2)    Chest Tube     Total Output 600    Net +266.3            _______________________________________________________________ Labs: CBC Latest Ref Rng & Units 05/13/2021 05/12/2021 05/11/2021  WBC 4.0 - 10.5 K/uL 17.3(H) 16.9(H) 18.2(H)  Hemoglobin 13.0 - 17.0 g/dL 8.8(L) 9.0(L) 9.2(L)  Hematocrit 39.0 - 52.0 % 26.6(L) 26.6(L) 27.7(L)  Platelets 150 - 400 K/uL 137(L) 110(L) 118(L)   CMP Latest Ref Rng & Units 05/13/2021 05/12/2021 05/11/2021  Glucose 70 - 99 mg/dL 159(H) 205(H) 191(H)  BUN 6 - 20 mg/dL 20 17 16   Creatinine 0.61 - 1.24 mg/dL 1.18 1.25(H) 1.28(H)  Sodium 135 - 145 mmol/L 136 134(L) 136  Potassium 3.5 - 5.1 mmol/L 4.4 4.2 4.0  Chloride 98 - 111 mmol/L 103 105 106  CO2 22 -  32 mmol/L 24 23 22   Calcium 8.9 - 10.3 mg/dL 8.7(L) 8.3(L) 7.9(L)  Total Protein 6.5 - 8.1 g/dL - - -  Total Bilirubin 0.3 - 1.2 mg/dL - - -  Alkaline Phos 38 - 126 U/L - - -  AST 15 - 41 U/L - - -  ALT 0 - 44 U/L - - -    CXR: clear  _______________________________________________________________  Assessment and Plan: POD 3 s/p myxoma excision  Neuro: pain controlled CV: increasing BB.  Will remove wires.  Will start eliquis.  Will keep amio gtt Pulm: pulm hyg Renal: on lasix, creat stable GI: on diet Heme: stable ID: [email or web address] Endo: SSI Dispo: floor   Rimas Gilham O Trustin Chapa 05/13/2021 10:06 AM

## 2021-05-14 LAB — GLUCOSE, CAPILLARY
Glucose-Capillary: 101 mg/dL — ABNORMAL HIGH (ref 70–99)
Glucose-Capillary: 118 mg/dL — ABNORMAL HIGH (ref 70–99)
Glucose-Capillary: 98 mg/dL (ref 70–99)
Glucose-Capillary: 93 mg/dL (ref 70–99)

## 2021-05-14 MED ORDER — SODIUM CHLORIDE 0.9 % IV SOLN: 250.0000 mL | INTRAVENOUS | Status: DC | PRN

## 2021-05-14 MED ORDER — AMIODARONE IV BOLUS ONLY 150 MG/100ML
150.0000 mg | Freq: Once | INTRAVENOUS | Status: AC
  Administered 2021-05-14: 150 mg via INTRAVENOUS
  Filled 2021-05-14: qty 100

## 2021-05-14 MED ORDER — SODIUM CHLORIDE 0.9% FLUSH
3.0000 mL | Freq: Two times a day (BID) | INTRAVENOUS | Status: DC
  Administered 2021-05-14 – 2021-05-18 (×6): 3 mL via INTRAVENOUS

## 2021-05-14 MED ORDER — AMIODARONE HCL 200 MG PO TABS
400.0000 mg | ORAL_TABLET | Freq: Two times a day (BID) | ORAL | Status: DC
  Administered 2021-05-14 – 2021-05-22 (×17): 400 mg via ORAL
  Filled 2021-05-14 (×17): qty 2

## 2021-05-14 MED ORDER — METOPROLOL TARTRATE 5 MG/5ML IV SOLN
2.5000 mg | Freq: Once | INTRAVENOUS | Status: AC
  Administered 2021-05-14: 2.5 mg via INTRAVENOUS

## 2021-05-14 MED ORDER — ~~LOC~~ CARDIAC SURGERY, PATIENT & FAMILY EDUCATION
Freq: Once | Status: AC
  Administered 2021-05-14: 1

## 2021-05-14 MED ORDER — SODIUM CHLORIDE 0.9% FLUSH: 3.0000 mL | INTRAVENOUS | Status: DC | PRN

## 2021-05-14 NOTE — Progress Notes (Signed)
° °   °  [address]       [place] [address]             [phone]                 4 Days Post-Op Procedure(s) (LRB): MINIMALLY INVASIVE EXCISION OF ATRIAL MYXOMA (Right) PARTIAL CRYOMAZE PROCEDURE (N/A) TRANSESOPHAGEAL ECHOCARDIOGRAM (TEE) MEDIAN STERNOTOMY (N/A) APPLICATION OF CELL SAVER (N/A)   Events: No events overnight _______________________________________________________________ Vitals: BP 118/70    Pulse 65    Temp 98.8 F (37.1 C) (Oral)    Resp 16    Ht 5\' 9"  (1.753 m)    Wt 121.7 kg    SpO2 98%    BMI 39.62 kg/m  Filed Weights   05/12/21 0500 05/13/21 0433 05/14/21 0500  Weight: 122.7 kg 122 kg 121.7 kg     - Neuro: alert NAD  - Cardiovascular: sinus  Drips: amio 30.      - Pulm: EWOB    ABG    Component Value Date/Time   PHART 7.325 (L) 05/10/2021 2332   PCO2ART 45.3 05/10/2021 2332   PO2ART 97 05/10/2021 2332   HCO3 23.5 05/10/2021 2332   TCO2 25 05/10/2021 2332   ACIDBASEDEF 2.0 05/10/2021 2332   O2SAT 97.0 05/10/2021 2332    - Abd: ND - Extremity: warm  .Intake/Output      02/04 0701 02/05 0700 02/05 0701 02/06 0700   P.O.     I.V. (mL/kg) 455.7 (3.7)    Total Intake(mL/kg) 455.7 (3.7)    Urine (mL/kg/hr) 400 (0.1)    Stool 0    Total Output 400    Net +55.7         Stool Occurrence 1 x       _______________________________________________________________ Labs: CBC Latest Ref Rng & Units 05/13/2021 05/12/2021 05/11/2021  WBC 4.0 - 10.5 K/uL 17.3(H) 16.9(H) 18.2(H)  Hemoglobin 13.0 - 17.0 g/dL 8.8(L) 9.0(L) 9.2(L)  Hematocrit 39.0 - 52.0 % 26.6(L) 26.6(L) 27.7(L)  Platelets 150 - 400 K/uL 137(L) 110(L) 118(L)   CMP Latest Ref Rng & Units 05/13/2021 05/12/2021 05/11/2021  Glucose 70 - 99 mg/dL 159(H) 205(H) 191(H)  BUN 6 - 20 mg/dL 20 17 16   Creatinine 0.61 - 1.24 mg/dL 1.18 1.25(H) 1.28(H)  Sodium 135 - 145 mmol/L 136 134(L) 136  Potassium 3.5 - 5.1 mmol/L 4.4 4.2 4.0  Chloride 98 - 111 mmol/L 103 105 106  CO2  22 - 32 mmol/L 24 23 22   Calcium 8.9 - 10.3 mg/dL 8.7(L) 8.3(L) 7.9(L)  Total Protein 6.5 - 8.1 g/dL - - -  Total Bilirubin 0.3 - 1.2 mg/dL - - -  Alkaline Phos 38 - 126 U/L - - -  AST 15 - 41 U/L - - -  ALT 0 - 44 U/L - - -    CXR: -  _______________________________________________________________  Assessment and Plan: POD 4 s/p myxoma excision  Neuro: pain controlled CV: PO amio on eliquis Pulm: pulm hyg Renal: on lasix, creat stable GI: on diet Heme: stable ID: afebrile Endo: SSI Dispo: floor   Shawn Suarez 05/14/2021 10:33 AM

## 2021-05-15 ENCOUNTER — Other Ambulatory Visit (HOSPITAL_COMMUNITY): Payer: Self-pay

## 2021-05-15 LAB — GLUCOSE, CAPILLARY
Glucose-Capillary: 96 mg/dL (ref 70–99)
Glucose-Capillary: 111 mg/dL — ABNORMAL HIGH (ref 70–99)
Glucose-Capillary: 96 mg/dL (ref 70–99)
Glucose-Capillary: 112 mg/dL — ABNORMAL HIGH (ref 70–99)

## 2021-05-15 LAB — BASIC METABOLIC PANEL
Anion gap: 11 (ref 5–15)
BUN: 21 mg/dL — ABNORMAL HIGH (ref 6–20)
CO2: 26 mmol/L (ref 22–32)
Calcium: 8.6 mg/dL — ABNORMAL LOW (ref 8.9–10.3)
Chloride: 99 mmol/L (ref 98–111)
Creatinine, Ser: 1.13 mg/dL (ref 0.61–1.24)
GFR, Estimated: >60 mL/min (ref >60)
Glucose, Bld: 135 mg/dL — ABNORMAL HIGH (ref 70–99)
Potassium: 3.8 mmol/L (ref 3.5–5.1)
Sodium: 136 mmol/L (ref 135–145)

## 2021-05-15 LAB — CBC
HCT: 25.6 % — ABNORMAL LOW (ref 39.0–52.0)
Hemoglobin: 8.2 g/dL — ABNORMAL LOW (ref 13.0–17.0)
MCH: 28.4 pg (ref 26.0–34.0)
MCHC: 32 g/dL (ref 30.0–36.0)
MCV: 88.6 fL (ref 80.0–100.0)
Platelets: 252 10*3/uL (ref 150–400)
RBC: 2.89 MIL/uL — ABNORMAL LOW (ref 4.22–5.81)
RDW: 14.9 % (ref 11.5–15.5)
WBC: 11.3 10*3/uL — ABNORMAL HIGH (ref 4.0–10.5)
nRBC: 1.3 % — ABNORMAL HIGH (ref 0.0–0.2)

## 2021-05-15 MED ORDER — AMIODARONE IV BOLUS ONLY 150 MG/100ML
150.0000 mg | Freq: Once | INTRAVENOUS | Status: AC
  Administered 2021-05-15: 150 mg via INTRAVENOUS
  Filled 2021-05-15 (×2): qty 100

## 2021-05-15 MED ORDER — POTASSIUM CHLORIDE CRYS ER 20 MEQ PO TBCR
20.0000 meq | EXTENDED_RELEASE_TABLET | Freq: Once | ORAL | Status: AC
  Administered 2021-05-15: 20 meq via ORAL
  Filled 2021-05-15: qty 1

## 2021-05-15 MED ORDER — METOPROLOL SUCCINATE ER 50 MG PO TB24
50.0000 mg | ORAL_TABLET | Freq: Every day | ORAL | Status: DC
  Administered 2021-05-15 – 2021-05-17 (×3): 50 mg via ORAL
  Filled 2021-05-15 (×3): qty 1

## 2021-05-15 MED FILL — Sodium Bicarbonate IV Soln 8.4%: INTRAVENOUS | Qty: 50 | Status: AC

## 2021-05-15 MED FILL — Electrolyte-R (PH 7.4) Solution: INTRAVENOUS | Qty: 6000 | Status: AC

## 2021-05-15 MED FILL — Sodium Chloride IV Soln 0.9%: INTRAVENOUS | Qty: 3000 | Status: AC

## 2021-05-15 MED FILL — Mannitol IV Soln 20%: INTRAVENOUS | Qty: 500 | Status: AC

## 2021-05-15 NOTE — Discharge Instructions (Addendum)
Discharge Instructions:  1. You may shower, please wash incisions daily with soap and water and keep dry.  If you wish to cover wounds with dressing you may do so but please keep clean and change daily.  No tub baths or swimming until incisions have completely healed.  If your incisions become red or develop any drainage please call our office at [phone]  2. No Driving until cleared by Dr. Hendrickson's office and you are no longer using narcotic pain medications  3. Monitor your weight daily.. Please use the same scale and weigh at same time... If you gain 5-10 lbs in 48 hours with associated lower extremity swelling, please contact our office at [phone]  4. Fever of 101.5 for at least 24 hours with no source, please contact our office at [phone]  5. Activity- up as tolerated, please walk at least 3 times per day.  Avoid strenuous activity, no lifting, pushing, or pulling with your arms over 8-10 lbs for a minimum of 6 weeks  6. If any questions or concerns arise, please do not hesitate to contact our office at [phone]   Information on my medicine - ELIQUIS (apixaban)  Why was Eliquis prescribed for you? Eliquis was prescribed for you to reduce the risk of a blood clot forming that can cause a stroke if you have a medical condition called atrial fibrillation (a type of irregular heartbeat).  What do You need to know about Eliquis ? Take your Eliquis TWICE DAILY - one tablet in the morning and one tablet in the evening with or without food. If you have difficulty swallowing the tablet whole please discuss with your pharmacist how to take the medication safely.  Take Eliquis exactly as prescribed by your doctor and DO NOT stop taking Eliquis without talking to the doctor who prescribed the medication.  Stopping may increase your risk of developing a stroke.  Refill your prescription before you run out.  After discharge, you should have regular check-up appointments  with your healthcare provider that is prescribing your Eliquis.  In the future your dose may need to be changed if your kidney function or weight changes by a significant amount or as you get older.  What do you do if you miss a dose? If you miss a dose, take it as soon as you remember on the same day and resume taking twice daily.  Do not take more than one dose of ELIQUIS at the same time to make up a missed dose.  Important Safety Information A possible side effect of Eliquis is bleeding. You should call your healthcare provider right away if you experience any of the following: Bleeding from an injury or your nose that does not stop. Unusual colored urine (red or dark brown) or unusual colored stools (red or black). Unusual bruising for unknown reasons. A serious fall or if you hit your head (even if there is no bleeding).  Some medicines may interact with Eliquis and might increase your risk of bleeding or clotting while on Eliquis. To help avoid this, consult your healthcare provider or pharmacist prior to using any new prescription or non-prescription medications, including herbals, vitamins, non-steroidal anti-inflammatory drugs (NSAIDs) and supplements.  This website has more information on Eliquis (apixaban): http://www.eliquis.com/eliquis/home   

## 2021-05-15 NOTE — Progress Notes (Signed)
Mobility Specialist Progress Note   05/15/21 1730  Mobility  Activity Ambulated independently in hallway  Level of Assistance Modified independent, requires aide device or extra time  Assistive Device Front wheel walker  Distance Ambulated (ft) 470 ft  Activity Response Tolerated well  $Mobility charge 1 Mobility   Received pt in hallway having no complaints and agreeable. Pt claims ambulation would be easier w/o swelling in feet otherwise asx throughout. returned back to chair where HR spiked to 152bpm and then quickly lowered to 102bpm. RN notified about fluctuation.  During Mobility: 129 HR  Post Mobility: 152 HR   Tacey Heap Phone Number [phone]

## 2021-05-15 NOTE — TOC Benefit Eligibility Note (Signed)
Patient Teacher, English as a foreign language completed.    The patient is currently admitted and upon discharge could be taking Xarelto 20 mg.  The current 30 day co-pay is, $69.25.   The patient is currently admitted and upon discharge could be taking Eliquis 5 mg.  The current 30 day co-pay is, $85.02.   The patient is insured through [place] Patient Advocate Specialist [place] Patient Advocate Team Direct Number: [phone]  Fax: [phone]

## 2021-05-15 NOTE — Progress Notes (Signed)
Pt ambulated in hallway and having frequent runs of vtach. PA notified. Amio bolus given. Potassium replacement ordered. Labs ordered for am.

## 2021-05-15 NOTE — Progress Notes (Signed)
CARDIAC REHAB PHASE I   Went to offer to walk with pt. Pt in Afib rates 80-130s with runs of PVCs. Will let pt rest this am and return later as appropriate to ambulate.  Rufina Falco, RN BSN 05/15/2021 10:44 AM

## 2021-05-15 NOTE — Progress Notes (Addendum)
° °   °  [address]       [place] [address]             [phone]        5 Days Post-Op Procedure(s) (LRB): MINIMALLY INVASIVE EXCISION OF ATRIAL MYXOMA (Right) PARTIAL CRYOMAZE PROCEDURE (N/A) TRANSESOPHAGEAL ECHOCARDIOGRAM (TEE) MEDIAN STERNOTOMY (N/A) APPLICATION OF CELL SAVER (N/A)  Subjective: Patient sitting in chair. Wife and daughter in room. Back of right hand hurts and is swollen this am.  Objective: Vital signs in last 24 hours: Temp:  [98.3 F (36.8 C)-98.8 F (37.1 C)] 98.3 F (36.8 C) (02/06 0631) Pulse Rate:  [64-88] 70 (02/06 0631) Cardiac Rhythm: Other (Comment) (02/05 2006) Resp:  [13-21] 18 (02/05 1627) BP: (104-128)/(63-76) 128/76 (02/06 0631) SpO2:  [90 %-99 %] 92 % (02/06 0631) Weight:  [118.8 kg] 118.8 kg (02/06 0627)  Pre op weight 117.9 kg Current Weight  05/15/21 118.8 kg       Intake/Output from previous day: 02/05 0701 - 02/06 0700 In: 356.7 [P.O.:240; I.V.:116.7] Out: -    Physical Exam:  Cardiovascular: IRRR IRRR Pulmonary: Slightly diminished bibasilar breath sounds Abdomen: Soft, non tender, bowel sounds present. Extremities: Mild bilateral lower extremity edema. Right hand with swelling;scant erythema but is painful. Likely phlebitis secondary to infiltrated IV. Doppler Dp on right and right groin with ecchymosis. Wounds: Sternal right anterior, and right groin wounds are clean and dry.  No erythema or signs of infection.  Lab Results: CBC: Recent Labs    05/13/21 0314  WBC 17.3*  HGB 8.8*  HCT 26.6*  PLT 137*   BMET:  Recent Labs    05/13/21 0314  NA 136  K 4.4  CL 103  CO2 24  GLUCOSE 159*  BUN 20  CREATININE 1.18  CALCIUM 8.7*    PT/INR:  Lab Results  Component Value Date   INR 1.4 (H) 05/10/2021   INR 0.9 05/08/2021   ABG:  INR: Will add last result for INR, ABG once components are confirmed Will add last 4 CBG results once components are confirmed  Assessment/Plan:  1. CV  - History of PAF. S/p pulmonary vein isolation. During my exam, he was in SR with HR in the 70's then back into a fib with RVR with HR into the 120's. Will give Amiodarone IV bolus this am as a fib with RVR. On Amiodarone 400 mg bid, Lopressor 25 mg bid, Apixaban 5 mg bid. Will discuss management of PAF with Dr. Zollie Scale need cardiology to evaluate. 2.  Pulmonary - On room air. Encourage incentive spirometer. 3. Volume Overload - Continue Lasix 40 mg orally daily 4.  Expected post op acute blood loss anemia - H and H 02/04 stable at 8.8 and 26.6. Await this am's result 5. Mild thrombocytopenia-platelets 02/04 up to 137,000 6. CBG 98/93/96. On scheduled Insulin. Pre op HGA1C 6.3. Stop accu checks and SS PRN 7. Regarding mild phlebitis, right hand swelling, will monitor need for antibiotic.  Donielle M ZimmermanPA-C 05/15/2021,6:57 AM   Patient seen and examined, agree with above In and out of A fib- on amiodarone and metoprolol Anticoagulated with Eliquis Home when rhythm stable CBGs improved- dc levemir and monitor  Kinzie Wickes C. Roxan Hockey, MD [facility] [phone]

## 2021-05-15 NOTE — Progress Notes (Signed)
CARDIAC REHAB PHASE I   PRE:  Rate/Rhythm: 70s-130s SR-Afib    SaO2: 97 RA  MODE:  Ambulation: 470 ft   POST:  Rate/Rhythm: 80s-140s SR-Afib  BP:  Sitting: 125/74     SaO2: 95 RA   Pt eager to ambulate. Pt ambulated 473ft in hallway standby assist with front wheel walker. Pt denies CP, SOB, dizziness, or palpitations. Pt returned to bed after sitting up most of they day. Demonstrating ~2000 on IS. Encouraged continued ambulation, IS use, and sternal precautions. Pt requesting walker and 3-in-1 for home use, CM aware. Will continue to follow.  [id number] Rufina Falco, RN BSN 05/15/2021 1:54 PM

## 2021-05-16 DIAGNOSIS — I5189 Other ill-defined heart diseases: Secondary | ICD-10-CM

## 2021-05-16 DIAGNOSIS — I480 Paroxysmal atrial fibrillation: Secondary | ICD-10-CM

## 2021-05-16 LAB — MAGNESIUM: Magnesium: 2.4 mg/dL (ref 1.7–2.4)

## 2021-05-16 LAB — BASIC METABOLIC PANEL
Anion gap: 12 (ref 5–15)
BUN: 24 mg/dL — ABNORMAL HIGH (ref 6–20)
CO2: 27 mmol/L (ref 22–32)
Calcium: 8.9 mg/dL (ref 8.9–10.3)
Chloride: 100 mmol/L (ref 98–111)
Creatinine, Ser: 1.07 mg/dL (ref 0.61–1.24)
GFR, Estimated: >60 mL/min (ref >60)
Glucose, Bld: 98 mg/dL (ref 70–99)
Potassium: 4 mmol/L (ref 3.5–5.1)
Sodium: 139 mmol/L (ref 135–145)

## 2021-05-16 MED ORDER — AMIODARONE IV BOLUS ONLY 150 MG/100ML
150.0000 mg | Freq: Once | INTRAVENOUS | Status: AC
  Administered 2021-05-16: 150 mg via INTRAVENOUS
  Filled 2021-05-16: qty 100

## 2021-05-16 MED ORDER — ACETAMINOPHEN 325 MG PO TABS
650.0000 mg | ORAL_TABLET | Freq: Four times a day (QID) | ORAL | Status: DC | PRN
  Administered 2021-05-16 – 2021-05-21 (×13): 650 mg via ORAL
  Filled 2021-05-16 (×14): qty 2

## 2021-05-16 MED ORDER — FUROSEMIDE 10 MG/ML IJ SOLN
40.0000 mg | Freq: Every day | INTRAMUSCULAR | Status: AC
  Administered 2021-05-17 – 2021-05-18 (×2): 40 mg via INTRAVENOUS
  Filled 2021-05-16 (×2): qty 4

## 2021-05-16 MED FILL — Heparin Sodium (Porcine) Inj 1000 Unit/ML: Qty: 1000 | Status: AC

## 2021-05-16 MED FILL — Potassium Chloride Inj 2 mEq/ML: INTRAVENOUS | Qty: 40 | Status: AC

## 2021-05-16 MED FILL — Magnesium Sulfate Inj 50%: INTRAMUSCULAR | Qty: 10 | Status: AC

## 2021-05-16 NOTE — Progress Notes (Addendum)
° °   °  [address]       [place] [address]             [phone]        6 Days Post-Op Procedure(s) (LRB): MINIMALLY INVASIVE EXCISION OF ATRIAL MYXOMA (Right) PARTIAL CRYOMAZE PROCEDURE (N/A) TRANSESOPHAGEAL ECHOCARDIOGRAM (TEE) MEDIAN STERNOTOMY (N/A) APPLICATION OF CELL SAVER (N/A)  Subjective: Patient states he put oxygen back on as "machine" was constantly beeping". He is a little frustrated with a fib  Objective: Vital signs in last 24 hours: Temp:  [97.8 F (36.6 C)-99.5 F (37.5 C)] 98.3 F (36.8 C) (02/07 0407) Pulse Rate:  [68-139] 72 (02/07 0407) Cardiac Rhythm: Ventricular tachycardia;Atrial fibrillation (02/07 0200) Resp:  [16-17] 17 (02/07 0407) BP: (100-127)/(71-94) 100/77 (02/07 0407) SpO2:  [92 %-98 %] 98 % (02/07 0407) Weight:  [120.5 kg] 120.5 kg (02/07 0407)  Pre op weight 117.9 kg Current Weight  05/16/21 120.5 kg       Intake/Output from previous day: 02/06 0701 - 02/07 0700 In: 483 [P.O.:480; I.V.:3] Out: 0    Physical Exam:  Cardiovascular: IRRR IRRR Pulmonary: Slightly diminished bibasilar breath sounds Abdomen: Soft, non tender, bowel sounds present. Extremities: Mild bilateral lower extremity edema. Right hand with swelling;scant erythema but is painful.Mild phlebitis secondary to infiltrated IV.  Wounds: Sternal right anterior, and right groin wounds are clean and dry.  No erythema or signs of infection.  Lab Results: CBC: Recent Labs    05/15/21 0721  WBC 11.3*  HGB 8.2*  HCT 25.6*  PLT 252    BMET:  Recent Labs    05/15/21 0721 05/16/21 0120  NA 136 139  K 3.8 4.0  CL 99 100  CO2 26 27  GLUCOSE 135* 98  BUN 21* 24*  CREATININE 1.13 1.07  CALCIUM 8.6* 8.9     PT/INR:  Lab Results  Component Value Date   INR 1.4 (H) 05/10/2021   INR 0.9 05/08/2021   ABG:  INR: Will add last result for INR, ABG once components are confirmed Will add last 4 CBG results once components are  confirmed  Assessment/Plan:  1. CV - History of PAF. S/p pulmonary vein isolation. Patient seems to have more persistent a fib with RVR (rates as high as 140 this am). Will give Amiodarone IV bolus this am.  On Amiodarone 400 mg bid,Toprol XL 50 mg daily,Apixaban 5 mg bid. Will ask cardiology to evaluate 2.  Pulmonary - On room air. Encourage incentive spirometer. 3. Volume Overload - Continue Lasix 40 mg orally daily 4.  Expected post op acute blood loss anemia - H and H yesterday 8.2 and 25.6  5. Mild thrombocytopenia resolved-last platelets up to 252,000   Ramey Schiff M ZimmermanPA-C 05/16/2021,6:56 AM

## 2021-05-16 NOTE — Op Note (Signed)
Shawn Suarez, Shawn Suarez MEDICAL RECORD NO: [id number] ACCOUNT NO: [id number] DATE OF BIRTH: 03-15-1966 FACILITY: [facility] LOCATION: [facility] PHYSICIAN: Revonda Standard. 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  PROCEDURE PERFORMED:   Median sternotomy,  Excision of left atrial myxoma,  Bovine pericardial patch repair of interatrial septum,  Pulmonary vein isolation using cryoablation,  Repair of right common femoral artery with Hemashield patch.  SURGEON:  Revonda Standard. Roxan Hockey, MD  ASSISTANT:  Lars Pinks, PA  ANESTHESIA:  General.  FINDINGS:  Transesophageal echocardiography showed a large left atrial myxoma arising from the interatrial septum. Femoral artery injury with attempted percutaneous cannulation, relatively small peripheral arteries.  Decision was made to complete the  procedure using median sternotomy. Large left atrial myxoma arising from atrial wall at the inferior aspect of fossa ovalis.  CLINICAL NOTE: Shawn Suarez is a 56 year old gentleman with a history of paroxysmal atrial fibrillation, who presented with palpitations and shortness of breath.  Workup showed normal left ventricular function, but there was a left atrial myxoma that was  confirmed with a CT for coronary calcium scoring and also cardiac MR.  His CT showed no evidence of significant coronary artery disease.  He was advised to undergo resection of the left atrial myxoma and pulmonary vein isolation due to his history of  paroxysmal atrial fibrillation.  The patient desired to have procedure done via a right thoracotomy approach, but understood there was a possibility of conversion to median sternotomy depending on intraoperative findings.  Indications, risks, benefits and alternatives were discussed in detail with the patient.  He  understood and accepted the risks and agreed to proceed.  OPERATIVE NOTE: Shawn Suarez was brought to the preoperative holding area on 05/10/2021.  Anesthesia placed a Swan-Ganz catheter and an arterial blood pressure monitoring line.  He was taken to the operating room, anesthetized and intubated.  A Foley  catheter was placed.  Intravenous antibiotics were administered.  Transesophageal echocardiography was performed by Dr. Ola Spurr.  Please see his separately dictated note for full details of the procedure.  The left atrial myxoma was present.  There was preserved left  ventricular function.  The chest, abdomen and legs were prepped and draped in the usual sterile fashion.  A timeout was performed.  An incision was made in approximately the fourth interspace to the right of the sternum.  It was carried through the skin and subcutaneous tissue.  The pectoralis muscles were separated.  Intercostal muscles were divided.  There  was a large fat pad noted on the pericardium.  It was felt reasonable to attempt a minimally invasive approach.  Attention then was turned to the groin and the right femoral artery was accessed percutaneously using a Seldinger technique. Two Perclose  devices were deployed.  The tract over the wire was dilated.  When the 16-French dilator was advanced, there was bleeding.  This was controlled with pressure, but there was concern for significant arterial injury. An incision was made in the  groin and dissecting down, there was a tear in the artery from the dilator posteriorly and just above the insertion site. Proximal and distal control were obtained.  There was a linear laceration. To avoid narrowing the artery, a Hemashield patch was  used and this was sewn in place with a running 5-0 Prolene suture.  The actual puncture site was oversewn with  a 6-0 Prolene suture.  There was a good pulse distal to the repair and there was good hemostasis.  Inspection of the common femoral artery   revealed it to be relatively small and it was not thought that an adequate arterial cannula could be safely placed.  Decision was made to proceed with a median sternotomy and aortic cannulation.  The groin wound was packed.  The median sternotomy was  performed.  A sternal retractor was placed and opened over time.  The patient was fully heparinized.  After confirming adequate anticoagulation with ACT measurement, the aorta was cannulated via concentric 2-0 Ethibond pledgeted pursestring sutures.  A  dual stage venous cannula was placed via a pursestring suture in right atrial appendage.  After confirming adequate anticoagulation with ACT measurement, the aorta was cannulated via concentric 2-0 Ethibond pledgeted pursestring sutures.  A 28-French  malleable venous cannula was placed via pursestring suture in the right atrial appendage and directed into the superior vena cava.  Cardiopulmonary bypass was initiated.  A 36-French malleable cannula was placed via a pursestring suture in the inferior  aspect of the right atrium and directed into the inferior vena cava.  Caval tapes were placed, but were only tightened during the open portion of the procedure.  A retrograde cardioplegia cannula was placed via a pursestring suture in the right atrium  and directed into the coronary sinus.  An antegrade cardioplegia cannula was placed in the ascending aorta.  A temperature probe was placed in the myocardial septum.  A foam pad was placed in the pericardium to insulate the heart.  Carbon dioxide was insufflated into the operative field per protocol.  The aorta was cross clamped.  The left ventricle was emptied via the aortic root vent.  Cardiac arrest then was achieved with a combination of cold antegrade and retrograde KBC cardioplegia.  An initial dose of cardioplegia was given antegrade until  there was a diastolic arrest and the remainder was administered retrograde.  Additional cardioplegia was administered at  1 hour.  The caval tapes were tightened.  A right atriotomy was performed.  The septum was opened through the fossa ovalis and the myxoma was noted to be arising from the atrial wall along the inferior aspect of the fossa ovalis and extending onto the muscular septum just below that.  The myxoma was approximately 2.5 cm.  The base of the myxoma was completely excised to a clear gross margin.the mass was removed and sent for permanent pathology.   A bovine pericardial patch was prepared and the septum was repaired with a bovine pericardial patch using a running 5-0 Prolene suture.  A left atriotomy was performed incising the left atrium proximal to the insertion of the right-sided pulmonary veins.  Pulmonary vein isolation then was completed, creating a box lesion using cryoablation, taking the probe to minus 70 degrees Celsius for 2 minutes at each location.  The left atriotomy then was closed in two layers with running 4-0 Prolene sutures.  The first was a horizontal mattress suture.  At the completion of the first layer, de-airing maneuvers were performed.  The second layer was a running simple suture.  The patient was placed in Trendelenburg position.  A reanimation dose of cardioplegia was administered retrograde and closure of the right atriotomy was initiated.  This was also closed with 2 layers of running 4-0 Prolene sutures and de-airing was again performed after completion of the first layer.  After completion of the reanimation dose of cardioplegia, the  aortic crossclamp was removed.  The total crossclamp time was 69 minutes.  While rewarming was completed, the suture lines were inspected for hemostasis.  Epicardial pacing wires were placed on the right ventricle and right atrium.  The superior vena cava cannula was repositioned into the right atrium.  The inferior vena cava cannula was removed.  When the patient had rewarmed to a core temperature of 37 degrees Celsius, he was weaned from   cardiopulmonary bypass on the first attempt.  The total bypass time was 116 minutes.  Post-bypass transesophageal echocardiography showed preserved left ventricular function.  There was no shunt across the atrial septum.  A test dose of protamine was  administered and was well tolerated.  The atrial and aortic cannulae were removed, as was the retrograde cardioplegia cannula.  There was no air visualized with the echocardiogram, so the cardioplegia cannula/ vent was removed from the ascending aorta.  The patient remained hemodynamically stable throughout the post-bypass period.  The remainder of  the protamine was administered without incident.  The chest was copiously irrigated with warm saline.  Hemostasis was achieved.  Right pleural and mediastinal chest tubes were placed through separate subcostal incisions.  The pericardium was  reapproximated with interrupted 3-0 silk sutures.  It came together easily without tension.  The sternum was closed with a combination of single and double heavy gauge stainless steel wires.  Pectoralis fascia, subcutaneous tissue and skin were closed in  standard fashion.  The right mini thoracotomy incision was copiously irrigated with saline and that wound was also closed in standard fashion and finally the right groin incision was copiously irrigated and closed in standard fashion as well. Pulse  distal to the repair was again confirmed prior to closing the groin incision.  All sponge, needle and instrument counts were correct at the end of the procedure. The patient was taken from the operating room to the surgical intensive care unit, intubated  and in good condition.  Experienced assistance was necessary for this case due to complexity. Lars Pinks performed that role providing exposure, retraction of delicate tissues, suctioning and suture management.      PAA D: 05/15/2021 5:29:54 pm T: 05/16/2021 12:37:00 am  JOB: [id number]/ [id number]

## 2021-05-16 NOTE — Progress Notes (Signed)
CARDIAC REHAB PHASE I   PRE:  Rate/Rhythm: 70-90   BP:  Sitting: 108/71      SaO2: 96 RA  MODE:  Ambulation: 470 ft   POST:  Rate/Rhythm: 70s-130s SR/PVCs/Afib  BP:  Sitting: 113/72    SaO2: 97 RA  Pt agreeable to ambulate. Pt ambulated 443ft in hallway standby assist with front wheel walker. Pt denies CP, SOB, dizziness, or palpitations. Pt returned to recliner. Encouraged continued IS use and ambulation. DME at bedside. Will continue to follow.  [phone] Rufina Falco, RN BSN 05/16/2021 11:20 AM

## 2021-05-16 NOTE — Progress Notes (Signed)
Patient had 8 bts Vtach per call from [place]. Patient has persistent a-fib. Heart rate varying widely between 70-130. Patient sitting in chair awake and asymptomatic. Observed patient's heart rate while I was in the room and it ran from 72 to 120 while I was in the room. Does not sustain elevated rates, frequently dips up and down.

## 2021-05-16 NOTE — Progress Notes (Signed)
Mobility Specialist Criteria Algorithm Info.   05/16/21 1437  Mobility  Activity Ambulated independently in hallway  Range of Motion/Exercises Active;All extremities  Level of Assistance Modified independent, requires aide device or extra time  Assistive Device Front wheel walker  Distance Ambulated (ft) 1200 ft  Activity Response Tolerated well   Patient received in recliner eager to participate. Ambulated independently with steady gait. Returned to room without complaint or incident. Was left in recliner chair with all needs met, call bell in reach.  05/16/2021 3:23 PM  [place] Prosperity Darrough, [place], [place]  LYHTM:[phone] Office: [phone]

## 2021-05-16 NOTE — Consult Note (Addendum)
Cardiology Consultation:   Patient ID: Shawn Suarez MRN: [id number]; DOB: 1966/04/01  Admit date: 05/10/2021 Date of Consult: 05/16/2021  PCP:  Antony Contras, MD   [facility] Providers Cardiologist:  [name], MD      Patient Profile:   Shawn Suarez is a 56 y.o. male with a hx of Paroxsymal Afib, PVCs, GERD and atrial myxoma who is being seen 05/16/2021 for the evaluation of atrial fibrillation at the request of Dr. Roxan Hockey.  History of Present Illness:   [name] is a 56 yo male with PMH noted above. He was referred to Dr. Marlou Porch as an outpatient for palpitations and dyspnea on exertion on 02/21/2021.  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 Has worked for the [employer] for many years as primary job.   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  Reported increased exertional fatigue and palpitations at his office visit and underwent outpatient cardiac CT which showed minimal CAD but 25 to 30 mm possible myxoma in the left atrial septum.  Also wore outpatient ZIO monitor which showed 10 minutes of atrial fibrillation, PVCs, bigeminy, triplets and brief runs of nonsustained VT.  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 Was referred to [facility] for management of myxoma resection, maze and left atrial clipping.  He was seen by Dr. Roxan Hockey and scheduled for outpatient procedure on 2/1.  Underwent successful excision of atrial myxoma as well as pulmonary vein isolation using cryotherapy. Post op developed atrial fibrillation and placed on IV amiodarone and then transitioned for 400mg  BID. Initially converted to SR but has intermittent break through episodes of Afib. Now appears Afib is persistent despite oral amiodarone and several IV bolus doses. He has been started on Eliquis 5mg  BID via [facility].  In review of telemetry he continues to have intermittent bursts of Afib RVR with PVCs, and short runs of NSVT.  Currently in SR. Says he does feel the episodes when his HR is fast but overall not very symptomatic.    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   ZIO monitor 2022-symptomatic   GERD (gastroesophageal reflux disease)    Paroxysmal atrial fibrillation ([place]) 04/19/2021   ZIO monitor-2022- approximately 10 minutes atrial fibrillation rapid ventricular response    Past Surgical History:  Procedure Laterality Date   CHOLECYSTECTOMY     MEDIASTERNOTOMY N/A 05/10/2021   Procedure: MEDIAN STERNOTOMY;  Surgeon: Melrose Nakayama, MD;  Location: [place];  Service: Open Heart Surgery;  Laterality: N/A;   MINIMALLY INVASIVE EXCISION OF ATRIAL MYXOMA Right 05/10/2021   Procedure: MINIMALLY INVASIVE EXCISION OF ATRIAL MYXOMA;  Surgeon: Melrose Nakayama, MD;  Location: [place];  Service: Open Heart Surgery;  Laterality: Right;   MINIMALLY INVASIVE MAZE PROCEDURE N/A 05/10/2021   Procedure: PARTIAL CRYOMAZE PROCEDURE;  Surgeon: Melrose Nakayama, MD;  Location: [place];  Service: Open Heart Surgery;  Laterality: N/A;   TEE WITHOUT CARDIOVERSION  05/10/2021   Procedure: TRANSESOPHAGEAL ECHOCARDIOGRAM (TEE);  Surgeon: Melrose Nakayama, MD;  Location: [facility] OR;  Service: Open Heart Surgery;;     Home Medications:  Prior to Admission medications   Medication Sig Start Date End Date Taking? Authorizing Provider  aspirin EC 81 MG tablet Take 1 tablet (81 mg total) by mouth daily. Swallow whole. 03/21/21  Yes Jerline Pain, MD  metoprolol succinate (TOPROL XL) 25 MG 24 hr tablet Take 1 tablet (25 mg total) by mouth daily. 03/10/21  Yes Jerline Pain, MD   mometasone-formoterol (DULERA) 100-5 MCG/ACT AERO Inhale 2 puffs into the lungs 2 (two) times daily as needed (Asthma like symptoms).   Yes [provider]  pantoprazole (PROTONIX) 20 MG tablet Take 20 mg by mouth daily. 02/02/21  Yes [provider]    Inpatient Medications: Scheduled Meds:  amiodarone  400 mg Oral BID   apixaban  5 mg Oral BID   aspirin EC  81 mg Oral Daily   bisacodyl  10 mg Oral Daily   Or   bisacodyl  10 mg Rectal Daily   furosemide  40 mg Oral Daily   mouth rinse  15 mL Mouth Rinse BID   metoprolol succinate  50 mg Oral Daily   pantoprazole  40 mg Oral Daily   potassium chloride  20 mEq Oral Daily   sodium chloride flush  3 mL Intravenous Q12H   sodium chloride flush  3 mL Intravenous Q12H   Continuous Infusions:  sodium chloride     sodium chloride     PRN Meds: sodium chloride, sodium chloride, acetaminophen, alum & mag hydroxide-simeth, lactulose, magnesium hydroxide, metoprolol tartrate, mometasone-formoterol, ondansetron (ZOFRAN) IV, oxyCODONE, sodium chloride flush, sodium chloride flush, traMADol, zolpidem  Allergies:   No Known Allergies  Social History:   Social History   Socioeconomic History   Marital status: Married    Spouse name: Not on file   Number of children: Not on file   Years of education: Not on file   Highest education level: Not on file  Occupational History   Not on file  Tobacco Use   Smoking status: Never   Smokeless tobacco: Current   Tobacco comments:    1 can per 1.5 weeks  Vaping Use   Vaping Use: Never used  Substance and Sexual Activity   Alcohol use: Yes    Comment: rare   Drug use: Never   Sexual activity: Not on file  Other Topics Concern   Not on file  Social History Narrative   Not on file   Social Determinants of Health   Financial Resource Strain: Not on file  Food Insecurity: Not on file  Transportation Needs: Not on file  Physical Activity: Not on file  Stress: Not on  file  Social Connections: Not on file  Intimate Partner Violence: Not on file    Family History:    Family History  Problem Relation Age of Onset   Hypertension Mother    Diabetes Father    Heart attack Neg Hx    Hyperlipidemia Neg Hx    Sudden death Neg Hx      ROS:  Please see the history of present illness.   All other ROS reviewed and negative.     Physical Exam/Data:   Vitals:   05/15/21 2311 05/16/21 0407 05/16/21 0807 05/16/21 1114  BP: 115/82 100/77 106/66 113/72  Pulse: 71 72 90 71  Resp: 16 17 16 16   Temp: 98.4 F (36.9 C) 98.3 F (36.8 C) 98.7 F (37.1 C) (!) 97.5 F (36.4 C)  TempSrc: Oral Oral Oral Oral  SpO2: 96% 98% 94% 94%  Weight:  120.5 kg    Height:        Intake/Output Summary (Last 24 hours) at 05/16/2021 1333 Last data filed at 05/16/2021 1203 Gross per 24 hour  Intake 240 ml  Output 150 ml  Net 90 ml   Last 3 Weights 05/16/2021 05/15/2021 05/14/2021  Weight (lbs) 265 lb 11.2 oz 261 lb 14.4 oz 268 lb 4.8 oz  Weight (kg) 120.521 kg 118.797 kg 121.7 kg     Body mass index is 39.24 kg/m.  General:  Well nourished, well developed, in no acute distress HEENT: normal Neck: no JVD Vascular: No carotid bruits; Distal pulses 2+ bilaterally Cardiac:  normal S1, S2; RRR; no murmur  Lungs:  clear to auscultation bilaterally, no wheezing, rhonchi or rales  Abd: soft, nontender, no hepatomegaly  Ext: 1-2+ bilateral LE edema, upper extremities edema as well Musculoskeletal:  No deformities, BUE and BLE strength normal and equal Skin: warm and dry  Neuro:  CNs 2-12 intact, no focal abnormalities noted Psych:  Normal affect   EKG:  The EKG was personally reviewed and demonstrates:  N/a  Telemetry:  Telemetry was personally reviewed and demonstrates:  Sinus rhythm with freq episodes of Afib RVR, PVCs, PACs and NSVT   Relevant CV Studies:  Echo: 03/10/2021  IMPRESSIONS     1. Left ventricular ejection fraction, by estimation, is 60 to 65%. The  left  ventricle has normal function. The left ventricle has no regional  wall motion abnormalities. There is mild left ventricular hypertrophy.  Left ventricular diastolic parameters  were normal.   2. Right ventricular systolic function is normal. The right ventricular  size is normal. Tricuspid regurgitation signal is inadequate for assessing  PA pressure.   3. The mitral valve is normal in structure. Trivial mitral valve  regurgitation. No evidence of mitral stenosis.   4. The aortic valve is grossly normal. Aortic valve regurgitation is not  visualized. No aortic stenosis is present.   5. The inferior vena cava is normal in size with greater than 50%  respiratory variability, suggesting right atrial pressure of 3 mmHg.   FINDINGS   Left Ventricle: Left ventricular ejection fraction, by estimation, is 60  to 65%. The left ventricle has normal function. The left ventricle has no  regional wall motion abnormalities. Definity contrast agent was given IV  to delineate the left ventricular   endocardial borders. The left ventricular internal cavity size was normal  in size. There is mild left ventricular hypertrophy. Left ventricular  diastolic parameters were normal.   Right Ventricle: The right ventricular size is normal. No increase in  right ventricular wall thickness. Right ventricular systolic function is  normal. Tricuspid regurgitation signal is inadequate for assessing PA  pressure.   Left Atrium: Left atrial size was normal in size.   Right Atrium: Right atrial size was normal in size.   Pericardium: There is no evidence of pericardial effusion.   Mitral Valve: The mitral valve is normal in structure. Trivial mitral  valve regurgitation. No evidence of mitral valve stenosis.   Tricuspid Valve: The tricuspid valve is normal in structure. Tricuspid  valve regurgitation is not demonstrated. No evidence of tricuspid  stenosis.   Aortic Valve: The aortic valve is grossly normal.  Aortic valve  regurgitation is not visualized. No aortic stenosis is present.   Pulmonic Valve: The pulmonic valve was normal in structure. Pulmonic valve  regurgitation is not visualized. No evidence of pulmonic stenosis.   Aorta: The aortic root  is normal in size and structure.   Venous: The inferior vena cava is normal in size with greater than 50%  respiratory variability, suggesting right atrial pressure of 3 mmHg.   IAS/Shunts: No atrial level shunt detected by color flow Doppler.   Laboratory Data:  High Sensitivity Troponin:  No results for input(s): TROPONINIHS in the last 720 hours.   Chemistry Recent Labs  Lab 05/11/21 0412 05/11/21 1615 05/12/21 0447 05/13/21 0314 05/15/21 0721 05/16/21 0120  NA 138 136   < > 136 136 139  K 4.3 4.0   < > 4.4 3.8 4.0  CL 110 106   < > 103 99 100  CO2 22 22   < > 24 26 27   GLUCOSE 139* 191*   < > 159* 135* 98  BUN 13 16   < > 20 21* 24*  CREATININE 1.19 1.28*   < > 1.18 1.13 1.07  CALCIUM 8.1* 7.9*   < > 8.7* 8.6* 8.9  MG 2.6* 2.5*  --   --   --  2.4  GFRNONAA >60 >60   < > >60 >60 >60  ANIONGAP 6 8   < > 9 11 12    < > = values in this interval not displayed.    No results for input(s): PROT, ALBUMIN, AST, ALT, ALKPHOS, BILITOT in the last 168 hours. Lipids No results for input(s): CHOL, TRIG, HDL, LABVLDL, LDLCALC, CHOLHDL in the last 168 hours.  Hematology Recent Labs  Lab 05/12/21 0447 05/13/21 0314 05/15/21 0721  WBC 16.9* 17.3* 11.3*  RBC 3.04* 3.06* 2.89*  HGB 9.0* 8.8* 8.2*  HCT 26.6* 26.6* 25.6*  MCV 87.5 86.9 88.6  MCH 29.6 28.8 28.4  MCHC 33.8 33.1 32.0  RDW 14.0 14.4 14.9  PLT 110* 137* 252   Thyroid No results for input(s): TSH, FREET4 in the last 168 hours.  BNPNo results for input(s): BNP, PROBNP in the last 168 hours.  DDimer No results for input(s): DDIMER in the last 168 hours.   Radiology/Studies:  DG Chest 2 View  Result Date: 05/13/2021 CLINICAL DATA:  Encounter for atrial myxoma. EXAM:  CHEST - 2 VIEW COMPARISON:  Chest radiograph dated May 12, 2021 FINDINGS: The heart is markedly enlarged. Low lung volumes. Mild pulmonary vascular congestion. No large pleural effusion or pneumothorax. Interval removal of the right IJ access central line sheath. No acute osseous abnormality. Sternotomy wires are intact. IMPRESSION: Stable cardiomegaly with mild pulmonary vascular congestion. Low lung volumes. No large pleural effusion or pneumothorax. Electronically Signed   By: Keane Police D.O.   On: 05/13/2021 08:14     Assessment and Plan:   Shawn Suarez is a 56 y.o. male with a hx of Paroxsymal Afib, PVCs, GERD and atrial myxoma who is being seen 05/16/2021 for the evaluation of atrial fibrillation at the request of Dr. Roxan Hockey.  Atrial fibrillation: Does have hx noted on Zio patch as an outpatient prior to surgery. Continues to have intermittent episodes of atrial fib with RVR. He has been on IV amiodarone and transitioned to 400mg  BID with several 150mg  IV boluses. Rhythm is difficult as he is flipping in and out of afib. He is also significantly volume overloaded on exam which is likely contributing.  -- would continue with amiodarone 400mg  BID, along with Toprol 50mg  daily -- on Eliquis 5mg  BID  Post op volume overload: 2+ LE edema along with swelling in arms. Remains net + 2.5L, suspect this is contributing to ongoing issues with  atrial fibrillation as well -- would continue to diuresis, may benefit from several doses of IV lasix. Already received lasix today, will switch to IV lasix for tomorrow.   S/p resection of atrial myxoma: done by Dr. Roxan Hockey on 2/1   Risk Assessment/Risk Scores:   CHA2DS2-VASc Score = 1  This indicates a 0.6% annual risk of stroke. The patient's score is based upon: CHF History: 0 HTN History: 0 Diabetes History: 0 Stroke History: 0 Vascular Disease History: 1 Age Score: 0 Gender Score: 0       For questions or updates, please contact  [place] Please consult [email or web address] for contact info under    Signed, Reino Bellis, NP  05/16/2021 1:33 PM  I have examined the patient and reviewed assessment and plan and discussed with patient.  Agree with above as stated.     Currently, rhythm stable.  Continue amiodarone.  He does appear quite volume overloaded.  This may be causing some atrial stretch and exacerbating arrhythmias.  Would consider IV diuresis for at least a couple of doses if his renal function and blood pressure tolerate.  Hopefully, arrhythmias will get less frequent as time from surgery increases.  Larae Grooms

## 2021-05-17 DIAGNOSIS — E8770 Fluid overload, unspecified: Secondary | ICD-10-CM

## 2021-05-17 MED ORDER — CEPHALEXIN 500 MG PO CAPS
500.0000 mg | ORAL_CAPSULE | Freq: Three times a day (TID) | ORAL | Status: DC
  Administered 2021-05-17 – 2021-05-22 (×16): 500 mg via ORAL
  Filled 2021-05-17 (×16): qty 1

## 2021-05-17 MED ORDER — METOPROLOL SUCCINATE ER 50 MG PO TB24
75.0000 mg | ORAL_TABLET | Freq: Every day | ORAL | Status: DC
  Filled 2021-05-17: qty 1

## 2021-05-17 NOTE — Progress Notes (Addendum)
° °   °  [address]       [place] [address]             [phone]        7 Days Post-Op Procedure(s) (LRB): MINIMALLY INVASIVE EXCISION OF ATRIAL MYXOMA (Right) PARTIAL CRYOMAZE PROCEDURE (N/A) TRANSESOPHAGEAL ECHOCARDIOGRAM (TEE) MEDIAN STERNOTOMY (N/A) APPLICATION OF CELL SAVER (N/A)  Subjective: Patient just walked with his wife. He was glad he did not go back into a fib with RVR.  Objective: Vital signs in last 24 hours: Temp:  [97.5 F (36.4 C)-98.8 F (37.1 C)] 98.1 F (36.7 C) (02/08 0634) Pulse Rate:  [68-90] 75 (02/08 0634) Cardiac Rhythm: Atrial fibrillation (02/08 0508) Resp:  [16-18] 18 (02/08 0634) BP: (100-132)/(59-77) 112/67 (02/08 0634) SpO2:  [94 %-97 %] 97 % (02/08 0634) Weight:  [119.9 kg] 119.9 kg (02/08 0333)  Pre op weight 117.9 kg Current Weight  05/17/21 119.9 kg       Intake/Output from previous day: 02/07 0701 - 02/08 0700 In: 240 [P.O.:240] Out: 150 [Urine:150]   Physical Exam:  Cardiovascular: RRR Pulmonary: Slightly diminished bibasilar breath sounds Abdomen: Soft, non tender, bowel sounds present. Extremities: Mild bilateral lower extremity edema. Right hand with swelling;scant erythema but is painful.Mild phlebitis secondary to infiltrated IV. Doppler DP right foot Wounds: Sternal right anterior, and right groin wounds are clean and dry.  No erythema or signs of infection.  Lab Results: CBC: Recent Labs    05/15/21 0721  WBC 11.3*  HGB 8.2*  HCT 25.6*  PLT 252    BMET:  Recent Labs    05/15/21 0721 05/16/21 0120  NA 136 139  K 3.8 4.0  CL 99 100  CO2 26 27  GLUCOSE 135* 98  BUN 21* 24*  CREATININE 1.13 1.07  CALCIUM 8.6* 8.9     PT/INR:  Lab Results  Component Value Date   INR 1.4 (H) 05/10/2021   INR 0.9 05/08/2021   ABG:  INR: Will add last result for INR, ABG once components are confirmed Will add last 4 CBG results once components are confirmed  Assessment/Plan:  1. CV -  History of PAF. S/p pulmonary vein isolation. Had been SR until around 5 am when he went into a fib with HRs sustained in the 120's. He was given Lopressor 2.5 mg IV and is now in SR again. On Amiodarone 400 mg bid,Toprol XL 50 mg daily,Apixaban 5 mg bid. Cardiology evaluated and agreed with current medications and recommended further diuresing. 2.  Pulmonary - On room air. Encourage incentive spirometer. 3. Volume Overload - Continue Lasix 40 mg IV daily 4.  Expected post op acute blood loss anemia - H and H yesterday 8.2 and 25.6  5. Mild thrombocytopenia resolved-last platelets up to 252,000 6. Once a fib rate better controlled, will discharge  Donielle M ZimmermanPA-C 05/17/2021,6:59 AM   Patient seen and examined, agree with above Decreased swelling but increased erythema right hand- start PO Keflex Still in and out of a fib. Rate better controlled but still up to 120- increase Toprol to 75 mg daily  Remo Lipps C. Roxan Hockey, MD [facility] [phone]

## 2021-05-17 NOTE — Progress Notes (Addendum)
Progress Note  Patient Name: Shawn Suarez Date of Encounter: 05/17/2021  [facility] Cardiologist: [name], MD   Subjective   Sitting up in the chair. Reports walking this morning, felt well. Still in and out of afib.   Inpatient Medications    Scheduled Meds:  amiodarone  400 mg Oral BID   apixaban  5 mg Oral BID   aspirin EC  81 mg Oral Daily   bisacodyl  10 mg Oral Daily   Or   bisacodyl  10 mg Rectal Daily   cephALEXin  500 mg Oral Q8H   furosemide  40 mg Intravenous Daily   mouth rinse  15 mL Mouth Rinse BID   [START ON 05/18/2021] metoprolol succinate  75 mg Oral Daily   pantoprazole  40 mg Oral Daily   potassium chloride  20 mEq Oral Daily   sodium chloride flush  3 mL Intravenous Q12H   sodium chloride flush  3 mL Intravenous Q12H   Continuous Infusions:  sodium chloride     sodium chloride     PRN Meds: sodium chloride, sodium chloride, acetaminophen, alum & mag hydroxide-simeth, lactulose, magnesium hydroxide, metoprolol tartrate, mometasone-formoterol, ondansetron (ZOFRAN) IV, oxyCODONE, sodium chloride flush, sodium chloride flush, traMADol, zolpidem   Vital Signs    Vitals:   05/17/21 0333 05/17/21 0531 05/17/21 0634 05/17/21 0901  BP:  119/76 112/67 (!) 127/100  Pulse:   75 100  Resp:  18 18 19   Temp:  98.1 F (36.7 C) 98.1 F (36.7 C) 98.1 F (36.7 C)  TempSrc:  Oral Oral Oral  SpO2:  97% 97% 97%  Weight: 119.9 kg     Height:        Intake/Output Summary (Last 24 hours) at 05/17/2021 0906 Last data filed at 05/17/2021 0901 Gross per 24 hour  Intake 240 ml  Output 450 ml  Net -210 ml   Last 3 Weights 05/17/2021 05/16/2021 05/15/2021  Weight (lbs) 264 lb 5.3 oz 265 lb 11.2 oz 261 lb 14.4 oz  Weight (kg) 119.9 kg 120.521 kg 118.797 kg      Telemetry    SR with intermittent Afib RVR (rates 100-140s) - Personally Reviewed  ECG    No new tracing  Physical Exam   GEN: No acute distress.   Neck: No JVD Cardiac: Irreg Irreg, no murmurs,  rubs, or gallops.  Respiratory: Clear to auscultation bilaterally. GI: Soft, nontender, non-distended  MS: 1-2+ bilateral LE edema; No deformity. Neuro:  Nonfocal  Psych: Normal affect   Labs    High Sensitivity Troponin:  No results for input(s): TROPONINIHS in the last 720 hours.   Chemistry Recent Labs  Lab 05/11/21 0412 05/11/21 1615 05/12/21 0447 05/13/21 0314 05/15/21 0721 05/16/21 0120  NA 138 136   < > 136 136 139  K 4.3 4.0   < > 4.4 3.8 4.0  CL 110 106   < > 103 99 100  CO2 22 22   < > 24 26 27   GLUCOSE 139* 191*   < > 159* 135* 98  BUN 13 16   < > 20 21* 24*  CREATININE 1.19 1.28*   < > 1.18 1.13 1.07  CALCIUM 8.1* 7.9*   < > 8.7* 8.6* 8.9  MG 2.6* 2.5*  --   --   --  2.4  GFRNONAA >60 >60   < > >60 >60 >60  ANIONGAP 6 8   < > 9 11 12    < > =  values in this interval not displayed.    Lipids No results for input(s): CHOL, TRIG, HDL, LABVLDL, LDLCALC, CHOLHDL in the last 168 hours.  Hematology Recent Labs  Lab 05/12/21 0447 05/13/21 0314 05/15/21 0721  WBC 16.9* 17.3* 11.3*  RBC 3.04* 3.06* 2.89*  HGB 9.0* 8.8* 8.2*  HCT 26.6* 26.6* 25.6*  MCV 87.5 86.9 88.6  MCH 29.6 28.8 28.4  MCHC 33.8 33.1 32.0  RDW 14.0 14.4 14.9  PLT 110* 137* 252   Thyroid No results for input(s): TSH, FREET4 in the last 168 hours.  BNPNo results for input(s): BNP, PROBNP in the last 168 hours.  DDimer No results for input(s): DDIMER in the last 168 hours.   Radiology    No results found.  Cardiac Studies   Echo: 03/10/2021   IMPRESSIONS     1. Left ventricular ejection fraction, by estimation, is 60 to 65%. The  left ventricle has normal function. The left ventricle has no regional  wall motion abnormalities. There is mild left ventricular hypertrophy.  Left ventricular diastolic parameters  were normal.   2. Right ventricular systolic function is normal. The right ventricular  size is normal. Tricuspid regurgitation signal is inadequate for assessing  PA  pressure.   3. The mitral valve is normal in structure. Trivial mitral valve  regurgitation. No evidence of mitral stenosis.   4. The aortic valve is grossly normal. Aortic valve regurgitation is not  visualized. No aortic stenosis is present.   5. The inferior vena cava is normal in size with greater than 50%  respiratory variability, suggesting right atrial pressure of 3 mmHg.   FINDINGS   Left Ventricle: Left ventricular ejection fraction, by estimation, is 60  to 65%. The left ventricle has normal function. The left ventricle has no  regional wall motion abnormalities. Definity contrast agent was given IV  to delineate the left ventricular   endocardial borders. The left ventricular internal cavity size was normal  in size. There is mild left ventricular hypertrophy. Left ventricular  diastolic parameters were normal.   Right Ventricle: The right ventricular size is normal. No increase in  right ventricular wall thickness. Right ventricular systolic function is  normal. Tricuspid regurgitation signal is inadequate for assessing PA  pressure.   Left Atrium: Left atrial size was normal in size.   Right Atrium: Right atrial size was normal in size.   Pericardium: There is no evidence of pericardial effusion.   Mitral Valve: The mitral valve is normal in structure. Trivial mitral  valve regurgitation. No evidence of mitral valve stenosis.   Tricuspid Valve: The tricuspid valve is normal in structure. Tricuspid  valve regurgitation is not demonstrated. No evidence of tricuspid  stenosis.   Aortic Valve: The aortic valve is grossly normal. Aortic valve  regurgitation is not visualized. No aortic stenosis is present.   Pulmonic Valve: The pulmonic valve was normal in structure. Pulmonic valve  regurgitation is not visualized. No evidence of pulmonic stenosis.   Aorta: The aortic root is normal in size and structure.   Venous: The inferior vena cava is normal in size with  greater than 50%  respiratory variability, suggesting right atrial pressure of 3 mmHg.   IAS/Shunts: No atrial level shunt detected by color flow Doppler.   Patient Profile     56 y.o. male with a hx of Paroxsymal Afib, PVCs, GERD and atrial myxoma who is being seen 05/16/2021 for the evaluation of atrial fibrillation at the request of Dr. Roxan Hockey.  Assessment & Plan    Atrial fibrillation: Does have hx noted on Zio patch as an outpatient prior to surgery. Continues to have intermittent episodes of atrial fib with RVR. Treated with IV amiodarone and transitioned to 400mg  BID with several 150mg  IV boluses.  -- Rhythm is difficult as he is flipping in and out of afib. He is also significantly volume overloaded on exam which is likely contributing.  -- remains on IV amiodarone 400mg  BID, Toprol was increased to 75mg  daily this morning -- on Eliquis 5mg  BID   Post op volume overload: 2+ LE edema along with swelling in arms. Remains net + 2.4L, suspect this is contributing to ongoing issues with atrial fibrillation as well -- received IV lasix this morning, reports 500cc UOP thur far. Will follow   S/p resection of atrial myxoma: done by Dr. Roxan Hockey on 2/1   For questions or updates, please contact [facility] Please consult [email or web address] for contact info under        Signed, Reino Bellis, NP  05/17/2021, 9:06 AM    I have examined the patient and reviewed assessment and plan and discussed with patient.  Agree with above as stated.    Continue with IV Lasix.  Hopefully this will help with volume overload.  Had some palpitations this AM but converted back to NSR.  Continue to monitor electrolytes.    Larae Grooms

## 2021-05-17 NOTE — Progress Notes (Signed)
Patient converted back to sinus rhythm with a rate in the 70's.

## 2021-05-17 NOTE — Progress Notes (Signed)
CARDIAC REHAB PHASE I   Pt seen ambulating in hallway independently. Pt states frustration with uncontrolled HR. Can feel palpitations when rate increased in Afib. Rates 70s SR - 150s Afib. Will continue to follow.  Rufina Falco, RN BSN 05/17/2021 9:21 AM

## 2021-05-17 NOTE — Progress Notes (Signed)
Mobility Specialist Progress Note   05/17/21 1050  Mobility  Activity Ambulated independently in hallway  Level of Assistance Independent after set-up  Assistive Device None  Distance Ambulated (ft) 690 ft  Activity Response Tolerated well  $Mobility charge 1 Mobility   Received pt in hall having no complaints and presenting a stable HR(79). Monitored HR for remainder of ambulation and asymptomatic throughout, returned back to chair w/ call bell in reach and all needs met.  Holland Falling Mobility Specialist Phone Number [phone]

## 2021-05-17 NOTE — Progress Notes (Signed)
°   05/17/21 0531  Vitals  Temp 98.1 F (36.7 C)  Temp Source Oral  BP 119/76  MAP (mmHg) 90  BP Location Right Arm  BP Method Automatic  Patient Position (if appropriate) Lying  Pulse Rate Source Monitor  ECG Heart Rate (!) 133  Resp 18  Level of Consciousness  Level of Consciousness Alert  MEWS COLOR  MEWS Score Color Yellow  Oxygen Therapy  SpO2 97 %  O2 Device Room Air  MEWS Score  MEWS Temp 0  MEWS Systolic 0  MEWS Pulse 3  MEWS RR 0  MEWS LOC 0  MEWS Score 3   Patient back in Afib with rate of 130's. Patient resting in bed and asymptomatic. Administered 2.5 mg of IV Metoprolol for heart rate.

## 2021-05-18 LAB — BASIC METABOLIC PANEL
Anion gap: 12 (ref 5–15)
BUN: 20 mg/dL (ref 6–20)
CO2: 22 mmol/L (ref 22–32)
Calcium: 8.7 mg/dL — ABNORMAL LOW (ref 8.9–10.3)
Chloride: 102 mmol/L (ref 98–111)
Creatinine, Ser: 1.14 mg/dL (ref 0.61–1.24)
GFR, Estimated: >60 mL/min (ref >60)
Glucose, Bld: 109 mg/dL — ABNORMAL HIGH (ref 70–99)
Potassium: 3.7 mmol/L (ref 3.5–5.1)
Sodium: 136 mmol/L (ref 135–145)

## 2021-05-18 LAB — MAGNESIUM: Magnesium: 2.1 mg/dL (ref 1.7–2.4)

## 2021-05-18 MED ORDER — AMIODARONE IV BOLUS ONLY 150 MG/100ML
150.0000 mg | Freq: Once | INTRAVENOUS | Status: DC
  Filled 2021-05-18: qty 100

## 2021-05-18 MED ORDER — POTASSIUM CHLORIDE CRYS ER 20 MEQ PO TBCR
20.0000 meq | EXTENDED_RELEASE_TABLET | Freq: Every day | ORAL | Status: DC
  Administered 2021-05-19 – 2021-05-22 (×4): 20 meq via ORAL
  Filled 2021-05-18 (×4): qty 1

## 2021-05-18 MED ORDER — POTASSIUM CHLORIDE CRYS ER 20 MEQ PO TBCR: 30.0000 meq | EXTENDED_RELEASE_TABLET | Freq: Two times a day (BID) | ORAL | Status: DC

## 2021-05-18 MED ORDER — DIGOXIN 125 MCG PO TABS
0.1250 mg | ORAL_TABLET | Freq: Every day | ORAL | Status: DC
  Administered 2021-05-19: 0.125 mg via ORAL
  Filled 2021-05-18: qty 1

## 2021-05-18 MED ORDER — METOPROLOL TARTRATE 25 MG PO TABS
25.0000 mg | ORAL_TABLET | Freq: Three times a day (TID) | ORAL | Status: DC
  Administered 2021-05-18 – 2021-05-22 (×11): 25 mg via ORAL
  Filled 2021-05-18 (×11): qty 1

## 2021-05-18 MED ORDER — POTASSIUM CHLORIDE CRYS ER 20 MEQ PO TBCR
40.0000 meq | EXTENDED_RELEASE_TABLET | Freq: Two times a day (BID) | ORAL | Status: AC
  Administered 2021-05-18 (×2): 40 meq via ORAL
  Filled 2021-05-18 (×2): qty 2

## 2021-05-18 MED ORDER — FUROSEMIDE 40 MG PO TABS: 40.0000 mg | ORAL_TABLET | Freq: Every day | ORAL | Status: DC

## 2021-05-18 MED ORDER — DIGOXIN 0.25 MG/ML IJ SOLN
0.5000 mg | Freq: Every day | INTRAMUSCULAR | Status: AC
  Administered 2021-05-18: 0.5 mg via INTRAVENOUS
  Filled 2021-05-18: qty 2

## 2021-05-18 MED ORDER — FUROSEMIDE 10 MG/ML IJ SOLN
40.0000 mg | Freq: Every day | INTRAMUSCULAR | Status: DC
  Administered 2021-05-19: 40 mg via INTRAVENOUS
  Filled 2021-05-18: qty 4

## 2021-05-18 NOTE — Progress Notes (Addendum)
Progress Note  Patient Name: Shawn Suarez Date of Encounter: 05/18/2021  [facility] Cardiologist: [name], MD   Subjective   Had a good day yesterday, back in afib this morning. Frustrated by this.   Inpatient Medications    Scheduled Meds:  amiodarone  400 mg Oral BID   apixaban  5 mg Oral BID   aspirin EC  81 mg Oral Daily   bisacodyl  10 mg Oral Daily   Or   bisacodyl  10 mg Rectal Daily   cephALEXin  500 mg Oral Q8H   [START ON 05/19/2021] digoxin  0.125 mg Oral Daily   [START ON 05/19/2021] furosemide  40 mg Intravenous Daily   [START ON 05/19/2021] furosemide  40 mg Oral Daily   mouth rinse  15 mL Mouth Rinse BID   metoprolol tartrate  25 mg Oral TID   pantoprazole  40 mg Oral Daily   [START ON 05/19/2021] potassium chloride  20 mEq Oral Daily   potassium chloride  40 mEq Oral BID   sodium chloride flush  3 mL Intravenous Q12H   sodium chloride flush  3 mL Intravenous Q12H   Continuous Infusions:  sodium chloride     sodium chloride     PRN Meds: sodium chloride, sodium chloride, acetaminophen, alum & mag hydroxide-simeth, lactulose, magnesium hydroxide, metoprolol tartrate, mometasone-formoterol, ondansetron (ZOFRAN) IV, oxyCODONE, sodium chloride flush, sodium chloride flush, traMADol, zolpidem   Vital Signs    Vitals:   05/17/21 2025 05/17/21 2320 05/18/21 0415 05/18/21 0754  BP: 96/66 (!) 108/59 99/63 101/77  Pulse: 75 73 73 (!) 134  Resp: 18  16 18   Temp: 98.4 F (36.9 C) 98.2 F (36.8 C) 98 F (36.7 C) 99 F (37.2 C)  TempSrc: Oral Oral Oral Oral  SpO2: 97% 94% 98% 97%  Weight:   119 kg   Height:        Intake/Output Summary (Last 24 hours) at 05/18/2021 0934 Last data filed at 05/17/2021 2326 Gross per 24 hour  Intake --  Output 80 ml  Net -80 ml   Last 3 Weights 05/18/2021 05/17/2021 05/16/2021  Weight (lbs) 262 lb 6.4 oz 264 lb 5.3 oz 265 lb 11.2 oz  Weight (kg) 119.024 kg 119.9 kg 120.521 kg      Telemetry    SR most of the day  yesterday until converting to afib around 6am this morning - Personally Reviewed  ECG    No new tracing  Physical Exam   GEN: No acute distress.   Neck: No JVD Cardiac: Irreg Irreg, no murmurs, rubs, or gallops.  Respiratory: Clear to auscultation bilaterally. GI: Soft, nontender, non-distended  MS: 1-2+ bilateral LE edema (improving); No deformity. Neuro:  Nonfocal  Psych: Normal affect   Labs    High Sensitivity Troponin:  No results for input(s): TROPONINIHS in the last 720 hours.   Chemistry Recent Labs  Lab 05/11/21 1615 05/12/21 0447 05/15/21 0721 05/16/21 0120 05/18/21 0150  NA 136   < > 136 139 136  K 4.0   < > 3.8 4.0 3.7  CL 106   < > 99 100 102  CO2 22   < > 26 27 22   GLUCOSE 191*   < > 135* 98 109*  BUN 16   < > 21* 24* 20  CREATININE 1.28*   < > 1.13 1.07 1.14  CALCIUM 7.9*   < > 8.6* 8.9 8.7*  MG 2.5*  --   --  2.4  2.1  GFRNONAA >60   < > >60 >60 >60  ANIONGAP 8   < > 11 12 12    < > = values in this interval not displayed.    Lipids No results for input(s): CHOL, TRIG, HDL, LABVLDL, LDLCALC, CHOLHDL in the last 168 hours.  Hematology Recent Labs  Lab 05/12/21 0447 05/13/21 0314 05/15/21 0721  WBC 16.9* 17.3* 11.3*  RBC 3.04* 3.06* 2.89*  HGB 9.0* 8.8* 8.2*  HCT 26.6* 26.6* 25.6*  MCV 87.5 86.9 88.6  MCH 29.6 28.8 28.4  MCHC 33.8 33.1 32.0  RDW 14.0 14.4 14.9  PLT 110* 137* 252   Thyroid No results for input(s): TSH, FREET4 in the last 168 hours.  BNPNo results for input(s): BNP, PROBNP in the last 168 hours.  DDimer No results for input(s): DDIMER in the last 168 hours.   Radiology    No results found.  Cardiac Studies   Echo: 03/10/2021   IMPRESSIONS     1. Left ventricular ejection fraction, by estimation, is 60 to 65%. The  left ventricle has normal function. The left ventricle has no regional  wall motion abnormalities. There is mild left ventricular hypertrophy.  Left ventricular diastolic parameters  were normal.   2.  Right ventricular systolic function is normal. The right ventricular  size is normal. Tricuspid regurgitation signal is inadequate for assessing  PA pressure.   3. The mitral valve is normal in structure. Trivial mitral valve  regurgitation. No evidence of mitral stenosis.   4. The aortic valve is grossly normal. Aortic valve regurgitation is not  visualized. No aortic stenosis is present.   5. The inferior vena cava is normal in size with greater than 50%  respiratory variability, suggesting right atrial pressure of 3 mmHg.   FINDINGS   Left Ventricle: Left ventricular ejection fraction, by estimation, is 60  to 65%. The left ventricle has normal function. The left ventricle has no  regional wall motion abnormalities. Definity contrast agent was given IV  to delineate the left ventricular   endocardial borders. The left ventricular internal cavity size was normal  in size. There is mild left ventricular hypertrophy. Left ventricular  diastolic parameters were normal.   Right Ventricle: The right ventricular size is normal. No increase in  right ventricular wall thickness. Right ventricular systolic function is  normal. Tricuspid regurgitation signal is inadequate for assessing PA  pressure.   Left Atrium: Left atrial size was normal in size.   Right Atrium: Right atrial size was normal in size.   Pericardium: There is no evidence of pericardial effusion.   Mitral Valve: The mitral valve is normal in structure. Trivial mitral  valve regurgitation. No evidence of mitral valve stenosis.   Tricuspid Valve: The tricuspid valve is normal in structure. Tricuspid  valve regurgitation is not demonstrated. No evidence of tricuspid  stenosis.   Aortic Valve: The aortic valve is grossly normal. Aortic valve  regurgitation is not visualized. No aortic stenosis is present.   Pulmonic Valve: The pulmonic valve was normal in structure. Pulmonic valve  regurgitation is not visualized. No  evidence of pulmonic stenosis.   Aorta: The aortic root is normal in size and structure.   Venous: The inferior vena cava is normal in size with greater than 50%  respiratory variability, suggesting right atrial pressure of 3 mmHg.   IAS/Shunts: No atrial level shunt detected by color flow Doppler.   Patient Profile     56 y.o. male with a hx  of Paroxsymal Afib, PVCs, GERD and atrial myxoma who is being seen 05/16/2021 for the evaluation of atrial fibrillation at the request of Dr. Roxan Hockey.    Assessment & Plan    Atrial fibrillation with RVR: Does have hx noted on Zio patch as an outpatient prior to surgery. Continues to have intermittent episodes of atrial fib with RVR.  -- Treated with IV amiodarone and transitioned to 400mg  BID with several 150mg  IV boluses.  -- Rhythm is difficult as he is flipping in and out of afib but did sustain SR for most of the day yesterday. Still volume overloaded on exam which is likely contributing, but has responded well to IV lasix  -- remains on amiodarone 400mg  BID, Toprol was increased to 75mg  daily today, but BPs are soft. Will split metoprolol into 25mg  TID -- started on dig per TCTS  -- on Eliquis 5mg  BID   Post op volume overload: 2+ LE edema along with swelling in arms. Remains net + 2.4L, suspect this is contributing to ongoing issues with atrial fibrillation as well -- receiving IV lasix, UOP not documented as he/wife emptying urinal but they report 2L out yesterday, weight is down 2lbs -- will continue IV for now -- I&Os -- follow BMET   S/p resection of atrial myxoma: done by Dr. Roxan Hockey on 2/1  Possible OSA: reports snoring -- will need outpatient sleep study, can arrange at follow up visit    For questions or updates, please contact [facility] Please consult [email or web address] for contact info under        Signed, Reino Bellis, NP  05/18/2021, 9:34 AM    I have examined the patient and reviewed assessment and plan and  discussed with patient.  Agree with above as stated.    CHange frequency of metoprolol to see if this gives better rhythm control. COntinue Amio and diuresis.     Larae Grooms

## 2021-05-18 NOTE — Progress Notes (Signed)
Mobility Specialist Progress Note   05/18/21 1610  Mobility  Activity Ambulated independently in hallway  Level of Assistance Standby assist, set-up cues, supervision of patient - no hands on  Assistive Device None  Distance Ambulated (ft) 470 ft  Activity Response Tolerated well  $Mobility charge 1 Mobility   Received pt in chair having no complaints and agreeable to mobility. Asymptomatic throughout and VSS during ambulation, returned back to chair w/ call bell in reach and all needs met and RN present.  Pre Mobility: 74 HR During Mobility: 100 HR Post Mobility: 77 HR, BP  Holland Falling Mobility Specialist Phone Number [phone]

## 2021-05-18 NOTE — Progress Notes (Addendum)
Patient converted back to NSR rate in 80s  at 1200 will monitor. Nkechi Linehan, Bettina Gavia RN

## 2021-05-18 NOTE — Progress Notes (Signed)
CARDIAC REHAB PHASE I   Offered to walk with pt. Pt states fatigue after being in Afib all morning. Pt states continued frustration about heart rate but glad he is in th hospital while they work on it. Provided support and encouragement. Encouraged ambulation and IS use as able.  [id number] Rufina Falco, RN BSN 05/18/2021 1:21 PM

## 2021-05-18 NOTE — Progress Notes (Addendum)
° °   °  [address]       [place] [address]             [phone]        8 Days Post-Op Procedure(s) (LRB): MINIMALLY INVASIVE EXCISION OF ATRIAL MYXOMA (Right) PARTIAL CRYOMAZE PROCEDURE (N/A) TRANSESOPHAGEAL ECHOCARDIOGRAM (TEE) MEDIAN STERNOTOMY (N/A) APPLICATION OF CELL SAVER (N/A)  Subjective: Patient sitting in chair this am speaking with wife. He states a fib rate high yet again  Objective: Vital signs in last 24 hours: Temp:  [98 F (36.7 C)-98.4 F (36.9 C)] 98 F (36.7 C) (02/09 0415) Pulse Rate:  [70-100] 73 (02/09 0415) Cardiac Rhythm: Normal sinus rhythm (02/08 1900) Resp:  [16-19] 16 (02/09 0415) BP: (96-127)/(59-100) 99/63 (02/09 0415) SpO2:  [94 %-98 %] 98 % (02/09 0415) Weight:  [458 kg] 119 kg (02/09 0415)  Pre op weight 117.9 kg Current Weight  05/18/21 119 kg       Intake/Output from previous day: 02/08 0701 - 02/09 0700 In: -  Out: 380 [Urine:380]   Physical Exam:  Cardiovascular: IRRR IRRR Pulmonary: Slightly diminished bibasilar breath sounds Abdomen: Soft, non tender, bowel sounds present. Extremities: Mild bilateral lower extremity edema. Right hand with swelling;erythema and is painful.Mild phlebitis secondary to infiltrated IV.  Wounds: Sternal right anterior, and right groin wounds are clean and dry.  No erythema or signs of infection.  Lab Results: CBC: Recent Labs    05/15/21 0721  WBC 11.3*  HGB 8.2*  HCT 25.6*  PLT 252    BMET:  Recent Labs    05/16/21 0120 05/18/21 0150  NA 139 136  K 4.0 3.7  CL 100 102  CO2 27 22  GLUCOSE 98 109*  BUN 24* 20  CREATININE 1.07 1.14  CALCIUM 8.9 8.7*     PT/INR:  Lab Results  Component Value Date   INR 1.4 (H) 05/10/2021   INR 0.9 05/08/2021   ABG:  INR: Will add last result for INR, ABG once components are confirmed Will add last 4 CBG results once components are confirmed  Assessment/Plan:  1. CV - History of PAF. S/p pulmonary vein  isolation. In a fib with RVR this am-HR up to 130's. On Amiodarone 400 mg bid,Toprol XL 75 mg daily,Apixaban 5 mg bid. SBP more labile (in the 90's). Patient asked if could consider another anti arrhythmic I.e Cardizem or Digoxin. Cardiology following. 2.  Pulmonary - On room air. Encourage incentive spirometer. 3. Volume Overload - Continue Lasix 40 mg IV daily 4.  Expected post op acute blood loss anemia - H and H yesterday 8.2 and 25.6  5. Phlebitis posterior right hand-continue Keflex 6. Supplement potassium 7. Patient concerned he has OSA. Will need sleep study as an outpatient.  Donielle M ZimmermanPA-C 05/18/2021,7:05 AM   Patient seen and examined, agree with above He is back in A fib this morning after being in SR most of day yesterday BP relatively low, so can't go up on beta blocker or use calcium blocker Will add Digoxin Erythema right hand stable, swelling slightly improved  Remo Lipps C. Roxan Hockey, MD [facility] [phone]

## 2021-05-19 MED ORDER — FUROSEMIDE 10 MG/ML IJ SOLN
40.0000 mg | Freq: Once | INTRAMUSCULAR | Status: AC
  Administered 2021-05-20: 40 mg via INTRAVENOUS
  Filled 2021-05-19: qty 4

## 2021-05-19 MED ORDER — FUROSEMIDE 40 MG PO TABS: 40.0000 mg | ORAL_TABLET | Freq: Every day | ORAL | Status: DC

## 2021-05-19 MED ORDER — FUROSEMIDE 40 MG PO TABS
40.0000 mg | ORAL_TABLET | Freq: Every day | ORAL | Status: DC
  Administered 2021-05-21 – 2021-05-22 (×2): 40 mg via ORAL
  Filled 2021-05-19 (×2): qty 1

## 2021-05-19 MED ORDER — HYDROCORTISONE (PERIANAL) 2.5 % EX CREA
TOPICAL_CREAM | Freq: Two times a day (BID) | CUTANEOUS | Status: DC
  Filled 2021-05-19: qty 28.35

## 2021-05-19 MED ORDER — SODIUM CHLORIDE 0.9% FLUSH
3.0000 mL | Freq: Two times a day (BID) | INTRAVENOUS | Status: DC
  Administered 2021-05-19 – 2021-05-22 (×6): 3 mL via INTRAVENOUS

## 2021-05-19 NOTE — Progress Notes (Signed)
Mobility Specialist Progress Note:   05/19/21 1530  Mobility  Activity Ambulated independently in hallway  Level of Binger None   Pt seen ambulating in hallway independently with visitor.   Nelta Numbers Mobility Specialist  Phone: [phone]

## 2021-05-19 NOTE — Progress Notes (Signed)
CARDIAC REHAB PHASE I   D/c education completed with pt and wife. Pt educated on importance of site care and monitoring incisions daily. Encouraged continued IS use, walks, and sternal precautions. Pt given in-the-tube sheet along with heart healthy and diabetic diets. Reviewed restrictions and exercise guidelines. Will refer to CRP II [place].  [id number] Rufina Falco, RN BSN 05/19/2021 9:01 AM

## 2021-05-19 NOTE — Progress Notes (Addendum)
Progress Note  Patient Name: Shawn Suarez Date of Encounter: 05/19/2021  [facility] Cardiologist: [name], MD   Subjective   Rhythm much more stable yesterday.  Inpatient Medications    Scheduled Meds:  amiodarone  400 mg Oral BID   apixaban  5 mg Oral BID   aspirin EC  81 mg Oral Daily   bisacodyl  10 mg Oral Daily   Or   bisacodyl  10 mg Rectal Daily   cephALEXin  500 mg Oral Q8H   digoxin  0.125 mg Oral Daily   furosemide  40 mg Intravenous Daily   hydrocortisone   Rectal BID   mouth rinse  15 mL Mouth Rinse BID   metoprolol tartrate  25 mg Oral TID   pantoprazole  40 mg Oral Daily   potassium chloride  20 mEq Oral Daily   sodium chloride flush  3 mL Intravenous Q12H   Continuous Infusions:  PRN Meds: acetaminophen, alum & mag hydroxide-simeth, lactulose, magnesium hydroxide, metoprolol tartrate, mometasone-formoterol, ondansetron (ZOFRAN) IV, oxyCODONE, traMADol, zolpidem   Vital Signs    Vitals:   05/19/21 0229 05/19/21 0414 05/19/21 0843 05/19/21 1119  BP:  110/71 121/71 103/74  Pulse:  70 72 75  Resp:  18 20 20   Temp:  98.1 F (36.7 C) 98 F (36.7 C) 98.2 F (36.8 C)  TempSrc:  Oral Oral Oral  SpO2:  96% 97% 97%  Weight: 118.4 kg     Height:        Intake/Output Summary (Last 24 hours) at 05/19/2021 1139 Last data filed at 05/19/2021 0417 Gross per 24 hour  Intake 280 ml  Output 750 ml  Net -470 ml   Last 3 Weights 05/19/2021 05/18/2021 05/17/2021  Weight (lbs) 261 lb 1.6 oz 262 lb 6.4 oz 264 lb 5.3 oz  Weight (kg) 118.434 kg 119.024 kg 119.9 kg      Telemetry    Normal sinus rhythm in the past 24 hours- Personally Reviewed  ECG      Physical Exam   GEN: No acute distress.   Neck: No JVD Cardiac: RRR, no murmurs, rubs, or gallops.  Respiratory: Clear to auscultation bilaterally. GI: Soft, nontender, non-distended  MS: Upper or and lower extremity edema; lower extremity edema improved from prior exam, no deformity. Neuro:   Nonfocal  Psych: Normal affect   Labs    High Sensitivity Troponin:  No results for input(s): TROPONINIHS in the last 720 hours.   Chemistry Recent Labs  Lab 05/15/21 0721 05/16/21 0120 05/18/21 0150  NA 136 139 136  K 3.8 4.0 3.7  CL 99 100 102  CO2 26 27 22   GLUCOSE 135* 98 109*  BUN 21* 24* 20  CREATININE 1.13 1.07 1.14  CALCIUM 8.6* 8.9 8.7*  MG  --  2.4 2.1  GFRNONAA >60 >60 >60  ANIONGAP 11 12 12     Lipids No results for input(s): CHOL, TRIG, HDL, LABVLDL, LDLCALC, CHOLHDL in the last 168 hours.  Hematology Recent Labs  Lab 05/13/21 0314 05/15/21 0721  WBC 17.3* 11.3*  RBC 3.06* 2.89*  HGB 8.8* 8.2*  HCT 26.6* 25.6*  MCV 86.9 88.6  MCH 28.8 28.4  MCHC 33.1 32.0  RDW 14.4 14.9  PLT 137* 252   Thyroid No results for input(s): TSH, FREET4 in the last 168 hours.  BNPNo results for input(s): BNP, PROBNP in the last 168 hours.  DDimer No results for input(s): DDIMER in the last 168 hours.   Radiology  No results found.  Cardiac Studies   Normal LV function by prior echo  Patient Profile     56 y.o. male with myxoma removal, subsequent perioperative atrial fibrillation  Assessment & Plan    Atrial fibrillation: Continue oral amiodarone to maintain rhythm.  D/C digoxin. Eliquis for stroke prevention.  He did report some hemorrhoidal bleeding.  Anusol was ordered.  Volume overload improved: Continue diuresis.     For questions or updates, please contact [place] Please consult [email or web address] for contact info under        Signed, Larae Grooms, MD  05/19/2021, 11:39 AM

## 2021-05-19 NOTE — Progress Notes (Signed)
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) PARTIAL CRYOMAZE PROCEDURE (N/A) TRANSESOPHAGEAL ECHOCARDIOGRAM (TEE) MEDIAN STERNOTOMY (N/A) APPLICATION OF CELL SAVER (N/A) Subjective: Feels reasonably well, minimal pain  Objective: Vital signs in last 24 hours: Temp:  [98 F (36.7 C)-99 F (37.2 C)] 98.1 F (36.7 C) (02/10 0414) Pulse Rate:  [68-145] 70 (02/10 0414) Cardiac Rhythm: Normal sinus rhythm (02/09 1900) Resp:  [16-20] 18 (02/10 0414) BP: (101-114)/(58-96) 110/71 (02/10 0414) SpO2:  [96 %-100 %] 96 % (02/10 0414) Weight:  [118.4 kg] 118.4 kg (02/10 0229)  Hemodynamic parameters for last 24 hours:    Intake/Output from previous day: 02/09 0701 - 02/10 0700 In: 280 [P.O.:280] Out: 1650 [Urine:1650] Intake/Output this shift: No intake/output data recorded.  General appearance: alert, cooperative, and no distress Heart: regular rate and rhythm Lungs: mildly dim in bases Abdomen: soft, non tender Extremities: + pitting edema Wound: incis healing well  Lab Results: No results for input(s): WBC, HGB, HCT, PLT in the last 72 hours. BMET:  Recent Labs    05/18/21 0150  NA 136  K 3.7  CL 102  CO2 22  GLUCOSE 109*  BUN 20  CREATININE 1.14  CALCIUM 8.7*    PT/INR: No results for input(s): LABPROT, INR in the last 72 hours. ABG    Component Value Date/Time   PHART 7.325 (L) 05/10/2021 2332   HCO3 23.5 05/10/2021 2332   TCO2 25 05/10/2021 2332   ACIDBASEDEF 2.0 05/10/2021 2332   O2SAT 97.0 05/10/2021 2332   CBG (last 3)  No results for input(s): GLUCAP in the last 72 hours.  Meds Scheduled Meds:  amiodarone  400 mg Oral BID   apixaban  5 mg Oral BID   aspirin EC  81 mg Oral Daily   bisacodyl  10 mg Oral Daily   Or   bisacodyl  10 mg Rectal Daily   cephALEXin  500 mg Oral Q8H   digoxin  0.125 mg Oral Daily    furosemide  40 mg Intravenous Daily   furosemide  40 mg Oral Daily   mouth rinse  15 mL Mouth Rinse BID   metoprolol tartrate  25 mg Oral TID   pantoprazole  40 mg Oral Daily   potassium chloride  20 mEq Oral Daily   sodium chloride flush  3 mL Intravenous Q12H   sodium chloride flush  3 mL Intravenous Q12H   Continuous Infusions:  sodium chloride     sodium chloride     PRN Meds:.sodium chloride, sodium chloride, acetaminophen, alum & mag hydroxide-simeth, lactulose, magnesium hydroxide, metoprolol tartrate, mometasone-formoterol, ondansetron (ZOFRAN) IV, oxyCODONE, sodium chloride flush, sodium chloride flush, traMADol, zolpidem  Xrays No results found.  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

## 2021-05-20 ENCOUNTER — Inpatient Hospital Stay (HOSPITAL_COMMUNITY): Payer: Federal, State, Local not specified - PPO

## 2021-05-20 LAB — CBC
HCT: 29 % — ABNORMAL LOW (ref 39.0–52.0)
Hemoglobin: 9 g/dL — ABNORMAL LOW (ref 13.0–17.0)
MCH: 28.4 pg (ref 26.0–34.0)
MCHC: 31 g/dL (ref 30.0–36.0)
MCV: 91.5 fL (ref 80.0–100.0)
Platelets: 459 10*3/uL — ABNORMAL HIGH (ref 150–400)
RBC: 3.17 MIL/uL — ABNORMAL LOW (ref 4.22–5.81)
RDW: 15.6 % — ABNORMAL HIGH (ref 11.5–15.5)
WBC: 9.5 10*3/uL (ref 4.0–10.5)
nRBC: 0 % (ref 0.0–0.2)

## 2021-05-20 LAB — BASIC METABOLIC PANEL
Anion gap: 10 (ref 5–15)
BUN: 23 mg/dL — ABNORMAL HIGH (ref 6–20)
CO2: 22 mmol/L (ref 22–32)
Calcium: 9.3 mg/dL (ref 8.9–10.3)
Chloride: 105 mmol/L (ref 98–111)
Creatinine, Ser: 1.24 mg/dL (ref 0.61–1.24)
GFR, Estimated: >60 mL/min (ref >60)
Glucose, Bld: 114 mg/dL — ABNORMAL HIGH (ref 70–99)
Potassium: 4.5 mmol/L (ref 3.5–5.1)
Sodium: 137 mmol/L (ref 135–145)

## 2021-05-20 MED ORDER — FUROSEMIDE 10 MG/ML IJ SOLN
40.0000 mg | Freq: Once | INTRAMUSCULAR | Status: AC
  Administered 2021-05-20: 40 mg via INTRAVENOUS
  Filled 2021-05-20: qty 4

## 2021-05-20 NOTE — Progress Notes (Addendum)
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 No new concerns except persistent LE swelling.  He is aware of multiple episodes of PAF.  Says he has had a BM.   Objective: Vital signs in last 24 hours: Temp:  [98 F (36.7 C)-98.5 F (36.9 C)] 98.1 F (36.7 C) (02/11 0334) Pulse Rate:  [66-100] 66 (02/11 0334) Cardiac Rhythm: Normal sinus rhythm (02/11 0127) Resp:  [18-20] 18 (02/11 0334) BP: (90-121)/(62-74) 91/62 (02/11 0334) SpO2:  [97 %-100 %] 98 % (02/11 0334) Weight:  [258 kg] 118 kg (02/11 0334)  Hemodynamic parameters for last 24 hours:    Intake/Output from previous day: 02/10 0701 - 02/11 0700 In: 0  Out: 2150 [Urine:2150] Intake/Output this shift: No intake/output data recorded.  General appearance: alert, cooperative, and no distress Heart: currently in SR but continues to have PAF with CVR. No murmur.  Lungs: breath sounds clear,diminished  Abdomen: soft, non tender Extremities: 2-3+ LE pitting edema Wound: incisions healing well. Phlebitis right hand (IV amio infused at this site). Erythema, swelling, and tenderness improving  Lab Results: Recent Labs    05/20/21 0602  WBC 9.5  HGB 9.0*  HCT 29.0*  PLT 459*   BMET:  Recent Labs    05/18/21 0150 05/20/21 0602  NA 136 137  K 3.7 4.5  CL 102 105  CO2 22 22  GLUCOSE 109* 114*  BUN 20 23*  CREATININE 1.14 1.24  CALCIUM 8.7* 9.3     PT/INR: No results for input(s): LABPROT, INR in the last 72 hours. ABG    Component Value Date/Time   PHART 7.325 (L) 05/10/2021 2332   HCO3 23.5 05/10/2021 2332   TCO2 25 05/10/2021 2332   ACIDBASEDEF 2.0 05/10/2021 2332   O2SAT 97.0 05/10/2021 2332    CBG (last 3)  No results for input(s): GLUCAP in the last 72 hours.  Meds Scheduled Meds:  amiodarone  400 mg Oral BID   apixaban  5 mg Oral BID   aspirin EC  81 mg Oral Daily   bisacodyl  10 mg Oral Daily   Or   bisacodyl  10 mg Rectal Daily   cephALEXin  500 mg Oral Q8H   furosemide  40 mg Intravenous Once   [START ON 05/21/2021] furosemide  40 mg Oral Daily   hydrocortisone   Rectal BID   mouth rinse  15 mL Mouth Rinse BID   metoprolol tartrate  25 mg Oral TID   pantoprazole  40 mg Oral Daily   potassium chloride  20 mEq Oral Daily   sodium chloride flush  3 mL Intravenous Q12H   Continuous Infusions:   PRN Meds:.acetaminophen, alum & mag hydroxide-simeth, lactulose, magnesium hydroxide, metoprolol tartrate, mometasone-formoterol, ondansetron (ZOFRAN) IV, oxyCODONE, traMADol, zolpidem  Xrays No results found.  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 Had rapid AF and some VT earlier this week. Now primarily SR with PAF and controlled VR. On amio, BB and Eliquis per  cardiology.   -Volume excess-  Wt is equivalent to pre-op but still has significant peripheral edema. Continue Lasix.  -Phlebitis right hand- improving. Continue oral Keflex.  -Expected acute blood loss anemia- Hct is trending up.   -Disposition-planning discharge to home when OK with cardiology. Will plan to continue diuretic for several days post discharge.   Antony Odea PA-C Pager [phone] 05/20/2021   Agree with above. Still needs diuresis.

## 2021-05-20 NOTE — Progress Notes (Signed)
Progress Note  Patient Name: Shawn Suarez Date of Encounter: 05/20/2021  [facility] Cardiologist: [name], MD   Subjective   NSR this AM. Continues to have paroxysms of AF.  Inpatient Medications    Scheduled Meds:  amiodarone  400 mg Oral BID   apixaban  5 mg Oral BID   aspirin EC  81 mg Oral Daily   bisacodyl  10 mg Oral Daily   Or   bisacodyl  10 mg Rectal Daily   cephALEXin  500 mg Oral Q8H   [START ON 05/21/2021] furosemide  40 mg Oral Daily   hydrocortisone   Rectal BID   mouth rinse  15 mL Mouth Rinse BID   metoprolol tartrate  25 mg Oral TID   pantoprazole  40 mg Oral Daily   potassium chloride  20 mEq Oral Daily   sodium chloride flush  3 mL Intravenous Q12H   Continuous Infusions:  PRN Meds: acetaminophen, alum & mag hydroxide-simeth, lactulose, magnesium hydroxide, metoprolol tartrate, mometasone-formoterol, ondansetron (ZOFRAN) IV, oxyCODONE, traMADol, zolpidem   Vital Signs    Vitals:   05/19/21 2020 05/19/21 2336 05/20/21 0334 05/20/21 0826  BP: 117/73 90/70 91/62  99/66  Pulse: 73 100 66 70  Resp: 18 18 18 18   Temp: 98.5 F (36.9 C) 98 F (36.7 C) 98.1 F (36.7 C) 97.8 F (36.6 C)  TempSrc: Oral Oral Oral Oral  SpO2: 97% 100% 98% 98%  Weight:   118 kg   Height:        Intake/Output Summary (Last 24 hours) at 05/20/2021 1112 Last data filed at 05/20/2021 0946 Gross per 24 hour  Intake 239 ml  Output 3100 ml  Net -2861 ml    Last 3 Weights 05/20/2021 05/19/2021 05/18/2021  Weight (lbs) 260 lb 3.2 oz 261 lb 1.6 oz 262 lb 6.4 oz  Weight (kg) 118.026 kg 118.434 kg 119.024 kg      Telemetry    Normal sinus rhythm in the past 24 hours- Personally Reviewed  ECG      Physical Exam   GEN: No acute distress.   Neck: No JVD Cardiac: RRR, no murmurs, rubs, or gallops.  Respiratory: Clear to auscultation bilaterally. GI: Soft, nontender, non-distended  MS: 2-3+ pitting bilateral LE edema. Neuro:  Nonfocal  Psych: Normal affect    Labs    High Sensitivity Troponin:  No results for input(s): TROPONINIHS in the last 720 hours.   Chemistry Recent Labs  Lab 05/16/21 0120 05/18/21 0150 05/20/21 0602  NA 139 136 137  K 4.0 3.7 4.5  CL 100 102 105  CO2 27 22 22   GLUCOSE 98 109* 114*  BUN 24* 20 23*  CREATININE 1.07 1.14 1.24  CALCIUM 8.9 8.7* 9.3  MG 2.4 2.1  --   GFRNONAA >60 >60 >60  ANIONGAP 12 12 10      Lipids No results for input(s): CHOL, TRIG, HDL, LABVLDL, LDLCALC, CHOLHDL in the last 168 hours.  Hematology Recent Labs  Lab 05/15/21 0721 05/20/21 0602  WBC 11.3* 9.5  RBC 2.89* 3.17*  HGB 8.2* 9.0*  HCT 25.6* 29.0*  MCV 88.6 91.5  MCH 28.4 28.4  MCHC 32.0 31.0  RDW 14.9 15.6*  PLT 252 459*    Thyroid No results for input(s): TSH, FREET4 in the last 168 hours.  BNPNo results for input(s): BNP, PROBNP in the last 168 hours.  DDimer No results for input(s): DDIMER in the last 168 hours.   Radiology    DG Chest  2 View  Result Date: 05/20/2021 CLINICAL DATA:  56 year old male with history of cardiac surgery. EXAM: CHEST - 2 VIEW COMPARISON:  Chest x-ray 05/13/2021. FINDINGS: Lung volumes are low. Bibasilar opacities favored to reflect areas of subsegmental atelectasis. Small bilateral pleural effusions. No pneumothorax. No evidence of pulmonary edema. Heart size is normal. Upper mediastinal contours are within normal limits. Atherosclerotic calcifications are noted in the thoracic aorta. IMPRESSION: 1. Bibasilar subsegmental atelectasis and small bilateral pleural effusions. Electronically Signed   By: Vinnie Langton M.D.   On: 05/20/2021 08:42    Cardiac Studies   Normal LV function by prior echo  Patient Profile     56 y.o. male with myxoma removal, subsequent perioperative atrial fibrillation  Assessment & Plan    Atrial fibrillation: Continue oral amiodarone to maintain rhythm.  Eliquis for stroke prevention.  He did report some hemorrhoidal bleeding.  Anusol was  ordered.  Volume overload improved: Continue diuresis. Give additional IV dose this afternoon. Elevate legs.     For questions or updates, please contact [place] Please consult [email or web address] for contact info under        Signed, Vickie Epley, MD  05/20/2021, 11:12 AM

## 2021-05-21 LAB — BASIC METABOLIC PANEL
Anion gap: 10 (ref 5–15)
BUN: 21 mg/dL — ABNORMAL HIGH (ref 6–20)
CO2: 25 mmol/L (ref 22–32)
Calcium: 9.1 mg/dL (ref 8.9–10.3)
Chloride: 101 mmol/L (ref 98–111)
Creatinine, Ser: 1.35 mg/dL — ABNORMAL HIGH (ref 0.61–1.24)
GFR, Estimated: >60 mL/min (ref >60)
Glucose, Bld: 136 mg/dL — ABNORMAL HIGH (ref 70–99)
Potassium: 4.3 mmol/L (ref 3.5–5.1)
Sodium: 136 mmol/L (ref 135–145)

## 2021-05-21 MED ORDER — FUROSEMIDE 10 MG/ML IJ SOLN
40.0000 mg | Freq: Two times a day (BID) | INTRAMUSCULAR | Status: AC
  Administered 2021-05-21: 40 mg via INTRAVENOUS
  Filled 2021-05-21: qty 4

## 2021-05-21 MED ORDER — FUROSEMIDE 10 MG/ML IJ SOLN: 40.0000 mg | Freq: Two times a day (BID) | INTRAMUSCULAR | Status: DC

## 2021-05-21 MED ORDER — FUROSEMIDE 10 MG/ML IJ SOLN
40.0000 mg | Freq: Two times a day (BID) | INTRAMUSCULAR | Status: DC
Start: 2021-05-21 — End: 2021-05-21

## 2021-05-21 NOTE — Progress Notes (Addendum)
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 Says he can sense when he goes in and out of afib but otherwise feels well. No new problems.  BM and net 2L diuresis yesterday. Says he feels ready to go home.   Objective: Vital signs in last 24 hours: Temp:  [97.8 F (36.6 C)-98.3 F (36.8 C)] 98.3 F (36.8 C) (02/12 0745) Pulse Rate:  [60-108] 71 (02/12 0745) Cardiac Rhythm: Normal sinus rhythm;Atrial fibrillation (02/11 2005) Resp:  [18-20] 20 (02/12 0745) BP: (95-117)/(57-76) 113/65 (02/12 0745) SpO2:  [93 %-100 %] 96 % (02/12 0745) Weight:  [732 kg] 116 kg (02/12 0518)  Hemodynamic parameters for last 24 hours:    Intake/Output from previous day: 02/11 0701 - 02/12 0700 In: 479 [P.O.:476; I.V.:3] Out: 2751 [Urine:2750; Stool:1] Intake/Output this shift: No intake/output data recorded.  General appearance: alert, cooperative, and no distress Heart: currently in SR but having brief episodes of a-fib with controlled VR. No murmur.  Lungs: no dyspnea on RA.  Abdomen: soft, non tender Extremities: LE edema resolving.  Wound: incisions healing well. Phlebitis right hand improving.  Lab Results: Recent Labs    05/20/21 0602  WBC 9.5  HGB 9.0*  HCT 29.0*  PLT 459*    BMET:  Recent Labs    05/20/21 0602  NA 137  K 4.5  CL 105  CO2 22  GLUCOSE 114*  BUN 23*  CREATININE 1.24  CALCIUM 9.3     PT/INR: No results for input(s): LABPROT, INR in the last 72 hours. ABG    Component Value Date/Time   PHART 7.325 (L) 05/10/2021 2332   HCO3 23.5 05/10/2021 2332   TCO2 25 05/10/2021 2332   ACIDBASEDEF 2.0 05/10/2021 2332   O2SAT 97.0 05/10/2021 2332   CBG (last 3)  No  results for input(s): GLUCAP in the last 72 hours.  Meds Scheduled Meds:  amiodarone  400 mg Oral BID   apixaban  5 mg Oral BID   aspirin EC  81 mg Oral Daily   bisacodyl  10 mg Oral Daily   Or   bisacodyl  10 mg Rectal Daily   cephALEXin  500 mg Oral Q8H   furosemide  40 mg Oral Daily   hydrocortisone   Rectal BID   mouth rinse  15 mL Mouth Rinse BID   metoprolol tartrate  25 mg Oral TID   pantoprazole  40 mg Oral Daily   potassium chloride  20 mEq Oral Daily   sodium chloride flush  3 mL Intravenous Q12H   Continuous Infusions:   PRN Meds:.acetaminophen, alum & mag hydroxide-simeth, lactulose, magnesium hydroxide, metoprolol tartrate, mometasone-formoterol, ondansetron (ZOFRAN) IV, oxyCODONE, traMADol, zolpidem  Xrays DG Chest 2 View  Result Date: 05/20/2021 CLINICAL DATA:  56 year old male with history of cardiac surgery. EXAM: CHEST - 2 VIEW COMPARISON:  Chest x-ray 05/13/2021. FINDINGS: Lung volumes are low. Bibasilar opacities favored to reflect areas of subsegmental atelectasis. Small bilateral pleural effusions. No pneumothorax. No evidence of pulmonary edema. Heart size is normal. Upper mediastinal contours are within normal limits. Atherosclerotic calcifications are noted in the thoracic aorta. IMPRESSION: 1. Bibasilar subsegmental atelectasis and small bilateral pleural  effusions. Electronically Signed   By: Vinnie Langton M.D.   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 Had rapid AF and some VT earlier post-op.  Now primarily SR with PAF and controlled VR. On amio, BB and Eliquis per cardiology.   -Volume excess-  good diuresis yesterday, edema improved.  -Phlebitis right hand- improving. Continue  oral Keflex.  -Expected acute blood loss anemia- mild, no new lab today.  -Disposition-plan for discharge today if OK with cardiology.   Shawn Odea PA-C Pager [phone] 05/21/2021   12:19pm Addendum- Plan to keep in the hospital today for additional diuresis with IV Lasix per cardiology recs.  Shawn Suarez   Chart reviewed, patient examined, agree with above. Cardiology wants to diurese more today and plan home tomorrow.

## 2021-05-21 NOTE — Progress Notes (Signed)
Progress Note  Patient Name: Shawn Suarez Date of Encounter: 05/21/2021  [facility] Cardiologist: [name], MD   Subjective   Edema improved but still has a significant amount on LE (R>L).   Inpatient Medications    Scheduled Meds:  amiodarone  400 mg Oral BID   apixaban  5 mg Oral BID   aspirin EC  81 mg Oral Daily   bisacodyl  10 mg Oral Daily   Or   bisacodyl  10 mg Rectal Daily   cephALEXin  500 mg Oral Q8H   furosemide  40 mg Intravenous BID   furosemide  40 mg Oral Daily   hydrocortisone   Rectal BID   mouth rinse  15 mL Mouth Rinse BID   metoprolol tartrate  25 mg Oral TID   pantoprazole  40 mg Oral Daily   potassium chloride  20 mEq Oral Daily   sodium chloride flush  3 mL Intravenous Q12H   Continuous Infusions:  PRN Meds: acetaminophen, alum & mag hydroxide-simeth, lactulose, magnesium hydroxide, metoprolol tartrate, mometasone-formoterol, ondansetron (ZOFRAN) IV, oxyCODONE, traMADol, zolpidem   Vital Signs    Vitals:   05/20/21 2005 05/21/21 0020 05/21/21 0518 05/21/21 0745  BP: 95/67 (!) 97/59 117/76 113/65  Pulse: 81 60 (!) 108 71  Resp: 18 18 20 20   Temp: 98.2 F (36.8 C) 98.2 F (36.8 C) 97.9 F (36.6 C) 98.3 F (36.8 C)  TempSrc: Oral Oral Oral Oral  SpO2: 93% 100% 96% 96%  Weight:   116 kg   Height:        Intake/Output Summary (Last 24 hours) at 05/21/2021 0944 Last data filed at 05/21/2021 0200 Gross per 24 hour  Intake 240 ml  Output 2751 ml  Net -2511 ml    Last 3 Weights 05/21/2021 05/20/2021 05/19/2021  Weight (lbs) 255 lb 11.2 oz 260 lb 3.2 oz 261 lb 1.6 oz  Weight (kg) 115.985 kg 118.026 kg 118.434 kg      Telemetry    Personally Reviewed  ECG      Physical Exam   GEN: No acute distress.   Neck: No JVD Cardiac: RRR, no murmurs, rubs, or gallops.  Respiratory: Clear to auscultation bilaterally. GI: Soft, nontender, non-distended  MS: 2+ pitting (R>L) bilateral LE edema. Neuro:  Nonfocal  Psych: Normal  affect   Labs    High Sensitivity Troponin:  No results for input(s): TROPONINIHS in the last 720 hours.   Chemistry Recent Labs  Lab 05/16/21 0120 05/18/21 0150 05/20/21 0602  NA 139 136 137  K 4.0 3.7 4.5  CL 100 102 105  CO2 27 22 22   GLUCOSE 98 109* 114*  BUN 24* 20 23*  CREATININE 1.07 1.14 1.24  CALCIUM 8.9 8.7* 9.3  MG 2.4 2.1  --   GFRNONAA >60 >60 >60  ANIONGAP 12 12 10      Lipids No results for input(s): CHOL, TRIG, HDL, LABVLDL, LDLCALC, CHOLHDL in the last 168 hours.  Hematology Recent Labs  Lab 05/15/21 0721 05/20/21 0602  WBC 11.3* 9.5  RBC 2.89* 3.17*  HGB 8.2* 9.0*  HCT 25.6* 29.0*  MCV 88.6 91.5  MCH 28.4 28.4  MCHC 32.0 31.0  RDW 14.9 15.6*  PLT 252 459*    Thyroid No results for input(s): TSH, FREET4 in the last 168 hours.  BNPNo results for input(s): BNP, PROBNP in the last 168 hours.  DDimer No results for input(s): DDIMER in the last 168 hours.   Radiology  DG Chest 2 View  Result Date: 05/20/2021 CLINICAL DATA:  56 year old male with history of cardiac surgery. EXAM: CHEST - 2 VIEW COMPARISON:  Chest x-ray 05/13/2021. FINDINGS: Lung volumes are low. Bibasilar opacities favored to reflect areas of subsegmental atelectasis. Small bilateral pleural effusions. No pneumothorax. No evidence of pulmonary edema. Heart size is normal. Upper mediastinal contours are within normal limits. Atherosclerotic calcifications are noted in the thoracic aorta. IMPRESSION: 1. Bibasilar subsegmental atelectasis and small bilateral pleural effusions. Electronically Signed   By: Vinnie Langton M.D.   On: 05/20/2021 08:42    Cardiac Studies   Normal LV function by prior echo  Patient Profile     56 y.o. male with myxoma removal, subsequent perioperative atrial fibrillation  Assessment & Plan    Atrial fibrillation: Continue oral amiodarone to maintain rhythm.  Eliquis for stroke prevention.  He did report some hemorrhoidal bleeding.  Anusol was  ordered.  Volume overload improved: Continue diuresis. Give additional IV dose this afternoon. Elevate legs. Likely home tomorrow.     For questions or updates, please contact [place] Please consult [email or web address] for contact info under        Signed, Vickie Epley, MD  05/21/2021, 9:44 AM

## 2021-05-22 ENCOUNTER — Other Ambulatory Visit (HOSPITAL_COMMUNITY): Payer: Self-pay

## 2021-05-22 MED ORDER — METOPROLOL TARTRATE 25 MG PO TABS
25.0000 mg | ORAL_TABLET | Freq: Three times a day (TID) | ORAL | 1 refills | Status: DC
  Filled 2021-05-22: qty 90, 30d supply, fill #0

## 2021-05-22 MED ORDER — ACETAMINOPHEN 325 MG PO TABS: 650.0000 mg | ORAL_TABLET | Freq: Four times a day (QID) | ORAL | Status: DC | PRN

## 2021-05-22 MED ORDER — APIXABAN 5 MG PO TABS
5.0000 mg | ORAL_TABLET | Freq: Two times a day (BID) | ORAL | 1 refills | Status: DC
  Filled 2021-05-22: qty 60, 30d supply, fill #0

## 2021-05-22 MED ORDER — POTASSIUM CHLORIDE CRYS ER 20 MEQ PO TBCR
20.0000 meq | EXTENDED_RELEASE_TABLET | Freq: Every day | ORAL | 0 refills | Status: DC
  Filled 2021-05-22: qty 30, 30d supply, fill #0

## 2021-05-22 MED ORDER — TRAMADOL HCL 50 MG PO TABS
50.0000 mg | ORAL_TABLET | Freq: Four times a day (QID) | ORAL | 0 refills | Status: DC | PRN
  Filled 2021-05-22: qty 20, 5d supply, fill #0

## 2021-05-22 MED ORDER — FUROSEMIDE 40 MG PO TABS
40.0000 mg | ORAL_TABLET | Freq: Every day | ORAL | 0 refills | Status: DC
  Filled 2021-05-22: qty 30, 30d supply, fill #0

## 2021-05-22 MED ORDER — AMIODARONE HCL 200 MG PO TABS
200.0000 mg | ORAL_TABLET | Freq: Two times a day (BID) | ORAL | 1 refills | Status: DC
  Filled 2021-05-22: qty 60, 30d supply, fill #0

## 2021-05-22 MED ORDER — CEPHALEXIN 500 MG PO CAPS
500.0000 mg | ORAL_CAPSULE | Freq: Three times a day (TID) | ORAL | 0 refills | Status: DC
  Filled 2021-05-22: qty 5, 2d supply, fill #0

## 2021-05-22 NOTE — TOC Transition Note (Addendum)
Transition of Care (TOC) - CM/SW Discharge Note Marvetta Gibbons RN, BSN Transitions of Care Unit 4E- RN Case Manager See Treatment Team for direct phone #    Patient Details  Name: Shawn Suarez MRN: [id number] Date of Birth: [birth year]  Transition of [facility]) CM/SW Contact:  Dawayne Patricia, RN Phone Number: 05/22/2021, 10:35 AM   Clinical Narrative:    Pt stable for transition home today, DME ordered, pt spoke with [employer] and elected to just get 3n1 at this time- DME has been arranged between [employer] and pt on 2/10 per [employer] and delivered to room.  Wife to transport home.  [place] pharmacy to fill meds prior to discharge.   No further TOC needs noted.    Final next level of care: Home/Self Care Barriers to Discharge: No Barriers Identified   Patient Goals and CMS Choice Patient states their goals for this hospitalization and ongoing recovery are:: return home   Choice offered to / list presented to : Patient  Discharge Placement               Home        Discharge Plan and Services   Discharge Planning Services: CM Consult Post Acute Care Choice: Durable Medical Equipment          DME Arranged: 3-N-1, [place] rolling DME Agency: [employer] Date DME Agency Contacted: 05/19/21 Time DME Agency Contacted: 89 Representative spoke with at DME Agency: Thedore Mins HH Arranged: NA [place] Agency: NA        Social Determinants of Health ([place]) Interventions     Readmission Risk Interventions Readmission Risk Prevention Plan 05/22/2021  Post Dischage Appt Complete  Medication Screening Complete  Transportation Screening Complete  Some recent data might be hidden

## 2021-05-22 NOTE — Progress Notes (Addendum)
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 He states, "I can make a fist with my right hand and I have knuckles". No more hemorrhoidal bleeding.  Objective: Vital signs in last 24 hours: Temp:  [98 F (36.7 C)-98.4 F (36.9 C)] 98.4 F (36.9 C) (02/13 0424) Pulse Rate:  [65-83] 71 (02/13 0424) Cardiac Rhythm: Normal sinus rhythm;Atrial fibrillation (02/12 1942) Resp:  [15-20] 19 (02/13 0424) BP: (93-118)/(52-74) 98/60 (02/13 0424) SpO2:  [95 %-98 %] 98 % (02/13 0424) Weight:  [114.9 kg] 114.9 kg (02/13 0424)  Pre op weight 117.9 kg Current Weight  05/22/21 114.9 kg       Intake/Output from previous day: 02/12 0701 - 02/13 0700 In: 476 [P.O.:476] Out: 1580 [Urine:1580]   Physical Exam:  Cardiovascular: RRR Pulmonary:Clear to auscultation bilaterally Abdomen: Soft, non tender, bowel sounds present. Extremities: Mild bilateral lower extremity edema. Right hand with swelling;erythema resolved. Mild phlebitis secondary to infiltrated IV.  Wounds: Sternal, right anterior wounds are clean and dry.  No erythema or signs of infection. Chest tube wound with superficial skin dehiscence;no drainage.  Lab Results: CBC: Recent Labs    05/20/21 0602  WBC 9.5  HGB 9.0*  HCT 29.0*  PLT 459*    BMET:  Recent Labs    05/20/21 0602 05/21/21 0955  NA 137 136  K 4.5 4.3  CL 105 101  CO2 22 25  GLUCOSE 114* 136*  BUN 23* 21*  CREATININE 1.24 1.35*  CALCIUM 9.3 9.1     PT/INR:  Lab Results  Component Value Date   INR 1.4 (H) 05/10/2021   INR 0.9 05/08/2021   ABG:  INR: Will add last result for INR, ABG once components are confirmed Will add  last 4 CBG results once components are confirmed  Assessment/Plan:  1. CV - History of PAF. S/p pulmonary vein isolation. In SR, (PVCs yesterday) this am. On Amiodarone 400 mg bid, Lopressor 25 mg tid,Apixaban 5 mg bid. Cardiology following. 2.  Pulmonary - On room air. Encourage incentive spirometer. 3. Volume Overload - On Lasix 40 mg PO daily 4.  Expected post op acute blood loss anemia - Last H and H 9 and 29. 5. Phlebitis posterior right hand-on Keflex 6. Creatinine yesterday slightly increased to 1.35.Related to diuresis 7. Patient concerned he has OSA. Will need sleep study as an outpatient. 8. Await cardiology evaluation;hopefully, home today  Sharalyn Ink ZimmermanPA-C 05/22/2021,6:59 AM  Dc home today Will check with Cards on amiodarone dose Script for Tramadol   Remo Lipps C. Roxan Hockey, MD [facility] [phone]

## 2021-05-22 NOTE — Progress Notes (Signed)
Order received to discharge patient.  Telemetry monitor removed and CCMD notified.  PIV access removed.  TOC meds and home equipment delivered to patient's room.  Discharge instructions, follow up, medications and instructions for their use discussed with patient.

## 2021-05-22 NOTE — Progress Notes (Signed)
Chest tube suture removed per MD order without difficulty.

## 2021-05-22 NOTE — Progress Notes (Signed)
CARDIAC REHAB PHASE I   Pt with d/c orders. Pt denies questions or concerns. Encouraged continued IS [place], walks, and sternal precautions. Pt referred CRP II [place].  [phone] Rufina Falco, RN BSN 05/22/2021 9:23 AM

## 2021-05-23 LAB — BPAM RBC
Blood Product Expiration Date: 202302202359
Blood Product Expiration Date: 202302202359
Blood Product Expiration Date: 202302252359
Blood Product Expiration Date: 202303012359
ISSUE DATE / TIME: 202302011033
ISSUE DATE / TIME: 202302011033
ISSUE DATE / TIME: 202302032253
ISSUE DATE / TIME: 202302032253
Unit Type and Rh: 5100
Unit Type and Rh: 5100
Unit Type and Rh: 5100
Unit Type and Rh: 5100

## 2021-05-23 LAB — TYPE AND SCREEN
ABO/RH(D): O POS
Antibody Screen: NEGATIVE
Unit division: 0
Unit division: 0
Unit division: 0
Unit division: 0

## 2021-05-23 NOTE — Progress Notes (Signed)
Cardiology Office Note    Date:  05/31/2021   ID:  Shawn Suarez, DOB 1965-07-17, MRN [id number]   PCP:  Antony Contras, MD   [place]  Cardiologist:  [name], MD   Advanced Practice Provider:  No care team member to display Electrophysiologist:  None   [id number]   Chief Complaint  Patient presents with   Hospitalization Follow-up    History of Present Illness:  Shawn Suarez is a 56 y.o. male who saw Dr. Marlou Porch 02/2021 for increased exertional fatigue and palpitations and underwent outpatient cardiac CT which showed minimal CAD but 25 to 30 mm possible myxoma in the left atrial septum.  Also wore outpatient ZIO monitor which showed 10 minutes of atrial fibrillation, PVCs, bigeminy, triplets and brief runs of nonsustained VT.  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 Was referred to [facility] for management of myxoma resection, maze and left atrial clipping.     Underwent successful excision of atrial myxoma as well as pulmonary vein isolation using cryotherapy 05/10/21. Post op developed atrial fibrillation and placed on IV amiodarone and then transitioned for 400mg  BID. Initially converted to SR but has intermittent break through episodes of Afib.  He was  started on Eliquis 5mg  BID. Was volume overloaded and diuresed. Amio continued. BB/CCB couldn't be titrated bc of low BP.   Patient comes in for hospital f/u. Walking 10 min 3 times daily. Rare occasional flutter very brief. Thinks he's maintaining NSR. Was 260 lbs when he left the hospital and now down to 239 lbs-feels better without fluid overload.  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   ZIO monitor 2022-symptomatic   GERD (gastroesophageal reflux disease)    Paroxysmal atrial fibrillation ([place]) 04/19/2021   ZIO monitor-2022- approximately 10 minutes atrial fibrillation rapid ventricular response    Past Surgical History:  Procedure Laterality Date   CHOLECYSTECTOMY     MEDIASTERNOTOMY N/A 05/10/2021   Procedure: MEDIAN STERNOTOMY;  Surgeon: Melrose Nakayama, MD;  Location: [place];  Service: Open Heart Surgery;  Laterality: N/A;   MINIMALLY INVASIVE EXCISION OF ATRIAL MYXOMA Right 05/10/2021   Procedure: MINIMALLY INVASIVE EXCISION OF ATRIAL MYXOMA;  Surgeon: Melrose Nakayama, MD;  Location: [place];  Service: Open Heart Surgery;  Laterality: Right;   MINIMALLY INVASIVE MAZE PROCEDURE N/A 05/10/2021   Procedure: PARTIAL CRYOMAZE PROCEDURE;  Surgeon: Melrose Nakayama, MD;  Location: [place];  Service: Open Heart Surgery;  Laterality: N/A;   TEE WITHOUT CARDIOVERSION  05/10/2021   Procedure: TRANSESOPHAGEAL ECHOCARDIOGRAM (TEE);  Surgeon: Melrose Nakayama, MD;  Location: [facility] OR;  Service: Open Heart Surgery;;    Current Medications: Current Meds  Medication Sig   acetaminophen (TYLENOL) 325 MG tablet Take 2 tablets (650 mg total) by mouth every 6 (six) hours as needed for headache, mild pain or fever.   amiodarone (PACERONE) 200 MG tablet Take 1 tablet (200 mg total) by mouth 2 (two) times daily. For 10 days then take 1 tablet (200 mg) daily thereafter   apixaban (ELIQUIS) 5 MG TABS tablet Take 1 tablet (5 mg total) by  mouth 2 (two) times daily.   aspirin EC 81 MG tablet Take 1 tablet (81 mg total) by mouth daily. Swallow whole.   metoprolol tartrate (LOPRESSOR) 25 MG tablet Take 1 tablet (25 mg total) by mouth 3 (three) times daily.   mometasone-formoterol (DULERA) 100-5 MCG/ACT AERO Inhale 2 puffs into the lungs 2 (two) times daily as needed (Asthma like symptoms).   pantoprazole  (PROTONIX) 20 MG tablet Take 20 mg by mouth daily.   traMADol (ULTRAM) 50 MG tablet Take 1 tablet (50 mg total) by mouth every 6 (six) hours as needed for moderate pain.   [DISCONTINUED] furosemide (LASIX) 40 MG tablet Take 1 tablet (40 mg total) by mouth daily.   [DISCONTINUED] potassium chloride SA (KLOR-CON M) 20 MEQ tablet Take 1 tablet (20 mEq total) by mouth daily.     Allergies:   Patient has no known allergies.   Social History   Socioeconomic History   Marital status: Married    Spouse name: Not on file   Number of children: Not on file   Years of education: Not on file   Highest education level: Not on file  Occupational History   Not on file  Tobacco Use   Smoking status: Never   Smokeless tobacco: Current   Tobacco comments:    1 can per 1.5 weeks  Vaping Use   Vaping Use: Never used  Substance and Sexual Activity   Alcohol use: Yes    Comment: rare   Drug use: Never   Sexual activity: Not on file  Other Topics Concern   Not on file  Social History Narrative   Not on file   Social Determinants of Health   Financial Resource Strain: Not on file  Food Insecurity: Not on file  Transportation Needs: Not on file  Physical Activity: Not on file  Stress: Not on file  Social Connections: Not on file     Family History:  The patient's  family history includes Diabetes in his father; Hypertension in his mother.   ROS:   Please see the history of present illness.    ROS All other systems reviewed and are negative.   PHYSICAL EXAM:   VS:  BP 112/70    Pulse 74    Ht 5\' 9"  (1.753 m)    Wt 239 lb 9.6 oz (108.7 kg)    SpO2 97%    BMI 35.38 kg/m   Physical Exam  GEN: Obese, in no acute distress  Neck: no JVD, carotid bruits, or masses Cardiac:incision healing well, RRR; no murmurs, rubs, or gallops  Respiratory:  clear to auscultation bilaterally, normal work of breathing GI: soft, nontender, nondistended, + BS Ext: without cyanosis, clubbing, or edema, Good  distal pulses bilaterally Neuro:  Alert and Oriented x 3 Psych: euthymic mood, full affect  Wt Readings from Last 3 Encounters:  05/31/21 239 lb 9.6 oz (108.7 kg)  05/22/21 253 lb 4.8 oz (114.9 kg)  05/08/21 259 lb 14.4 oz (117.9 kg)      Studies/Labs Reviewed:   EKG:  EKG is not ordered today.     Recent Labs: 05/08/2021: ALT 53 05/18/2021: Magnesium 2.1 05/20/2021: Hemoglobin 9.0; Platelets 459 05/21/2021: BUN 21; Creatinine, Ser 1.35; Potassium 4.3; Sodium 136   Lipid Panel No results found for: CHOL, TRIG, HDL, CHOLHDL, VLDL, LDLCALC, LDLDIRECT  Additional studies/ records that were reviewed today include:    Echo: 03/10/2021   IMPRESSIONS     1. Left ventricular ejection fraction, by estimation,  is 60 to 65%. The  left ventricle has normal function. The left ventricle has no regional  wall motion abnormalities. There is mild left ventricular hypertrophy.  Left ventricular diastolic parameters  were normal.   2. Right ventricular systolic function is normal. The right ventricular  size is normal. Tricuspid regurgitation signal is inadequate for assessing  PA pressure.   3. The mitral valve is normal in structure. Trivial mitral valve  regurgitation. No evidence of mitral stenosis.   4. The aortic valve is grossly normal. Aortic valve regurgitation is not  visualized. No aortic stenosis is present.   5. The inferior vena cava is normal in size with greater than 50%  respiratory variability, suggesting right atrial pressure of 3 mmHg.   FINDINGS   Left Ventricle: Left ventricular ejection fraction, by estimation, is 60  to 65%. The left ventricle has normal function. The left ventricle has no  regional wall motion abnormalities. Definity contrast agent was given IV  to delineate the left ventricular   endocardial borders. The left ventricular internal cavity size was normal  in size. There is mild left ventricular hypertrophy. Left ventricular  diastolic parameters  were normal.   Right Ventricle: The right ventricular size is normal. No increase in  right ventricular wall thickness. Right ventricular systolic function is  normal. Tricuspid regurgitation signal is inadequate for assessing PA  pressure.   Left Atrium: Left atrial size was normal in size.   Right Atrium: Right atrial size was normal in size.   Pericardium: There is no evidence of pericardial effusion.   Mitral Valve: The mitral valve is normal in structure. Trivial mitral  valve regurgitation. No evidence of mitral valve stenosis.   Tricuspid Valve: The tricuspid valve is normal in structure. Tricuspid  valve regurgitation is not demonstrated. No evidence of tricuspid  stenosis.   Aortic Valve: The aortic valve is grossly normal. Aortic valve  regurgitation is not visualized. No aortic stenosis is present.   Pulmonic Valve: The pulmonic valve was normal in structure. Pulmonic valve  regurgitation is not visualized. No evidence of pulmonic stenosis.   Aorta: The aortic root is normal in size and structure.   Venous: The inferior vena cava is normal in size with greater than 50%  respiratory variability, suggesting right atrial pressure of 3 mmHg.   IAS/Shunts: No atrial level shunt detected by color flow Doppler.     Risk Assessment/Calculations:    CHA2DS2-VASc Score = 1   This indicates a 0.6% annual risk of stroke. The patient's score is based upon: CHF History: 0 HTN History: 0 Diabetes History: 0 Stroke History: 0 Vascular Disease History: 1 Age Score: 0 Gender Score: 0         ASSESSMENT:    1. Atrial myxoma   2. Paroxysmal atrial fibrillation (HCC)   3. Edema, unspecified type   4. Morbid obesity ([place])      PLAN:  In order of problems listed above:  Atrial myxoma excision and pulmonary vein isolation using cryotherapy 05/10/21 doing well and exercising.  PAF-preop and post op on Amiodarone and eliquis-no bleeding problems. HR regular. To reduce  amio 200 mg daily on Frid.  Fluid overload on diuretic-has lost 21 lbs on lasix. Crt 1.35 at discharge. Will check labs today. Seems to be at baseline. Stop lasix & K-can take as needed for weight gain 2-3 lbs overnight.   Obesity -on strict low carb diet and calorie restriction, very committed to weight loss.  Shared Decision Making/Informed  Consent        Medication Adjustments/Labs and Tests Ordered: Current medicines are reviewed at length with the patient today.  Concerns regarding medicines are outlined above.  Medication changes, Labs and Tests ordered today are listed in the Patient Instructions below. Patient Instructions  Medication Instructions:  Your physician has recommended you make the following change in your medication:   DISCONTINUE: Potassium CHANGE: Furosemide (lasix) only take as needed  *If you need a refill on your cardiac medications before your next appointment, please call your pharmacy*   Lab Work: TODAY: BMET  If you have labs (blood work) drawn today and your tests are completely normal, you will receive your results only by: MyChart Message (if you have MyChart) OR A paper copy in the mail If you have any lab test that is abnormal or we need to change your treatment, we will call you to review the results.   Follow-Up: At [facility], you and your health needs are our priority.  As part of our continuing mission to provide you with exceptional heart care, we have created designated Provider Care Teams.  These Care Teams include your primary Cardiologist (physician) and Advanced Practice Providers (APPs -  Physician Assistants and Nurse Practitioners) who all work together to provide you with the care you need, when you need it.   Your next appointment:   2 month(s)  The format for your next appointment:   In Person  Provider:   [name], MD       Signed, Ermalinda Barrios, PA-C  05/31/2021 10:35 AM    [facility] [address], [place], [place]  [address] Phone: [phone]; Fax: [phone]

## 2021-05-29 ENCOUNTER — Other Ambulatory Visit (HOSPITAL_COMMUNITY): Payer: Self-pay

## 2021-05-29 ENCOUNTER — Telehealth (HOSPITAL_COMMUNITY): Payer: Self-pay

## 2021-05-29 NOTE — Telephone Encounter (Signed)
Pharmacy Transitions of Care Follow-up Telephone Call  Date of discharge: 05/22/2021  Discharge Diagnosis: PAF  How have you been since you were released from the hospital? Patient reports doing well, no questions or concerns at this time    Medication changes made at discharge:  - START: Eliquis 5 mg PO BID  Medication changes verified by the patient? Yes (Yes/No)   Medication Accessibility:  Home Pharmacy: [place]   Was the patient provided with refills on discharged medications? Yes   Have all prescriptions been transferred from [facility] to home pharmacy? Yes   Is the patient able to afford medications? Patient has insurance  Medication Review:   APIXABAN (ELIQUIS)  Apixaban 5 mg BID  - Discussed importance of taking medication around the same time everyday  - Advised patient of medications to avoid (NSAIDs, ASA)  - Educated that Tylenol (acetaminophen) will be the preferred analgesic to prevent risk of bleeding  - Emphasized importance of monitoring for signs and symptoms of bleeding (abnormal bruising, prolonged bleeding, nose bleeds, bleeding from gums, discolored urine, black tarry stools)  - Advised patient to alert all providers of anticoagulation therapy prior to starting a new medication or having a procedure   Follow-up Appointments: [facility] f/u appt confirmed? Cardiology Scheduled to see Lindaann Slough on 05/31/2021 @ 9:45.   If their condition worsens, is the pt aware to call PCP or go to the Emergency Dept.? Yes  Final Patient Assessment: Patient reports tolerating Eliquis well. He states providers went over side effects and monitoring parameters for Eliquis. Currently uses [facility] where I will transfer his 3 active prescriptions. No other questions at this time.

## 2021-05-31 ENCOUNTER — Encounter: Payer: Self-pay | Admitting: Physician Assistant

## 2021-05-31 ENCOUNTER — Other Ambulatory Visit: Payer: Self-pay

## 2021-05-31 ENCOUNTER — Ambulatory Visit: Payer: Federal, State, Local not specified - PPO | Admitting: Physician Assistant

## 2021-05-31 VITALS — BP 112/70 | HR 74 | Ht 69.0 in | Wt 239.6 lb

## 2021-05-31 DIAGNOSIS — R609 Edema, unspecified: Secondary | ICD-10-CM | POA: Diagnosis not present

## 2021-05-31 DIAGNOSIS — D151 Benign neoplasm of heart: Secondary | ICD-10-CM

## 2021-05-31 DIAGNOSIS — I480 Paroxysmal atrial fibrillation: Secondary | ICD-10-CM

## 2021-05-31 LAB — BASIC METABOLIC PANEL
BUN/Creatinine Ratio: 10 (ref 9–20)
BUN: 15 mg/dL (ref 6–24)
CO2: 24 mmol/L (ref 20–29)
Calcium: 10.3 mg/dL — ABNORMAL HIGH (ref 8.7–10.2)
Chloride: 99 mmol/L (ref 96–106)
Creatinine, Ser: 1.49 mg/dL — ABNORMAL HIGH (ref 0.76–1.27)
Glucose: 111 mg/dL — ABNORMAL HIGH (ref 70–99)
Potassium: 5 mmol/L (ref 3.5–5.2)
Sodium: 138 mmol/L (ref 134–144)
eGFR: 54 mL/min/{1.73_m2} — ABNORMAL LOW (ref >59)

## 2021-05-31 MED ORDER — FUROSEMIDE 40 MG PO TABS: 40.0000 mg | ORAL_TABLET | ORAL | 0 refills | Status: DC | PRN

## 2021-05-31 NOTE — Patient Instructions (Signed)
Medication Instructions:  Your physician has recommended you make the following change in your medication:   DISCONTINUE: Potassium CHANGE: Furosemide (lasix) only take as needed  *If you need a refill on your cardiac medications before your next appointment, please call your pharmacy*   Lab Work: TODAY: BMET  If you have labs (blood work) drawn today and your tests are completely normal, you will receive your results only by: MyChart Message (if you have MyChart) OR A paper copy in the mail If you have any lab test that is abnormal or we need to change your treatment, we will call you to review the results.   Follow-Up: At [facility], you and your health needs are our priority.  As part of our continuing mission to provide you with exceptional heart care, we have created designated Provider Care Teams.  These Care Teams include your primary Cardiologist (physician) and Advanced Practice Providers (APPs -  Physician Assistants and Nurse Practitioners) who all work together to provide you with the care you need, when you need it.   Your next appointment:   2 month(s)  The format for your next appointment:   In Person  Provider:   [name], MD

## 2021-06-01 ENCOUNTER — Telehealth (HOSPITAL_COMMUNITY): Payer: Self-pay

## 2021-06-01 DIAGNOSIS — Z79899 Other long term (current) drug therapy: Secondary | ICD-10-CM

## 2021-06-01 DIAGNOSIS — R7989 Other specified abnormal findings of blood chemistry: Secondary | ICD-10-CM

## 2021-06-01 DIAGNOSIS — N289 Disorder of kidney and ureter, unspecified: Secondary | ICD-10-CM

## 2021-06-01 NOTE — Telephone Encounter (Signed)
Imogene Burn, PA-C  06/01/2021  8:13 AM EST Back to Top    Kidney function up high than hospital as suspected. I stopped lasix and K yest. Can take as needed.  Repeat bmet in 2 weeks. thanks    Pt made aware of lab results and recommendations per Ermalinda Barrios PA-C, as indicated above.  Made the pt a lab appt to recheck a BMET in 2 weeks on 06/15/21. Pt verbalized understanding and agrees with this plan.

## 2021-06-01 NOTE — Telephone Encounter (Signed)
Called patient to see if he is interested in the Cardiac Rehab Program. Patient expressed interest. Explained scheduling process and went over insurance, patient verbalized understanding. Will contact patient for scheduling once f/u has been completed.  °

## 2021-06-01 NOTE — Telephone Encounter (Signed)
Pt insurance is active and benefits verified through [employer]. Co-pay $0.00, DED $350.00/$350.00 met, out of pocket $6,000.00/$103.01 met, co-insurance 35%. No pre-authorization required. Passport, 06/01/21 @ 2PM, [facility]#[id number]   Will contact patient to see if he is interested in the Cardiac Rehab Program. If interested, patient will need to complete follow up appt. Once completed, patient will be contacted for scheduling upon review by the RN Navigator.

## 2021-06-12 ENCOUNTER — Other Ambulatory Visit: Payer: Self-pay | Admitting: Thoracic Surgery (Cardiothoracic Vascular Surgery)

## 2021-06-12 DIAGNOSIS — I5189 Other ill-defined heart diseases: Secondary | ICD-10-CM

## 2021-06-13 ENCOUNTER — Other Ambulatory Visit: Payer: Self-pay

## 2021-06-13 ENCOUNTER — Ambulatory Visit
Admission: RE | Admit: 2021-06-13 | Discharge: 2021-06-13 | Disposition: A | Discharge status: Home / Self Care | Payer: Federal, State, Local not specified - PPO | Source: Ambulatory Visit | Attending: Thoracic Surgery (Cardiothoracic Vascular Surgery) | Admitting: Thoracic Surgery (Cardiothoracic Vascular Surgery)

## 2021-06-13 ENCOUNTER — Ambulatory Visit (INDEPENDENT_AMBULATORY_CARE_PROVIDER_SITE_OTHER): Payer: Self-pay | Admitting: Thoracic Surgery (Cardiothoracic Vascular Surgery)

## 2021-06-13 ENCOUNTER — Encounter: Payer: Self-pay | Admitting: Thoracic Surgery (Cardiothoracic Vascular Surgery)

## 2021-06-13 VITALS — BP 119/82 | HR 70 | Resp 20 | Ht 69.0 in | Wt 237.0 lb

## 2021-06-13 DIAGNOSIS — I5189 Other ill-defined heart diseases: Secondary | ICD-10-CM

## 2021-06-13 DIAGNOSIS — Z09 Encounter for follow-up examination after completed treatment for conditions other than malignant neoplasm: Secondary | ICD-10-CM

## 2021-06-13 DIAGNOSIS — R918 Other nonspecific abnormal finding of lung field: Secondary | ICD-10-CM | POA: Diagnosis not present

## 2021-06-13 NOTE — Progress Notes (Signed)
? ?   ?[address] ?      [place] [address] ?            [phone]   ? ? ?HPI: Shawn Suarez returns for a scheduled follow-up visit ? ?Shawn Suarez is a 56 year old man with a history of paroxysmal atrial fibrillation, reflux, prostate cancer, obesity, and posttraumatic stress disorder.  He presented with palpitations and shortness of breath.  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 ? ?He underwent a resection of the left atrial myxoma and pulmonary vein isolation with cryoablation on 05/10/2021.  We initially were going to attempt a thoracotomy approach but there was complications with cannulating his right common femoral artery.  That necessitated changing to a sternotomy. ? ?Postoperatively he had issues with atrial fibrillation and volume retention.  He finally went home on day 12. ? ?He saw Estella Husk on  05/31/2021.  A BMET showed an increase in his creatinine.  She stopped his Lasix and potassium.  He has not noticed any weight gain since then.  He is lost about 35 pounds since surgery. ? ?He has some discomfort but is not taking any narcotics.  He is walking about 40 minutes a day.  He will have an occasional unusual heartbeat but infrequent and usually only last for a second or 2. ? ?Past Medical History:  ?Diagnosis Date  ? 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 Detected on coronary CT scan/MRI 2022  ? Cancer [facility])   ? 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 Coronary CT-2022- no flow-limiting disease, calcium score 33, 60th percentile (minimal LAD calcified plaque)  ? Dysrhythmia   ? 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 ZIO monitor 2022-symptomatic  ? GERD (gastroesophageal reflux disease)   ? Paroxysmal atrial fibrillation ([place]) 04/19/2021  ? ZIO monitor-2022- approximately 10 minutes atrial fibrillation rapid ventricular response  ? ? ?Current Outpatient Medications  ?Medication Sig Dispense Refill  ?  acetaminophen (TYLENOL) 325 MG tablet Take 2 tablets (650 mg total) by mouth every 6 (six) hours as needed for headache, mild pain or fever.    ? amiodarone (PACERONE) 200 MG tablet Take 1 tablet (200 mg total) by mouth 2 (two) times daily. For 10 days then take 1 tablet (200 mg) daily thereafter 60 tablet 1  ? apixaban (ELIQUIS) 5 MG TABS tablet Take 1 tablet (5 mg total) by mouth 2 (two) times daily. 60 tablet 1  ? aspirin EC 81 MG tablet Take 1 tablet (81 mg total) by mouth daily. Swallow whole. 90 tablet 3  ? metoprolol tartrate (LOPRESSOR) 25 MG tablet Take 1 tablet (25 mg total) by mouth 3 (three) times daily. 90 tablet 1  ? mometasone-formoterol (DULERA) 100-5 MCG/ACT AERO Inhale 2 puffs into the lungs 2 (two) times daily as needed (Asthma like symptoms).    ? pantoprazole (PROTONIX) 20 MG tablet Take 20 mg by mouth daily.    ? traMADol (ULTRAM) 50 MG tablet Take 1 tablet (50 mg total) by mouth every 6 (six) hours as needed for moderate pain. 20 tablet 0  ? furosemide (LASIX) 40 MG tablet Take 1 tablet (40 mg total) by mouth as needed. 30 tablet 0  ? ?No current facility-administered medications for this visit.  ? ? ?Physical Exam ?BP 119/82   Pulse 70   Resp 20   Ht '5\' 9"'$  (1.753 m)   Wt 237 lb (107.5 kg)   SpO2 95%  Comment: RA  BMI 35.00 kg/m?  ?56 year old man in no acute distress ?Alert and oriented x3 with no focal deficits ?Lungs clear bilaterally ?Cardiac regular rate and rhythm ?Sternum stable, incision clean dry and intact ?Anterior thoracotomy incision healing well ?Groin incision healing well, no peripheral edema ? ?Diagnostic Tests: ?I personally reviewed his chest x-ray.  Postoperative changes present.  No effusions or infiltrates. ? ?Impression: ?Shawn Suarez is a 56 year old man with a history of paroxysmal atrial fibrillation, reflux, prostate cancer, obesity, and posttraumatic stress disorder, who presented with palpitations and shortness of breath.  He was found to have a 2-1/2 cm left  atrial myxoma. ? ?He underwent resection of the atrial myxoma on 05/10/2020.  His postoperative course was slowed down by atrial fibrillation and volume retention.  He finally diuresed and went home on postoperative day #12. ? ?Since discharge he has had gradual improvement.  He has some discomfort but is not having to take any narcotics for pain.  He is walking 40 minutes daily. ? ?Will defer to cardiology regarding length of treatment with amiodarone and Eliquis. ? ?He may begin driving.  Appropriate precautions were discussed.  Should not lift anything over 10 pounds for another week and nothing over 20 pounds for 3 weeks. ? ?He tentatively plans to return to work at [employer] duty in mid April.  He should be fine for that. ? ?Plan: ?Follow-up with Dr. Marlou Porch as scheduled ?Return in 2 months with PA and lateral chest x-ray to check on progress ? ?Melrose Nakayama, MD ?[facility] ?([phone] ? ? ? ?

## 2021-06-13 NOTE — Progress Notes (Signed)
Referral placed per Dr. Roxan Hockey for Cardiac Rehab-outpatient. ?

## 2021-06-15 ENCOUNTER — Other Ambulatory Visit: Payer: Self-pay

## 2021-06-15 ENCOUNTER — Other Ambulatory Visit: Payer: Federal, State, Local not specified - PPO

## 2021-06-15 DIAGNOSIS — N289 Disorder of kidney and ureter, unspecified: Secondary | ICD-10-CM

## 2021-06-15 DIAGNOSIS — R7989 Other specified abnormal findings of blood chemistry: Secondary | ICD-10-CM

## 2021-06-15 DIAGNOSIS — Z79899 Other long term (current) drug therapy: Secondary | ICD-10-CM | POA: Diagnosis not present

## 2021-06-16 NOTE — Progress Notes (Signed)
Pt has been made aware of normal result and verbalized understanding.  jw

## 2021-06-25 LAB — BASIC METABOLIC PANEL
BUN/Creatinine Ratio: 11 (ref 9–20)
BUN: 12 mg/dL (ref 6–24)
Calcium: 9.7 mg/dL (ref 8.7–10.2)
Chloride: 102 mmol/L (ref 96–106)
Creatinine, Ser: 1.13 mg/dL (ref 0.76–1.27)
Glucose: 156 mg/dL — ABNORMAL HIGH (ref 70–99)
Potassium: 4.5 mmol/L (ref 3.5–5.2)
Sodium: 137 mmol/L (ref 134–144)
eGFR: 75 mL/min/{1.73_m2} (ref >59)

## 2021-06-26 ENCOUNTER — Encounter: Payer: Self-pay | Admitting: Cardiology

## 2021-07-08 ENCOUNTER — Other Ambulatory Visit: Payer: Self-pay | Admitting: Physician Assistant

## 2021-07-12 ENCOUNTER — Telehealth (HOSPITAL_COMMUNITY): Payer: Self-pay

## 2021-07-12 NOTE — Telephone Encounter (Signed)
Called patient to see if he was interested in participating in the Cardiac Rehab Program. Patient stated yes. Patient will come in for orientation on 07/20/21 @ 8AM and will attend the 7AM exercise class. ?  ?Tourist information centre manager.  ?

## 2021-07-13 ENCOUNTER — Other Ambulatory Visit: Payer: Self-pay | Admitting: Physician Assistant

## 2021-07-18 ENCOUNTER — Encounter: Payer: Self-pay | Admitting: Cardiology

## 2021-07-18 ENCOUNTER — Other Ambulatory Visit: Payer: Self-pay | Admitting: Pharmacist

## 2021-07-18 ENCOUNTER — Other Ambulatory Visit: Payer: Self-pay | Admitting: Physician Assistant

## 2021-07-18 MED ORDER — APIXABAN 5 MG PO TABS: 5.0000 mg | ORAL_TABLET | Freq: Two times a day (BID) | ORAL | 5 refills | Status: DC

## 2021-07-18 MED ORDER — METOPROLOL TARTRATE 25 MG PO TABS: 25.0000 mg | ORAL_TABLET | Freq: Three times a day (TID) | ORAL | 3 refills | Status: DC

## 2021-07-18 NOTE — Progress Notes (Signed)
Refill sent in

## 2021-07-18 NOTE — Telephone Encounter (Signed)
Refilled pt's Metoprolol per request. Pt last seen by [facility] 02/28/21, then Estella Husk on 05/31/21 with instruction for 2 month follow-up which pt has scheduled for this upcoming May. Will route to PharmD to authorize Eliquis refill. ?

## 2021-07-19 ENCOUNTER — Telehealth (HOSPITAL_COMMUNITY): Payer: Self-pay

## 2021-07-20 ENCOUNTER — Encounter (HOSPITAL_COMMUNITY)
Admission: RE | Admit: 2021-07-20 | Discharge: 2021-07-20 | Disposition: A | Discharge status: Home / Self Care | Payer: Federal, State, Local not specified - PPO | Source: Ambulatory Visit | Attending: Cardiology | Admitting: Cardiology

## 2021-07-20 ENCOUNTER — Encounter (HOSPITAL_COMMUNITY): Payer: Self-pay

## 2021-07-20 VITALS — BP 104/70 | HR 69 | Ht 69.5 in | Wt 229.1 lb

## 2021-07-20 DIAGNOSIS — D151 Benign neoplasm of heart: Secondary | ICD-10-CM | POA: Insufficient documentation

## 2021-07-20 DIAGNOSIS — Z5189 Encounter for other specified aftercare: Secondary | ICD-10-CM | POA: Insufficient documentation

## 2021-07-20 NOTE — Progress Notes (Signed)
Cardiac Rehab Medication Review by a Nurse ? ?Does the patient  feel that his/her medications are working for him/her?  yes ? ?Has the patient been experiencing any side effects to the medications prescribed?  no ? ?Does the patient measure his/her own blood pressure or blood glucose at home?  yes  ? ?Does the patient have any problems obtaining medications due to transportation or finances?   no ? ?Understanding of regimen: excellent ?Understanding of indications: excellent ?Potential of compliance: excellent ? ? ? ?Nurse comments: Shawn Suarez is taking his medications and has a good understanding of what his medications are for. Shawn Suarez checks his blood pressures on a daily basis. ? ? ? ?Harrell Gave RN ?07/20/2021 8:38 AM ?  ?

## 2021-07-20 NOTE — Progress Notes (Signed)
Cardiac Individual Treatment Plan ? ?Patient Details  ?Name: Shawn Suarez ?MRN: [id number] ?Date of Birth: 09-Jun-1965 ?Referring Provider:   ?Flowsheet Row CARDIAC REHAB PHASE II ORIENTATION from 07/20/2021 in [place]  ?Referring Provider Dr. [name]  ? ?  ? ? ?Initial Encounter Date:  ?Flowsheet Row CARDIAC REHAB PHASE II ORIENTATION from 07/20/2021 in [place]  ?Date 07/20/21  ? ?  ? ? ?Visit Diagnosis: 05/10/21 Resection of Left Atrial Myxoma with cryoablation ? ?Patient's Home Medications on Admission: ? ?Current Outpatient Medications:  ?  acetaminophen (TYLENOL) 500 MG tablet, Take 1,000 mg by mouth every 6 (six) hours as needed for moderate pain., Disp: , Rfl:  ?  amiodarone (PACERONE) 200 MG tablet, Take 1 tablet (200 mg total) by mouth 2 (two) times daily. For 10 days then take 1 tablet (200 mg) daily thereafter (Patient taking differently: Take 200 mg by mouth daily.), Disp: 60 tablet, Rfl: 1 ?  aspirin EC 81 MG tablet, Take 1 tablet (81 mg total) by mouth daily. Swallow whole., Disp: 90 tablet, Rfl: 3 ?  mometasone-formoterol (DULERA) 100-5 MCG/ACT AERO, Inhale 2 puffs into the lungs 2 (two) times daily as needed (Asthma like symptoms)., Disp: , Rfl:  ?  pantoprazole (PROTONIX) 20 MG tablet, Take 20 mg by mouth See admin instructions. Take 20 mg daily, may take a second 20 mg dose as needed for heartburn, Disp: , Rfl:  ?  apixaban (ELIQUIS) 5 MG TABS tablet, Take 1 tablet (5 mg total) by mouth 2 (two) times daily., Disp: 60 tablet, Rfl: 5 ?  furosemide (LASIX) 40 MG tablet, Take 1 tablet (40 mg total) by mouth as needed. (Patient not taking: Reported on 07/17/2021), Disp: 30 tablet, Rfl: 0 ?  metoprolol tartrate (LOPRESSOR) 25 MG tablet, Take 1 tablet (25 mg total) by mouth 3 (three) times daily., Disp: 90 tablet, Rfl: 3 ?  traMADol (ULTRAM) 50 MG tablet, Take 1 tablet (50 mg total) by mouth every 6 (six) hours as needed for moderate pain.  (Patient not taking: Reported on 07/17/2021), Disp: 20 tablet, Rfl: 0 ? ?Past Medical History: ?Past Medical History:  ?Diagnosis Date  ? 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 Detected on coronary CT scan/MRI 2022  ? Cancer [facility])   ? 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 Coronary CT-2022- no flow-limiting disease, calcium score 33, 60th percentile (minimal LAD calcified plaque)  ? Dysrhythmia   ? 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 ZIO monitor 2022-symptomatic  ? GERD (gastroesophageal reflux disease)   ? Paroxysmal atrial fibrillation ([place]) 04/19/2021  ? ZIO monitor-2022- approximately 10 minutes atrial fibrillation rapid ventricular response  ? ? ?Tobacco Use: ?Social History  ? ?Tobacco Use  ?Smoking Status Never  ?Smokeless Tobacco Never  ?Tobacco Comments  ? 1 can per 1.5 weeks  ? ? ?Labs: ?Review Flowsheet   ? ?  ?  Latest Ref Rng & Units 05/08/2021 05/10/2021  ?Labs for ITP Cardiac and Pulmonary Rehab  ?Hemoglobin A1c 4.8 - 5.6 % 6.3     ?PH, Arterial 7.350 - 7.450 7.415   7.325    ? 7.337    ? 7.299    ? 7.373    ? 7.293    ? 7.355    ? 7.392    ?PCO2 arterial 32.0 - 48.0 mmHg 37.0   45.3    ? 41.2    ? 45.8    ?  44.2    ? 55.3    ? 48.6    ? 38.1    ?Bicarbonate 20.0 - 28.0 mmol/L 23.3   23.5    ? 22.0    ? 22.7    ? 25.7    ? 26.7    ? 25.7    ? 27.1    ? 23.2    ?TCO2 22 - 32 mmol/L  25    ? 23    ? 24    ? 25    ? 27    ? 28    ? 27    ? 28    ? 29    ? 24    ? 22    ? 24    ?Acid-base deficit 0.0 - 2.0 mmol/L 0.7   2.0    ? 3.0    ? 4.0    ? 1.0    ? 1.0    ?O2 Saturation % 97.0   97.0    ? 98.0    ? 56.4    ? 98.0    ? 100.0    ? 100.0    ? 84.0    ? 100.0    ? 99.0    ?  ? ? Multiple values from one day are sorted in reverse-chronological order  ?  ?  ? ? ?Capillary Blood Glucose: ?Lab Results  ?Component Value Date  ? GLUCAP 112 (H) 05/15/2021  ? GLUCAP 96 05/15/2021  ? GLUCAP 111 (H) 05/15/2021  ? GLUCAP 96 05/15/2021  ? GLUCAP 93 05/14/2021   ? ? ? ?Exercise Target Goals: ?Exercise Program Goal: ?Individual exercise prescription set using results from initial 6 min walk test and THRR while considering  patient?s activity barriers and safety.  ? ?Exercise Prescription Goal: ?Starting with aerobic activity 30 plus minutes a day, 3 days per week for initial exercise prescription. Provide home exercise prescription and guidelines that participant acknowledges understanding prior to discharge. ? ?Activity Barriers & Risk Stratification: ? Activity Barriers & Cardiac Risk Stratification - 07/20/21 0913   ? ?  ? Activity Barriers & Cardiac Risk Stratification  ? Activity Barriers None   ? Cardiac Risk Stratification High   Under 5 MET's  ? ?  ?  ? ?  ? ? ?6 Minute Walk: ? 6 Minute Walk   ? ? [place] Name 07/20/21 0911  ?  ?  ?  ? 6 Minute Walk  ? Phase Initial    ? Distance 1745 feet    ? Walk Time 6 minutes    ? # of Rest Breaks 0    ? MPH 3.3    ? METS 3.68    ? RPE 10    ? Perceived Dyspnea  0    ? VO2 Peak 12.9    ? Symptoms Yes (comment)    ? Comments calf tighness. Relieved with rest.    ? Resting HR 69 bpm    ? Resting BP 104/70    ? Resting Oxygen Saturation  97 %    ? Exercise Oxygen Saturation  during 6 min walk 98 %    ? Max Ex. HR 78 bpm    ? Max Ex. BP 112/60    ? 2 Minute Post BP 102/62    ? ?  ?  ? ?  ? ? ?Oxygen Initial Assessment: ? ? ?Oxygen Re-Evaluation: ? ? ?Oxygen Discharge (Final Oxygen Re-Evaluation): ? ? ?Initial Exercise Prescription: ? Initial Exercise Prescription -  07/20/21 0900   ? ?  ? Date of Initial Exercise RX and Referring Provider  ? Date 07/20/21   ? Referring Provider Dr. [name]   ? Expected Discharge Date 09/15/21   ?  ? Treadmill  ? MPH 2.7   ? Grade 1   ? Minutes 15   ? METs 3.44   ?  ? Bike  ? Level 1.2   ? Minutes 15   ? METs 3.18   ?  ? Prescription Details  ? Frequency (times per week) 3   ? Duration Progress to 30 minutes of continuous aerobic without signs/symptoms of physical distress   ?  ? Intensity  ? THRR  40-80% of Max Heartrate 65-130   ? Ratings of Perceived Exertion 11-13   ? Perceived Dyspnea 0-4   ?  ? Progression  ? Progression Continue progressive overload as per policy without signs/symptoms or physical distress.   ?  ? Resistance Training  ? Training Prescription Yes   ? Weight 3   ? Reps 10-15   ? ?  ?  ? ?  ? ? ?Perform Capillary Blood Glucose checks as needed. ? ?Exercise Prescription Changes: ? ? ?Exercise Comments: ? ? ?Exercise Goals and Review: ? ? Exercise Goals   ? ? [place] Name 07/20/21 0919  ?  ?  ?  ?  ?  ? Exercise Goals  ? Increase Physical Activity Yes      ? Intervention Provide advice, education, support and counseling about physical activity/exercise needs.;Develop an individualized exercise prescription for aerobic and resistive training based on initial evaluation findings, risk stratification, comorbidities and participant's personal goals.      ? Expected Outcomes Short Term: Attend rehab on a regular basis to increase amount of physical activity.;Long Term: Exercising regularly at least 3-5 days a week.;Long Term: Add in home exercise to make exercise part of routine and to increase amount of physical activity.      ? Increase Strength and Stamina Yes      ? Intervention Provide advice, education, support and counseling about physical activity/exercise needs.;Develop an individualized exercise prescription for aerobic and resistive training based on initial evaluation findings, risk stratification, comorbidities and participant's personal goals.      ? Expected Outcomes Short Term: Increase workloads from initial exercise prescription for resistance, speed, and METs.;Short Term: Perform resistance training exercises routinely during rehab and add in resistance training at home;Long Term: Improve cardiorespiratory fitness, muscular endurance and strength as measured by increased METs and functional capacity (6MWT)      ? Able to understand and use rate of perceived exertion (RPE) scale Yes       ? Intervention Provide education and explanation on how to use RPE scale      ? Expected Outcomes Short Term: Able to use RPE daily in rehab to express subjective intensity level;Long Term:  Able to use

## 2021-07-24 ENCOUNTER — Encounter (HOSPITAL_COMMUNITY)
Admission: RE | Admit: 2021-07-24 | Discharge: 2021-07-24 | Disposition: A | Discharge status: Home / Self Care | Payer: Federal, State, Local not specified - PPO | Source: Ambulatory Visit | Attending: Cardiology | Admitting: Cardiology

## 2021-07-24 DIAGNOSIS — D151 Benign neoplasm of heart: Secondary | ICD-10-CM

## 2021-07-24 DIAGNOSIS — Z5189 Encounter for other specified aftercare: Secondary | ICD-10-CM | POA: Diagnosis not present

## 2021-07-24 NOTE — Progress Notes (Signed)
QUALITY OF LIFE SCORE REVIEW ? Pt completed Quality of Life survey as a participant in Cardiac Rehab.  Scores 21.0 or below are considered low.  Pt scores are well above.  Overall 27.21 Health and Function 25.83, socioeconomic 29.64, physiological and spiritual 25.71, family 30.0. Patient denies any psychosocial barriers.  Eager to learn his new norm post heart surgery.  Feels supported with family and friends.   Will continue to monitor and intervene as necessary. [name], BSN ?Cardiac and Pulmonary Rehab Nurse Navigator  ? ? ?

## 2021-07-24 NOTE — Progress Notes (Signed)
Daily Session Note ? ?Patient Details  ?Name: Shawn Suarez ?MRN: [id number] ?Date of Birth: 02-09-1966 ?Referring Provider:   ?Flowsheet Row CARDIAC REHAB PHASE II ORIENTATION from 07/20/2021 in [place] [facility]  ?Referring Provider Dr. [name]  ? ?  ? ? ?Encounter Date: 07/24/2021 ? ?Check In: ? Session Check In - 07/24/21 0705   ? ?  ? Check-In  ? Supervising physician immediately available to respond to emergencies [facility] immediately available   ? Physician(s) Dr. Pietro Cassis   ? Location [facility]   ? Staff Present Esmeralda Links BS, ACSM EP-C, Exercise Physiologist;Jeanann Balinski Wilber Oliphant, RN, Quentin Ore, MS, ACSM-CEP, Exercise Physiologist;Annedrea Rosezella [place], RN, MHA;Olinty Celesta Aver, MS, ACSM CEP, Exercise Physiologist   ? Virtual Visit No   ? Medication changes reported     No   ? Fall or balance concerns reported    No   ? Tobacco Cessation No Change   ? Current number of cigarettes/nicotine per day     0   ? Warm-up and Cool-down Performed as group-led instruction   ? Resistance Training Performed Yes   ? VAD Patient? No   ? PAD/SET Patient? No   ?  ? Pain Assessment  ? Currently in Pain? No/denies   ? Pain Score 0-No pain   ? Multiple Pain Sites No   ? ?  ?  ? ?  ? ? ?Capillary Blood Glucose: ?No results found for this or any previous visit (from the past 24 hour(s)). ? ? ? ?Social History  ? ?Tobacco Use  ?Smoking Status Never  ?Smokeless Tobacco Never  ?Tobacco Comments  ? 1 can per 1.5 weeks  ? ? ?Goals Met:  ?Exercise tolerated well ?No report of concerns or symptoms today ?Strength training completed today ? ?Goals Unmet:  ?Not Applicable ? ?Comments: Pt started cardiac rehab today.  Pt tolerated light exercise without difficulty. VSS, telemetry-SR, asymptomatic.  Medication list reconciled. Pt denies barriers to medication compliance.  PSYCHOSOCIAL ASSESSMENT:  PHQ-0. Pt exhibits positive coping skills, hopeful outlook with supportive family. No  psychosocial needs identified at this time, no psychosocial interventions necessary.    Pt enjoys  being active around the home as well as exercise.   Pt oriented to exercise equipment and routine. Understanding verbalized. [name], BSN ?Cardiac and Pulmonary Rehab Nurse Navigator  ? ? ? ?Dr. Fransico Him is Medical Director for Cardiac Rehab at [facility]. ?

## 2021-07-26 ENCOUNTER — Encounter (HOSPITAL_COMMUNITY)
Admission: RE | Admit: 2021-07-26 | Discharge: 2021-07-26 | Disposition: A | Discharge status: Home / Self Care | Payer: Federal, State, Local not specified - PPO | Source: Ambulatory Visit | Attending: Cardiology | Admitting: Cardiology

## 2021-07-26 VITALS — Ht 69.5 in | Wt 227.1 lb

## 2021-07-26 DIAGNOSIS — D151 Benign neoplasm of heart: Secondary | ICD-10-CM

## 2021-07-26 DIAGNOSIS — Z5189 Encounter for other specified aftercare: Secondary | ICD-10-CM | POA: Diagnosis not present

## 2021-07-26 NOTE — Progress Notes (Signed)
Shawn Suarez 56 y.o. male ?Nutrition Note ?Shawn Suarez is motivated to make lifestyle changes to aid with cardiac rehab. Patient has medical history of resection of left atrial myxoma, paroxysmal atrial fibrillation, CAD. He lives at home with his wife. He reports making many dietary changes and is down about 26#. He has decreased portion sizes, choosing lean meats, and is trying to introduce more vegetables. He does recognize need to increase vegetable intake; however, he likes very few vegetables, prefers canned vegetables. His goal weight is about ~195#.  Patient with great nutrition knowledge just working toward consistent adherence.  ? ?Labs: A1c 6.3 ? ?RMR: 1847kcals (x1.4 2587kcals) ? ?Nutrition Diagnosis ?(Improving) Obesity related to excessive energy intake as evidenced by a BMI of 33.05 ?(Improving) Excessive carbohydrate intake related to consumption of convenience foods and sugary beverages as evidenced by A1C 6.3 ? ?Nutrition Intervention ?Pt?s individual nutrition plan reviewed with pt. ?Benefits of adopting Heart Healthy diet discussed.   ?Continue client-centered nutrition education by RD, as part of interdisciplinary care. ? ?Monitor/Evaluation: ?Patient reports motivation to make lifestyle changes for adherence to heart healthy diet recommendation, blood sugar control, and weight management. We discussed strategies for weight loss including eating frequency, portion sizes, protein+carbohydrate snacks.  Handouts/notes given. Patient amicable to RD suggestions and verbalizes understanding. Will follow-up as needed.  ? ?8 minutes spent in review of topics related to a heart healthy diet including sodium intake, blood sugar control, weight management, and fiber intake. ? ?Goal(s) ?Continue to increase to fiber intake. Aim for 1/2 the plate vegetables at lunch and dinner. ?Continue to monitor eating frequency, checking in every 3-4 hours to aid with managing protein intake. ?Pt to identify and limit food  sources of saturated fat, trans fat, refined carbohydrates and sodium ?Pt to identify food quantities necessary to achieve weight loss of 6-24 lb at graduation from cardiac rehab.  ?Pt able to name foods that affect blood glucose. Continue to limit simple sugars, refined carbohydrates, sugary beverages, etc.  ?Pt to describe the benefit of including lean protein/plant proteins, fruits, vegetables, whole grains, nuts/seeds, and low-fat dairy products in a heart healthy meal plan. ?Pt to practice mindful and intuitive eating exercises ? ?Plan:  ?Will provide client-centered nutrition education as part of interdisciplinary care ?Monitor and evaluate progress toward nutrition goal with team. ? ? ?Aldona Bar [place], MS, RDN, LDN ? ?

## 2021-07-28 ENCOUNTER — Encounter (HOSPITAL_COMMUNITY)
Admission: RE | Admit: 2021-07-28 | Discharge: 2021-07-28 | Disposition: A | Discharge status: Home / Self Care | Payer: Federal, State, Local not specified - PPO | Source: Ambulatory Visit | Attending: Cardiology | Admitting: Cardiology

## 2021-07-28 DIAGNOSIS — D151 Benign neoplasm of heart: Secondary | ICD-10-CM

## 2021-07-28 DIAGNOSIS — Z5189 Encounter for other specified aftercare: Secondary | ICD-10-CM | POA: Diagnosis not present

## 2021-07-31 ENCOUNTER — Encounter (HOSPITAL_COMMUNITY)
Admission: RE | Admit: 2021-07-31 | Discharge: 2021-07-31 | Disposition: A | Discharge status: Home / Self Care | Payer: Federal, State, Local not specified - PPO | Source: Ambulatory Visit | Attending: Cardiology | Admitting: Cardiology

## 2021-07-31 DIAGNOSIS — D151 Benign neoplasm of heart: Secondary | ICD-10-CM

## 2021-07-31 DIAGNOSIS — Z5189 Encounter for other specified aftercare: Secondary | ICD-10-CM | POA: Diagnosis not present

## 2021-08-02 ENCOUNTER — Encounter (HOSPITAL_COMMUNITY)
Admission: RE | Admit: 2021-08-02 | Discharge: 2021-08-02 | Disposition: A | Discharge status: Home / Self Care | Payer: Federal, State, Local not specified - PPO | Source: Ambulatory Visit | Attending: Cardiology | Admitting: Cardiology

## 2021-08-02 DIAGNOSIS — D151 Benign neoplasm of heart: Secondary | ICD-10-CM | POA: Diagnosis not present

## 2021-08-02 DIAGNOSIS — Z5189 Encounter for other specified aftercare: Secondary | ICD-10-CM | POA: Diagnosis not present

## 2021-08-04 ENCOUNTER — Encounter (HOSPITAL_COMMUNITY)
Admission: RE | Admit: 2021-08-04 | Discharge: 2021-08-04 | Disposition: A | Discharge status: Home / Self Care | Payer: Federal, State, Local not specified - PPO | Source: Ambulatory Visit | Attending: Cardiology | Admitting: Cardiology

## 2021-08-04 DIAGNOSIS — D151 Benign neoplasm of heart: Secondary | ICD-10-CM

## 2021-08-04 DIAGNOSIS — Z5189 Encounter for other specified aftercare: Secondary | ICD-10-CM | POA: Diagnosis not present

## 2021-08-07 ENCOUNTER — Encounter (HOSPITAL_COMMUNITY)
Admission: RE | Admit: 2021-08-07 | Discharge: 2021-08-07 | Disposition: A | Discharge status: Home / Self Care | Payer: Federal, State, Local not specified - PPO | Source: Ambulatory Visit | Attending: Cardiology | Admitting: Cardiology

## 2021-08-07 DIAGNOSIS — D151 Benign neoplasm of heart: Secondary | ICD-10-CM | POA: Insufficient documentation

## 2021-08-07 DIAGNOSIS — Z48812 Encounter for surgical aftercare following surgery on the circulatory system: Secondary | ICD-10-CM | POA: Diagnosis not present

## 2021-08-07 NOTE — Progress Notes (Signed)
CARDIAC REHAB PHASE 2 ? ?Reviewed home exercise with pt today. Pt is tolerating exercise well. Pt will continue to exercise on their own by walking, using bands and [employer] for 30-45 minutes per session 3 days a week in addition to the 3 days in CRP2. Advised pt on THRR, RPE scale, hydration and temperature/humidity precautions. Reinforced sternal precautions, S/S to stop exercise and when to call MD vs 911. Encouraged warm up cool down and stretches with exercise sessions. Pt verbalized understanding, all questions were answered and pt was given a copy to take home.  ?  ?Kirby Funk ACSM-CEP ?08/07/2021 ?12:05 PM ? ?

## 2021-08-09 ENCOUNTER — Encounter (HOSPITAL_COMMUNITY)
Admission: RE | Admit: 2021-08-09 | Discharge: 2021-08-09 | Disposition: A | Discharge status: Home / Self Care | Payer: Federal, State, Local not specified - PPO | Source: Ambulatory Visit | Attending: Cardiology | Admitting: Cardiology

## 2021-08-09 DIAGNOSIS — D151 Benign neoplasm of heart: Secondary | ICD-10-CM | POA: Diagnosis not present

## 2021-08-09 DIAGNOSIS — Z48812 Encounter for surgical aftercare following surgery on the circulatory system: Secondary | ICD-10-CM | POA: Diagnosis not present

## 2021-08-11 ENCOUNTER — Encounter (HOSPITAL_COMMUNITY)
Admission: RE | Admit: 2021-08-11 | Discharge: 2021-08-11 | Disposition: A | Discharge status: Home / Self Care | Payer: Federal, State, Local not specified - PPO | Source: Ambulatory Visit | Attending: Cardiology | Admitting: Cardiology

## 2021-08-11 DIAGNOSIS — Z48812 Encounter for surgical aftercare following surgery on the circulatory system: Secondary | ICD-10-CM | POA: Diagnosis not present

## 2021-08-11 DIAGNOSIS — D151 Benign neoplasm of heart: Secondary | ICD-10-CM | POA: Diagnosis not present

## 2021-08-14 ENCOUNTER — Other Ambulatory Visit: Payer: Self-pay | Admitting: Thoracic Surgery (Cardiothoracic Vascular Surgery)

## 2021-08-14 ENCOUNTER — Encounter (HOSPITAL_COMMUNITY)
Admission: RE | Admit: 2021-08-14 | Discharge: 2021-08-14 | Disposition: A | Discharge status: Home / Self Care | Payer: Federal, State, Local not specified - PPO | Source: Ambulatory Visit | Attending: Cardiology | Admitting: Cardiology

## 2021-08-14 DIAGNOSIS — D151 Benign neoplasm of heart: Secondary | ICD-10-CM

## 2021-08-14 DIAGNOSIS — Z48812 Encounter for surgical aftercare following surgery on the circulatory system: Secondary | ICD-10-CM | POA: Diagnosis not present

## 2021-08-14 DIAGNOSIS — I5189 Other ill-defined heart diseases: Secondary | ICD-10-CM

## 2021-08-15 ENCOUNTER — Encounter: Payer: Self-pay | Admitting: Thoracic Surgery (Cardiothoracic Vascular Surgery)

## 2021-08-15 ENCOUNTER — Ambulatory Visit: Payer: Federal, State, Local not specified - PPO | Admitting: Thoracic Surgery (Cardiothoracic Vascular Surgery)

## 2021-08-15 ENCOUNTER — Ambulatory Visit
Admission: RE | Admit: 2021-08-15 | Discharge: 2021-08-15 | Disposition: A | Discharge status: Home / Self Care | Payer: Federal, State, Local not specified - PPO | Source: Ambulatory Visit | Attending: Thoracic Surgery (Cardiothoracic Vascular Surgery) | Admitting: Thoracic Surgery (Cardiothoracic Vascular Surgery)

## 2021-08-15 VITALS — BP 114/75 | HR 61 | Resp 20 | Ht 69.5 in | Wt 225.4 lb

## 2021-08-15 DIAGNOSIS — D151 Benign neoplasm of heart: Secondary | ICD-10-CM | POA: Diagnosis not present

## 2021-08-15 DIAGNOSIS — Z9889 Other specified postprocedural states: Secondary | ICD-10-CM | POA: Diagnosis not present

## 2021-08-15 DIAGNOSIS — I5189 Other ill-defined heart diseases: Secondary | ICD-10-CM

## 2021-08-15 NOTE — Progress Notes (Signed)
? ?   ?[address] ?      [place] [address] ?            [phone]   ? ? ?HPI: [name] returns for a scheduled follow-up visit. ? 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  We began with a thoracotomy approach but had complications with cannulating the femoral artery and converted to sternotomy.  Postoperatively had issues with atrial fibrillation and volume retention but went home on day 12. ? ?I saw him in the office on 06/13/2021.  He was doing well at that time. ? ?Since then he continues to have palpitations occasionally.  He had a respiratory illness a couple weeks ago and had a lot of palpitations around that time.  They have decreased in frequency since then.  He has been participating in cardiac rehab and that is going well. ? ?His primary complaints are feeling like his pectoral muscles are pulling at the sternotomy and numbness in his right foot when he sits down which is resolved by standing up. ? ?Past Medical History:  ?Diagnosis Date  ? 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 Detected on coronary CT scan/MRI 2022  ? Cancer [facility])   ? 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 Coronary CT-2022- no flow-limiting disease, calcium score 33, 60th percentile (minimal LAD calcified plaque)  ? Dysrhythmia   ? 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 ZIO monitor 2022-symptomatic  ? GERD (gastroesophageal reflux disease)   ? Paroxysmal atrial fibrillation ([place]) 04/19/2021  ? ZIO monitor-2022- approximately 10 minutes atrial fibrillation rapid ventricular response  ? ? ?Current Outpatient Medications  ?Medication Sig Dispense Refill  ? amiodarone (PACERONE) 200 MG tablet Take 1 tablet (200 mg total) by mouth 2 (two) times daily. For 10 days then take 1 tablet (200 mg) daily thereafter (Patient taking differently: Take 200 mg by mouth daily.) 60 tablet 1  ? apixaban  (ELIQUIS) 5 MG TABS tablet Take 1 tablet (5 mg total) by mouth 2 (two) times daily. 60 tablet 5  ? aspirin EC 81 MG tablet Take 1 tablet (81 mg total) by mouth daily. Swallow whole. 90 tablet 3  ? metoprolol tartrate (LOPRESSOR) 25 MG tablet Take 1 tablet (25 mg total) by mouth 3 (three) times daily. 90 tablet 3  ? mometasone-formoterol (DULERA) 100-5 MCG/ACT AERO Inhale 2 puffs into the lungs 2 (two) times daily as needed (Asthma like symptoms).    ? pantoprazole (PROTONIX) 20 MG tablet Take 20 mg by mouth See admin instructions. Take 20 mg daily, may take a second 20 mg dose as needed for heartburn    ? ?No current facility-administered medications for this visit.  ? ? ?Physical Exam ?BP 114/75 (BP Location: Left Arm, Patient Position: Sitting, Cuff Size: Large)   Pulse 61   Resp 20   Ht 5' 9.5" (1.765 m)   Wt 225 lb 6.4 oz (102.2 kg)   SpO2 95% Comment: RA  BMI 32.81 kg/m?  ?56 year old man in no acute distress ?Alert and oriented x3 with no focal deficits ?Lungs clear bilaterally ?Cardiac regular rate and rhythm, no rub or murmur ?Sternotomy well-healed ?No peripheral edema ? ?Diagnostic Tests: ?I personally reviewed his chest x-ray.  Shows postoperative changes but otherwise unremarkable. ? ?Impression: ?Shawn Suarez is a 56 year old man with a history of paroxysmal atrial fibrillation, reflux, prostate cancer, obesity, and posttraumatic  stress disorder, who had a resection of the left atrial myxoma and pulmonary vein isolation on 05/10/2021. ? ?From a surgical standpoint he is doing well.  His wounds of healed well.  There is sensation of the muscles pulling at the sternotomy wound will improve with time.  There are no restrictions on his activities.  Advised him to start using those muscles more, but building into activities gradually. ? ?The numbness in his right foot when he sits down is likely due to some pressure on the nerve in that position.  It is resolved by standing up.  He is not having any  claudication type symptoms.  That should improve with time. ? ?Plan: ?Follow-up with Dr. Marlou Porch as scheduled on 09/05/2021 ?Return in 3 months to check on progress ? ?Melrose Nakayama, MD ?[facility] ?[phone] ? ? ? ? ?

## 2021-08-15 NOTE — Progress Notes (Addendum)
Cardiac Individual Treatment Plan ? ?Patient Details  ?Name: Shawn Suarez ?MRN: [id number] ?Date of Birth: April 18, 1965 ?Referring Provider:   ?Flowsheet Row CARDIAC REHAB PHASE II ORIENTATION from 07/20/2021 in [place]  ?Referring Provider Dr. [name]  ? ?  ? ? ?Initial Encounter Date:  ?Flowsheet Row CARDIAC REHAB PHASE II ORIENTATION from 07/20/2021 in [place]  ?Date 07/20/21  ? ?  ? ? ?Visit Diagnosis: 05/10/21 Resection of Left Atrial Myxoma with cryoablation ? ?Patient's Home Medications on Admission: ? ?Current Outpatient Medications:  ?  acetaminophen (TYLENOL) 500 MG tablet, Take 1,000 mg by mouth every 6 (six) hours as needed for moderate pain., Disp: , Rfl:  ?  amiodarone (PACERONE) 200 MG tablet, Take 1 tablet (200 mg total) by mouth 2 (two) times daily. For 10 days then take 1 tablet (200 mg) daily thereafter (Patient taking differently: Take 200 mg by mouth daily.), Disp: 60 tablet, Rfl: 1 ?  apixaban (ELIQUIS) 5 MG TABS tablet, Take 1 tablet (5 mg total) by mouth 2 (two) times daily., Disp: 60 tablet, Rfl: 5 ?  aspirin EC 81 MG tablet, Take 1 tablet (81 mg total) by mouth daily. Swallow whole., Disp: 90 tablet, Rfl: 3 ?  furosemide (LASIX) 40 MG tablet, Take 1 tablet (40 mg total) by mouth as needed. (Patient not taking: Reported on 07/17/2021), Disp: 30 tablet, Rfl: 0 ?  metoprolol tartrate (LOPRESSOR) 25 MG tablet, Take 1 tablet (25 mg total) by mouth 3 (three) times daily., Disp: 90 tablet, Rfl: 3 ?  mometasone-formoterol (DULERA) 100-5 MCG/ACT AERO, Inhale 2 puffs into the lungs 2 (two) times daily as needed (Asthma like symptoms)., Disp: , Rfl:  ?  pantoprazole (PROTONIX) 20 MG tablet, Take 20 mg by mouth See admin instructions. Take 20 mg daily, may take a second 20 mg dose as needed for heartburn, Disp: , Rfl:  ?  traMADol (ULTRAM) 50 MG tablet, Take 1 tablet (50 mg total) by mouth every 6 (six) hours as needed for moderate pain.  (Patient not taking: Reported on 07/17/2021), Disp: 20 tablet, Rfl: 0 ? ?Past Medical History: ?Past Medical History:  ?Diagnosis Date  ? 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 Detected on coronary CT scan/MRI 2022  ? Cancer [facility])   ? 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 Coronary CT-2022- no flow-limiting disease, calcium score 33, 60th percentile (minimal LAD calcified plaque)  ? Dysrhythmia   ? 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 ZIO monitor 2022-symptomatic  ? GERD (gastroesophageal reflux disease)   ? Paroxysmal atrial fibrillation ([place]) 04/19/2021  ? ZIO monitor-2022- approximately 10 minutes atrial fibrillation rapid ventricular response  ? ? ?Tobacco Use: ?Social History  ? ?Tobacco Use  ?Smoking Status Never  ?Smokeless Tobacco Never  ?Tobacco Comments  ? 1 can per 1.5 weeks  ? ? ?Labs: ?Review Flowsheet   ? ?  ?  Latest Ref Rng & Units 05/08/2021 05/10/2021  ?Labs for ITP Cardiac and Pulmonary Rehab  ?Hemoglobin A1c 4.8 - 5.6 % 6.3     ?PH, Arterial 7.350 - 7.450 7.415   7.325    ? 7.337    ? 7.299    ? 7.373    ? 7.293    ? 7.355    ? 7.392    ?PCO2 arterial 32.0 - 48.0 mmHg 37.0   45.3    ? 41.2    ? 45.8    ?  44.2    ? 55.3    ? 48.6    ? 38.1    ?Bicarbonate 20.0 - 28.0 mmol/L 23.3   23.5    ? 22.0    ? 22.7    ? 25.7    ? 26.7    ? 25.7    ? 27.1    ? 23.2    ?TCO2 22 - 32 mmol/L  25    ? 23    ? 24    ? 25    ? 27    ? 28    ? 27    ? 28    ? 29    ? 24    ? 22    ? 24    ?Acid-base deficit 0.0 - 2.0 mmol/L 0.7   2.0    ? 3.0    ? 4.0    ? 1.0    ? 1.0    ?O2 Saturation % 97.0   97.0    ? 98.0    ? 56.4    ? 98.0    ? 100.0    ? 100.0    ? 84.0    ? 100.0    ? 99.0    ?  ? ? Multiple values from one day are sorted in reverse-chronological order  ?  ?  ? ? ?Capillary Blood Glucose: ?Lab Results  ?Component Value Date  ? GLUCAP 112 (H) 05/15/2021  ? GLUCAP 96 05/15/2021  ? GLUCAP 111 (H) 05/15/2021  ? GLUCAP 96 05/15/2021  ? GLUCAP 93 05/14/2021   ? ? ? ?Exercise Target Goals: ?Exercise Program Goal: ?Individual exercise prescription set using results from initial 6 min walk test and THRR while considering  patient?s activity barriers and safety.  ? ?Exercise Prescription Goal: ?Initial exercise prescription builds to 30-45 minutes a day of aerobic activity, 2-3 days per week.  Home exercise guidelines will be given to patient during program as part of exercise prescription that the participant will acknowledge. ? ?Activity Barriers & Risk Stratification: ? Activity Barriers & Cardiac Risk Stratification - 07/20/21 0913   ? ?  ? Activity Barriers & Cardiac Risk Stratification  ? Activity Barriers None   ? Cardiac Risk Stratification High   Under 5 MET's  ? ?  ?  ? ?  ? ? ?6 Minute Walk: ? 6 Minute Walk   ? ? [place] Name 07/20/21 0911  ?  ?  ?  ? 6 Minute Walk  ? Phase Initial    ? Distance 1745 feet    ? Walk Time 6 minutes    ? # of Rest Breaks 0    ? MPH 3.3    ? METS 3.68    ? RPE 10    ? Perceived Dyspnea  0    ? VO2 Peak 12.9    ? Symptoms Yes (comment)    ? Comments calf tighness. Relieved with rest.    ? Resting HR 69 bpm    ? Resting BP 104/70    ? Resting Oxygen Saturation  97 %    ? Exercise Oxygen Saturation  during 6 min walk 98 %    ? Max Ex. HR 78 bpm    ? Max Ex. BP 112/60    ? 2 Minute Post BP 102/62    ? ?  ?  ? ?  ? ? ?Oxygen Initial Assessment: ? ? ?Oxygen Re-Evaluation: ? ? ?Oxygen Discharge (Final Oxygen Re-Evaluation): ? ? ?  Initial Exercise Prescription: ? Initial Exercise Prescription - 07/20/21 0900   ? ?  ? Date of Initial Exercise RX and Referring Provider  ? Date 07/20/21   ? Referring Provider Dr. [name]   ? Expected Discharge Date 09/15/21   ?  ? Treadmill  ? MPH 2.7   ? Grade 1   ? Minutes 15   ? METs 3.44   ?  ? Bike  ? Level 1.2   ? Minutes 15   ? METs 3.18   ?  ? Prescription Details  ? Frequency (times per week) 3   ? Duration Progress to 30 minutes of continuous aerobic without signs/symptoms of physical distress   ?  ?  Intensity  ? THRR 40-80% of Max Heartrate 65-130   ? Ratings of Perceived Exertion 11-13   ? Perceived Dyspnea 0-4   ?  ? Progression  ? Progression Continue progressive overload as per policy without signs/symptoms or physical distress.   ?  ? Resistance Training  ? Training Prescription Yes   ? Weight 3   ? Reps 10-15   ? ?  ?  ? ?  ? ? ?Perform Capillary Blood Glucose checks as needed. ? ?Exercise Prescription Changes: ? ? Exercise Prescription Changes   ? ? [place] Name 07/24/21 0830 08/07/21 0830  ?  ?  ?  ?  ? Response to Exercise  ? Blood Pressure (Admit) 110/70 104/62     ? Blood Pressure (Exercise) 140/80 142/82     ? Blood Pressure (Exit) 104/64 98/61     ? Heart Rate (Admit) 68 bpm 67 bpm     ? Heart Rate (Exercise) 94 bpm 105 bpm     ? Heart Rate (Exit) 75 bpm 71 bpm     ? Rating of Perceived Exertion (Exercise) 11.5 10.5     ? Perceived Dyspnea (Exercise) 0 0     ? Symptoms 0 0     ? Comments Pt first day in the CRP2 program Reviewed MET's, goals and home     ? Duration Progress to 30 minutes of  aerobic without signs/symptoms of physical distress Progress to 30 minutes of  aerobic without signs/symptoms of physical distress     ? Intensity THRR unchanged THRR unchanged     ?  ? Progression  ? Progression Continue to progress workloads to maintain intensity without signs/symptoms of physical distress. Continue to progress workloads to maintain intensity without signs/symptoms of physical distress.     ? Average METs 3.45 4.29     ?  ? Resistance Training  ? Training Prescription Yes Yes     ? Weight 3 5     ? Reps 10-15 10-15     ? Time 10 Minutes 10 Minutes     ?  ? Treadmill  ? MPH 3 3.4     ? Grade 1 1     ? Minutes 15 15     ? METs 3.71 4.07     ?  ? Bike  ? Level 1.2 1.9     ? Minutes 15 15     ? METs 3.18 4.51     ?  ? Home Exercise Plan  ? Plans to continue exercise at -- [facility] (comment)     ? Frequency -- Add 3 additional days to program exercise sessions.     ? Initial Home Exercises  Provided -- 08/07/21     ? ?  ?  ? ?  ? ? ?  Exercise Comments: ? ? Exercise Comments   ? ? [place] Name 07/24/21 0830 08/07/21 0830  ?  ?  ?  ? Exercise Comments Pt first day in the CRP2 program. Pt tolerated exer

## 2021-08-16 ENCOUNTER — Encounter (HOSPITAL_COMMUNITY)
Admission: RE | Admit: 2021-08-16 | Discharge: 2021-08-16 | Disposition: A | Discharge status: Home / Self Care | Payer: Federal, State, Local not specified - PPO | Source: Ambulatory Visit | Attending: Cardiology | Admitting: Cardiology

## 2021-08-16 DIAGNOSIS — D151 Benign neoplasm of heart: Secondary | ICD-10-CM

## 2021-08-16 DIAGNOSIS — Z48812 Encounter for surgical aftercare following surgery on the circulatory system: Secondary | ICD-10-CM | POA: Diagnosis not present

## 2021-08-18 ENCOUNTER — Encounter (HOSPITAL_COMMUNITY)
Admission: RE | Admit: 2021-08-18 | Discharge: 2021-08-18 | Disposition: A | Discharge status: Home / Self Care | Payer: Federal, State, Local not specified - PPO | Source: Ambulatory Visit | Attending: Cardiology | Admitting: Cardiology

## 2021-08-18 DIAGNOSIS — Z48812 Encounter for surgical aftercare following surgery on the circulatory system: Secondary | ICD-10-CM | POA: Diagnosis not present

## 2021-08-18 DIAGNOSIS — D151 Benign neoplasm of heart: Secondary | ICD-10-CM

## 2021-08-21 ENCOUNTER — Encounter (HOSPITAL_COMMUNITY)
Admission: RE | Admit: 2021-08-21 | Discharge: 2021-08-21 | Disposition: A | Discharge status: Home / Self Care | Payer: Federal, State, Local not specified - PPO | Source: Ambulatory Visit | Attending: Cardiology | Admitting: Cardiology

## 2021-08-21 ENCOUNTER — Other Ambulatory Visit (HOSPITAL_COMMUNITY): Payer: Self-pay

## 2021-08-21 ENCOUNTER — Encounter: Payer: Self-pay | Admitting: Cardiology

## 2021-08-21 DIAGNOSIS — D151 Benign neoplasm of heart: Secondary | ICD-10-CM

## 2021-08-21 DIAGNOSIS — Z48812 Encounter for surgical aftercare following surgery on the circulatory system: Secondary | ICD-10-CM | POA: Diagnosis not present

## 2021-08-21 MED ORDER — METOPROLOL TARTRATE 25 MG PO TABS: 12.5000 mg | ORAL_TABLET | Freq: Two times a day (BID) | ORAL | 2 refills | Status: DC

## 2021-08-23 ENCOUNTER — Encounter (HOSPITAL_COMMUNITY)
Admission: RE | Admit: 2021-08-23 | Discharge: 2021-08-23 | Disposition: A | Discharge status: Home / Self Care | Payer: Federal, State, Local not specified - PPO | Source: Ambulatory Visit | Attending: Cardiology | Admitting: Cardiology

## 2021-08-23 ENCOUNTER — Encounter: Payer: Self-pay | Admitting: Cardiology

## 2021-08-23 DIAGNOSIS — Z48812 Encounter for surgical aftercare following surgery on the circulatory system: Secondary | ICD-10-CM | POA: Diagnosis not present

## 2021-08-23 DIAGNOSIS — D151 Benign neoplasm of heart: Secondary | ICD-10-CM | POA: Diagnosis not present

## 2021-08-25 ENCOUNTER — Encounter (HOSPITAL_COMMUNITY)
Admission: RE | Admit: 2021-08-25 | Discharge: 2021-08-25 | Disposition: A | Discharge status: Home / Self Care | Payer: Federal, State, Local not specified - PPO | Source: Ambulatory Visit | Attending: Cardiology | Admitting: Cardiology

## 2021-08-25 DIAGNOSIS — D151 Benign neoplasm of heart: Secondary | ICD-10-CM | POA: Diagnosis not present

## 2021-08-25 DIAGNOSIS — Z48812 Encounter for surgical aftercare following surgery on the circulatory system: Secondary | ICD-10-CM | POA: Diagnosis not present

## 2021-08-27 ENCOUNTER — Other Ambulatory Visit: Payer: Self-pay | Admitting: Physician Assistant

## 2021-08-28 ENCOUNTER — Encounter (HOSPITAL_COMMUNITY)
Admission: RE | Admit: 2021-08-28 | Discharge: 2021-08-28 | Disposition: A | Discharge status: Home / Self Care | Payer: Federal, State, Local not specified - PPO | Source: Ambulatory Visit | Attending: Cardiology | Admitting: Cardiology

## 2021-08-28 ENCOUNTER — Encounter: Payer: Self-pay | Admitting: Cardiology

## 2021-08-28 DIAGNOSIS — D151 Benign neoplasm of heart: Secondary | ICD-10-CM | POA: Diagnosis not present

## 2021-08-28 DIAGNOSIS — Z48812 Encounter for surgical aftercare following surgery on the circulatory system: Secondary | ICD-10-CM | POA: Diagnosis not present

## 2021-08-30 ENCOUNTER — Encounter (HOSPITAL_COMMUNITY)
Admission: RE | Admit: 2021-08-30 | Discharge: 2021-08-30 | Disposition: A | Discharge status: Home / Self Care | Payer: Federal, State, Local not specified - PPO | Source: Ambulatory Visit | Attending: Cardiology | Admitting: Cardiology

## 2021-08-30 DIAGNOSIS — D151 Benign neoplasm of heart: Secondary | ICD-10-CM | POA: Diagnosis not present

## 2021-08-30 DIAGNOSIS — Z48812 Encounter for surgical aftercare following surgery on the circulatory system: Secondary | ICD-10-CM | POA: Diagnosis not present

## 2021-09-01 ENCOUNTER — Encounter (HOSPITAL_COMMUNITY)
Admission: RE | Admit: 2021-09-01 | Discharge: 2021-09-01 | Disposition: A | Discharge status: Home / Self Care | Payer: Federal, State, Local not specified - PPO | Source: Ambulatory Visit | Attending: Cardiology | Admitting: Cardiology

## 2021-09-01 DIAGNOSIS — D151 Benign neoplasm of heart: Secondary | ICD-10-CM

## 2021-09-01 DIAGNOSIS — Z48812 Encounter for surgical aftercare following surgery on the circulatory system: Secondary | ICD-10-CM | POA: Diagnosis not present

## 2021-09-05 ENCOUNTER — Ambulatory Visit: Payer: Federal, State, Local not specified - PPO | Admitting: Cardiology

## 2021-09-06 ENCOUNTER — Encounter (HOSPITAL_COMMUNITY)
Admission: RE | Admit: 2021-09-06 | Discharge: 2021-09-06 | Disposition: A | Discharge status: Home / Self Care | Payer: Federal, State, Local not specified - PPO | Source: Ambulatory Visit | Attending: Cardiology | Admitting: Cardiology

## 2021-09-06 DIAGNOSIS — D151 Benign neoplasm of heart: Secondary | ICD-10-CM | POA: Diagnosis not present

## 2021-09-06 DIAGNOSIS — Z48812 Encounter for surgical aftercare following surgery on the circulatory system: Secondary | ICD-10-CM | POA: Diagnosis not present

## 2021-09-08 ENCOUNTER — Telehealth: Payer: Self-pay | Admitting: *Deleted

## 2021-09-08 ENCOUNTER — Ambulatory Visit (HOSPITAL_BASED_OUTPATIENT_CLINIC_OR_DEPARTMENT_OTHER): Payer: Federal, State, Local not specified - PPO | Admitting: Cardiology

## 2021-09-08 ENCOUNTER — Encounter (HOSPITAL_BASED_OUTPATIENT_CLINIC_OR_DEPARTMENT_OTHER): Payer: Self-pay | Admitting: Cardiology

## 2021-09-08 ENCOUNTER — Encounter (HOSPITAL_COMMUNITY)
Admission: RE | Admit: 2021-09-08 | Discharge: 2021-09-08 | Disposition: A | Discharge status: Home / Self Care | Payer: Federal, State, Local not specified - PPO | Source: Ambulatory Visit | Attending: Cardiology | Admitting: Cardiology

## 2021-09-08 VITALS — BP 102/64 | HR 60 | Ht 69.5 in | Wt 221.0 lb

## 2021-09-08 DIAGNOSIS — I480 Paroxysmal atrial fibrillation: Secondary | ICD-10-CM | POA: Diagnosis not present

## 2021-09-08 DIAGNOSIS — D151 Benign neoplasm of heart: Secondary | ICD-10-CM | POA: Insufficient documentation

## 2021-09-08 DIAGNOSIS — I2510 Atherosclerotic heart disease of native coronary artery without angina pectoris: Secondary | ICD-10-CM | POA: Diagnosis not present

## 2021-09-08 DIAGNOSIS — R400 Somnolence: Secondary | ICD-10-CM

## 2021-09-08 DIAGNOSIS — R0683 Snoring: Secondary | ICD-10-CM

## 2021-09-08 NOTE — Patient Instructions (Signed)
Medication Instructions:  Please discontinue your Amiodarone. Continue all other medications as listed.  *If you need a refill on your cardiac medications before your next appointment, please call your pharmacy*  Testing/Procedures: Your physician has recommended that you have a sleep study. This test records several body functions during sleep, including: brain activity, eye movement, oxygen and carbon dioxide blood levels, heart rate and rhythm, breathing rate and rhythm, the flow of air through your mouth and nose, snoring, body muscle movements, and chest and belly movement.  Follow-Up: At [facility], you and your health needs are our priority.  As part of our continuing mission to provide you with exceptional heart care, we have created designated Provider Care Teams.  These Care Teams include your primary Cardiologist (physician) and Advanced Practice Providers (APPs -  Physician Assistants and Nurse Practitioners) who all work together to provide you with the care you need, when you need it.  We recommend signing up for the patient portal called "MyChart".  Sign up information is provided on this After Visit Summary.  MyChart is used to connect with patients for Virtual Visits (Telemedicine).  Patients are able to view lab/test results, encounter notes, upcoming appointments, etc.  Non-urgent messages can be sent to your provider as well.   To learn more about what you can do with MyChart, go to [email or web address].    Your next appointment:   6 month(s)  The format for your next appointment:   In Person  Provider:   [name], MD {   Important Information About Sugar

## 2021-09-08 NOTE — Assessment & Plan Note (Signed)
Postresection, Dr. Roxan Hockey.  Doing well.

## 2021-09-08 NOTE — Progress Notes (Signed)
Cardiology Office Note:    Date:  09/08/2021   ID:  Shawn Suarez, DOB 1966/02/01, MRN [id number]  PCP:  Antony Contras, MD   [facility] Providers Cardiologist:  [name], MD     Referring MD: Antony Contras, MD    History of Present Illness:    Shawn Suarez is a 56 y.o. male here for follow up with CAD, Atrial Fibrillation, and Atrial Myxoma.  He was last seen on 02/21/21 for the evaluation of rapid heart rate/palpitations, continued heart rate elevation following exercise, shortness of breath.  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  Also has been working on PhD in Geographical information systems officer.  His daughter rides horses at [employer].  Previously in the [employer]  Was a Runner, broadcasting/film/video.  His father had a "widow maker "MI at age 47 with stent placement.  His mother has had atrial fibrillation since her 23s.  Has had mild intermittent asthma occasionally utilizing Dulera.  Last visit, he endorsed fatigue/palpitations/some shortness of breath with activity, as well as mild edema. He had gained 70 pounds since retiring. He said that he had eliminated caffeine from his diet and he was not smoking.   Today:  The patient has successfully lost 40 pounds since the last appointment, and plans to lose 30 more. He is now able to exercise and enjoys playing golf.    Yesterday morning he felt skipped heart beats as he was waking up. It did not feel like Afib to him, and he did not believe they were ventricular to no associated lightheadedness. Therefore, he believes that his PACs may be back.  The patient wants to come off the amiodarone. He has been on amiodarone for 4 months, and has taken his last dose this morning.   Also, he is concerned about episodes of falling asleep while working without realizing it. He will wake up an hour later, confused. He wants a sleep study to test for sleep apnea. Prior to his CABG, he snored so loudly that his wife sleeps in  another room. Lately, he believes his snoring has improved. However, he does consistently wake up throughout the night.  Sometimes he struggles to retain focus on tasks, although it is "85% better" than last visit. He has restarted working on his doctorate.  His CABG scar is looking well. He gets full sensation in the scar, and it is more sensitive than usual. The feeling of his shirt on the scar "drives him crazy." Unfortunately, he cannot sleep on his side as he feels his chest muscles "pulling inward," which he says is more painful than the surgery.  He denies any shortness of breath, or peripheral edema. No lightheadedness, headaches, syncope, orthopnea, or PND.     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   ZIO monitor 2022-symptomatic   GERD (gastroesophageal reflux disease)    Paroxysmal atrial fibrillation ([place]) 04/19/2021   ZIO monitor-2022- approximately 10 minutes atrial fibrillation rapid ventricular response    Past Surgical History:  Procedure Laterality Date   CHOLECYSTECTOMY     MEDIASTERNOTOMY N/A 05/10/2021   Procedure: MEDIAN STERNOTOMY;  Surgeon: Modesto Charon  C, MD;  Location: [place];  Service: Open Heart Surgery;  Laterality: N/A;   MINIMALLY INVASIVE EXCISION OF ATRIAL MYXOMA Right 05/10/2021   Procedure: MINIMALLY INVASIVE EXCISION OF ATRIAL MYXOMA;  Surgeon: Melrose Nakayama, MD;  Location: [place];  Service: Open Heart Surgery;  Laterality: Right;   MINIMALLY INVASIVE MAZE PROCEDURE N/A 05/10/2021   Procedure: PARTIAL CRYOMAZE PROCEDURE;  Surgeon: Melrose Nakayama, MD;  Location: [place];  Service: Open Heart  Surgery;  Laterality: N/A;   TEE WITHOUT CARDIOVERSION  05/10/2021   Procedure: TRANSESOPHAGEAL ECHOCARDIOGRAM (TEE);  Surgeon: Melrose Nakayama, MD;  Location: [facility] OR;  Service: Open Heart Surgery;;    Current Medications: Current Meds  Medication Sig   apixaban (ELIQUIS) 5 MG TABS tablet Take 1 tablet (5 mg total) by mouth 2 (two) times daily.   aspirin EC 81 MG tablet Take 1 tablet (81 mg total) by mouth daily. Swallow whole.   metoprolol tartrate (LOPRESSOR) 25 MG tablet Take 0.5 tablets (12.5 mg total) by mouth 2 (two) times daily.   mometasone-formoterol (DULERA) 100-5 MCG/ACT AERO Inhale 2 puffs into the lungs 2 (two) times daily as needed (Asthma like symptoms).   pantoprazole (PROTONIX) 20 MG tablet Take 20 mg by mouth See admin instructions. Take 20 mg daily, may take a second 20 mg dose as needed for heartburn   [DISCONTINUED] amiodarone (PACERONE) 200 MG tablet Take 1 tablet (200 mg total) by mouth 2 (two) times daily. For 10 days then take 1 tablet (200 mg) daily thereafter (Patient taking differently: Take 200 mg by mouth daily.)     Allergies:   Patient has no known allergies.   Social History   Socioeconomic History   Marital status: Married    Spouse name: Not on file   Number of children: Not on file   Years of education: Not on file   Highest education level: Professional school degree (e.g., MD, DDS, DVM, JD)  Occupational History   Not on file  Tobacco Use   Smoking status: Never   Smokeless tobacco: Never   Tobacco comments:    1 can per 1.5 weeks  Vaping Use   Vaping Use: Never used  Substance and Sexual Activity   Alcohol use: Not Currently    Comment: rare   Drug use: Never   Sexual activity: Not on file  Other Topics Concern   Not on file  Social History Narrative   Not on file   Social Determinants of Health   Financial Resource Strain: Not on file  Food Insecurity: Not on file  Transportation Needs: Not on file  Physical Activity: Not on  file  Stress: Not on file  Social Connections: Not on file     Family History: The patient's family history includes Diabetes in his father; Hypertension in his mother. There is no history of Heart attack, Hyperlipidemia, or Sudden death.  ROS:   Please see the history of present illness.     (+) Palpitations (+) Sudden daytime somnolence (+) Over-sensitivity of CABG scar (+) Muscular chest pain when lying on his side  All other systems reviewed and are negative.  EKGs/Labs/Other Studies Reviewed:    The following studies were reviewed today:  Echo Intraoperative Tee 05/10/21:  POST-OP IMPRESSIONS  _ Left Ventricle: The left ventricle is unchanged from pre-bypass.  _ Right Ventricle: The right ventricle appears unchanged from pre-bypass.  _ Aorta: The aorta appears unchanged from pre-bypass.     _ Aortic Valve: The aortic  valve appears unchanged from pre-bypass.  _ Mitral Valve: The mitral valve appears unchanged from pre-bypass.  _ Tricuspid Valve: The tricuspid valve appears unchanged from pre-bypass.  _ Pulmonic Valve: The pulmonic valve appears unchanged from pre-bypass.  _ Interventricular Septum: The interventricular septum appears unchanged  from  pre-bypass.  _ Pericardium: The pericardium appears unchanged from pre-bypass.  _ Comments: Interval removal of left atrial mass. Color flow doppler does  not  reveal any flow across interatrial septum after mass removal and  pericardial  patch of interatrial septum.   Cardiac MRI 04/14/21:  FINDINGS: 1. Normal left ventricular size, with LVEDD 53 mm, and LVEDVi 56 mL/m2.   Normal left ventricular thickness, with intraventricular septal thickness of 9 mm, posterior wall thickness of 8 mm, and myocardial mass index of 45 g/m2.   Normal left ventricular systolic function (LVEF =25%). There are no regional wall motion abnormalities.   Left ventricular parametric mapping notable for normal T1 and T2. No recent HCT for  ECV analysis.   There is late gadolinium enhancement in the left ventricular myocardium: There is basal anterior mid myocardial LGE. This does not meet a 5 Standard deviation criteria.   2. Normal right ventricular size with RVEDVI 67 mL/m2.   Normal right ventricular thickness.   Normal right ventricular systolic function (RVEF = 51%). There are no regional wall motion abnormalities or aneurysms.   3.  Normal left atrial size and mild right atrial enlargement.   There is a left atrial mass stuck on to the mid-mid portion of the left atrium at the fossa ovalis.   Dimensions 23 mm X 23 mm X 25 mm.   Lesion is mobile but does not pass through the mitral valve.   There is heterogeneous intermediate T1 signal but hyperintense T2 signal.   This is consistent with a left atrial myxoma   4. Normal size of the aortic root, ascending aorta and pulmonary artery.   5. Valve assessment:   Aortic Valve: Tri-leaflet aortic valve. Qualitatively no significant regurgitation.   Pulmonic Valve: Qualitatively no significant regurgitation.   Tricuspid Valve: Qualitatively no significant regurgitation.   Mitral Valve: No significant regurgitation.   6. Normal pericardium. No pericardial effusion. Prominent pericardial fat.   7. Grossly, no extracardiac findings. Recommended dedicated study if concerned for non-cardiac pathology.   8.  Wrap around artifact noted.   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   Thre is basal anterior mid myocardial LGE. Though not consistent with cardiac sarcoidosis, may be a foci for PVCs.  CT Coronary Morph 03/20/21:  FINDINGS: Minimal streaky volume loss in the lung bases. No acute extracardiac findings.   IMPRESSION: No acute extracardiac findings.  Long Term Monitor 03/13/21:  Sinus rhythm, average heartbeat 75 bpm. 15 atrial tachycardia runs longest 15 beats with an average of 116 bpm. Atrial bigeminy noted, PVCs,  couplets, triplets noted Brief episode of atrial fibrillation with rapid ventricular response noted 9 minutes and 15 seconds with heart rate ranging 132 to 213 bpm. Average was 168. During that episode there were a few short runs of wide-complex tachycardia noted, some appeared to be following a long short pattern of aberancy. There was another episode on 03/06/2021 at 11:30 AM while in normal sinus rhythm with frequent PACs, PVCs, triplets in rapid sequence. Brief episodes of ventricular tachycardia.   Prior office notes reviewed.  Lab work reviewed, sodium 138 potassium 4.1 creatinine 1.1.  Total cholesterol 180 triglycerides 95 HDL 44 LDL 133.  EKG:   EKG is personally reviewed.  09/08/21: EKG was not ordered.   02/21/21: 67 bpm with no other abnormalities.  Normal intervals.  During 1 episode while at the barn, apple watch EKG showed sinus rhythm with frequent PVCs.  Recent Labs: 05/08/2021: ALT 53 05/18/2021: Magnesium 2.1 05/20/2021: Hemoglobin 9.0; Platelets 459 06/15/2021: BUN 12; Creatinine, Ser 1.13; Potassium 4.5; Sodium 137  Recent Lipid Panel No results found for: CHOL, TRIG, HDL, CHOLHDL, VLDL, LDLCALC, LDLDIRECT   Risk Assessment/Calculations:   STOP-Bang Score:  5           Physical Exam:    VS:  BP 102/64 (BP Location: Left Arm, Patient Position: Sitting, Cuff Size: Normal)   Pulse 60   Ht 5' 9.5" (1.765 m)   Wt 221 lb (100.2 kg)   SpO2 97%   BMI 32.17 kg/m     Wt Readings from Last 3 Encounters:  09/08/21 221 lb (100.2 kg)  08/15/21 225 lb 6.4 oz (102.2 kg)  07/26/21 227 lb 1.2 oz (103 kg)     GEN:  Well nourished, well developed in no acute distress HEENT: Normal NECK: No JVD; No carotid bruits LYMPHATICS: No lymphadenopathy CARDIAC: RRR, no murmurs, no rubs, gallops; CABG scar well healed. RESPIRATORY:  Clear to auscultation without rales, wheezing or rhonchi  ABDOMEN: Soft, non-tender, non-distended MUSCULOSKELETAL:  No edema; No deformity  SKIN:  Warm and dry NEUROLOGIC:  Alert and oriented x 3 PSYCHIATRIC:  Normal affect   ASSESSMENT:    1. Snoring   2. Atrial myxoma   3. Paroxysmal atrial fibrillation (HCC)   4. Coronary artery disease involving native coronary artery of native heart without angina pectoris   5. Daytime somnolence     PLAN:    In order of problems listed above:  Atrial myxoma Postresection, Dr. Roxan Hockey.  Doing well.  Paroxysmal atrial fibrillation (HCC) Post Maze procedure, left atrial appendage clipping.  We will stop amiodarone.  Long half-life.  Discussed today in clinic.  We will continue with Eliquis for now.  At approximately 1 year postsurgery we we will consider a ZIO monitor once again.  If atrial fibrillation undetected, may be able to come off of Eliquis.  He will have an occasional PAC noted.  Continue with low-dose metoprolol.  Coronary artery disease involving native coronary artery of native heart without angina pectoris Minimal coronary plaque LAD.  At future date consider Crestor for further stabilization.  Daytime somnolence We will go ahead and set up for sleep study.  This to be a great idea given his previous atrial fibrillation as well.  He notes that he occasionally will be working on the computer and the next thing he knows he is falling asleep.  Daytime somnolence.  Snoring has been heard in the past.   Follow up: 6 months   Medication Adjustments/Labs and Tests Ordered: Current medicines are reviewed at length with the patient today.  Concerns regarding medicines are outlined above.  Orders Placed This Encounter  Procedures   Split night study   No orders of the defined types were placed in this encounter.   Patient Instructions  Medication Instructions:  Please discontinue your Amiodarone. Continue all other medications as listed.  *If you need a refill on your cardiac medications before your next appointment, please call your  pharmacy*  Testing/Procedures: Your physician has recommended that you have a sleep study. This test records several body functions during sleep, including: brain activity, eye movement, oxygen and carbon dioxide blood  levels, heart rate and rhythm, breathing rate and rhythm, the flow of air through your mouth and nose, snoring, body muscle movements, and chest and belly movement.  Follow-Up: At [facility], you and your health needs are our priority.  As part of our continuing mission to provide you with exceptional heart care, we have created designated Provider Care Teams.  These Care Teams include your primary Cardiologist (physician) and Advanced Practice Providers (APPs -  Physician Assistants and Nurse Practitioners) who all work together to provide you with the care you need, when you need it.  We recommend signing up for the patient portal called "MyChart".  Sign up information is provided on this After Visit Summary.  MyChart is used to connect with patients for Virtual Visits (Telemedicine).  Patients are able to view lab/test results, encounter notes, upcoming appointments, etc.  Non-urgent messages can be sent to your provider as well.   To learn more about what you can do with MyChart, go to [email or web address].    Your next appointment:   6 month(s)  The format for your next appointment:   In Person  Provider:   [name], MD {   Important Information About Sugar          I,Mathew Stumpf,acting as a scribe for [name], MD.,have documented all relevant documentation on the behalf of [name], MD,as directed by  [name], MD while in the presence of [name], MD.   I, [name], MD, have reviewed all documentation for this visit. The documentation on 09/08/21 for the exam, diagnosis, procedures, and orders are all accurate and complete.    Signed, [name], MD  09/08/2021 10:39 AM    [facility]

## 2021-09-08 NOTE — Assessment & Plan Note (Signed)
We will go ahead and set up for sleep study.  This to be a great idea given his previous atrial fibrillation as well.  He notes that he occasionally will be working on the computer and the next thing he knows he is falling asleep.  Daytime somnolence.  Snoring has been heard in the past.

## 2021-09-08 NOTE — Telephone Encounter (Signed)
Prior Authorization for Split night sleep study sent to [employer] via Phone. Reference # [id number].

## 2021-09-08 NOTE — Assessment & Plan Note (Addendum)
Post Maze procedure, left atrial appendage clipping.  We will stop amiodarone.  Long half-life.  Discussed today in clinic.  We will continue with Eliquis for now.  At approximately 1 year postsurgery we we will consider a ZIO monitor once again.  If atrial fibrillation undetected, may be able to come off of Eliquis.  He will have an occasional PAC noted.  Continue with low-dose metoprolol.

## 2021-09-08 NOTE — Assessment & Plan Note (Signed)
Minimal coronary plaque LAD.  At future date consider Crestor for further stabilization.

## 2021-09-11 ENCOUNTER — Encounter (HOSPITAL_COMMUNITY)
Admission: RE | Admit: 2021-09-11 | Discharge: 2021-09-11 | Disposition: A | Discharge status: Home / Self Care | Payer: Federal, State, Local not specified - PPO | Source: Ambulatory Visit | Attending: Cardiology | Admitting: Cardiology

## 2021-09-11 DIAGNOSIS — D151 Benign neoplasm of heart: Secondary | ICD-10-CM

## 2021-09-12 NOTE — Progress Notes (Signed)
Cardiac Individual Treatment Plan  Patient Details  Name: Shawn Suarez MRN: [id number] Date of Birth: Sep 29, 1965 Referring Provider:   Flowsheet Row CARDIAC REHAB PHASE II ORIENTATION from 07/20/2021 in [place]  Referring Provider Dr. 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 Swallow whole., Disp: 90 tablet, Rfl: 3   metoprolol tartrate (LOPRESSOR) 25 MG tablet, Take 0.5 tablets (12.5 mg total) by mouth 2 (two) times daily., Disp: 90 tablet, Rfl: 2   mometasone-formoterol (DULERA) 100-5 MCG/ACT AERO, Inhale 2 puffs into the lungs 2 (two) times daily as needed (Asthma like symptoms)., Disp: , Rfl:    pantoprazole (PROTONIX) 20 MG tablet, Take 20 mg by mouth See admin instructions. 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   ZIO monitor 2022-symptomatic   GERD (gastroesophageal reflux disease)    Paroxysmal atrial fibrillation (HCC) 04/19/2021   ZIO monitor-2022- approximately 10 minutes atrial fibrillation rapid ventricular response     Tobacco Use: Social History   Tobacco Use  Smoking Status Never  Smokeless Tobacco Never  Tobacco Comments   1 can per 1.5 weeks    Labs: Review Flowsheet        Latest Ref Rng & Units 05/08/2021 05/10/2021  Labs for ITP Cardiac and Pulmonary Rehab  Hemoglobin A1c 4.8 - 5.6 % 6.3     PH, Arterial 7.350 - 7.450 7.415   7.325     7.337     7.299     7.373     7.293     7.355     7.392    PCO2 arterial 32.0 - 48.0 mmHg 37.0   45.3     41.2     45.8     44.2     55.3     48.6     38.1    Bicarbonate 20.0 - 28.0 mmol/L 23.3   23.5     22.0     22.7     25.7     26.7     25.7     27.1     23.2    TCO2 22 - 32 mmol/L  $Remov'25     23     24     25     27     28     27     28     29     24     22     'urAsxC$ 24  Acid-base deficit 0.0 - 2.0 mmol/L 0.7   2.0     3.0     4.0     1.0     1.0    O2 Saturation % 97.0   97.0     98.0     56.4     98.0     100.0     100.0     84.0     100.0     99.0        Multiple values from one day are sorted in reverse-chronological order        Capillary Blood Glucose: Lab Results  Component Value Date   GLUCAP 112 (H) 05/15/2021   GLUCAP 96 05/15/2021   GLUCAP 111 (H) 05/15/2021   GLUCAP 96 05/15/2021   GLUCAP 93 05/14/2021     Exercise Target Goals: Exercise Program Goal: Individual exercise prescription set using results from initial 6 min walk test and THRR while considering  patient's activity barriers and safety.   Exercise Prescription Goal: Initial exercise prescription builds to 30-45 minutes a day of aerobic activity, 2-3 days per week.  Home exercise guidelines will be given to patient during program as part of exercise prescription that the participant will acknowledge.  Activity Barriers & Risk Stratification:  Activity Barriers & Cardiac Risk Stratification - 07/20/21 0913       Activity Barriers & Cardiac Risk Stratification   Activity Barriers None    Cardiac Risk Stratification High   Under  5 MET's            6 Minute Walk:  6 Minute Walk     Row Name 07/20/21 0911 09/13/21 0830       6 Minute Walk   Phase Initial Discharge    Distance 1745 feet 2160 feet    Distance % Change -- 23.78 %    Distance Feet Change -- 415 ft    Walk Time 6 minutes 6 minutes    # of Rest Breaks 0 0    MPH 3.3 4.09    METS 3.68 4.78    RPE 10 12    Perceived Dyspnea  0 0    VO2 Peak 12.9 16.74    Symptoms Yes (comment) No    Comments calf tighness. Relieved with rest. --    Resting HR 69 bpm 66 bpm    Resting BP 104/70 108/60    Resting Oxygen Saturation  97 % 97 %    Exercise Oxygen Saturation  during 6 min walk 98 % 97 %    Max Ex. HR 78 bpm 446 bpm    Max Ex. BP 112/60 142/66    2 Minute Post BP 102/62 --             Oxygen Initial Assessment:   Oxygen Re-Evaluation:   Oxygen Discharge (Final Oxygen Re-Evaluation):   Initial Exercise Prescription:  Initial Exercise Prescription - 07/20/21 0900       Date of Initial Exercise RX and Referring Provider   Date 07/20/21    Referring Provider Dr. [name]    Expected Discharge Date 09/15/21      Treadmill   MPH 2.7    Grade 1    Minutes 15    METs 3.44      Bike   Level 1.2    Minutes 15    METs 3.18      Prescription Details   Frequency (times per week) 3    Duration Progress  to 30 minutes of continuous aerobic without signs/symptoms of physical distress      Intensity   THRR 40-80% of Max Heartrate 65-130    Ratings of Perceived Exertion 11-13    Perceived Dyspnea 0-4      Progression   Progression Continue progressive overload as per policy without signs/symptoms or physical distress.      Resistance Training   Training Prescription Yes    Weight 3    Reps 10-15             Perform Capillary Blood Glucose checks as needed.  Exercise Prescription Changes:   Exercise Prescription Changes     Row Name 07/24/21 0830 08/07/21 0830 08/21/21 0830 09/08/21 0830       Response to  Exercise   Blood Pressure (Admit) 110/70 1$RemoveBeforeD'04/62 98/60 98/60 'CuBXrXCEHFcVLC$    Blood Pressure (Exercise) 140/80 142/82 130/70 142/76    Blood Pressure (Exit) 104/64 98/61 100/62 98/60    Heart Rate (Admit) 68 bpm 67 bpm 60 bpm 68 bpm    Heart Rate (Exercise) 94 bpm 105 bpm 88 bpm 119 bpm    Heart Rate (Exit) 75 bpm 71 bpm 66 bpm 78 bpm    Rating of Perceived Exertion (Exercise) 11.5 10.5 11.5 12.3    Perceived Dyspnea (Exercise) 0 0 0 0    Symptoms 0 0 0 0    Comments Pt first day in the CRP2 program Reviewed MET's, goals and home Reviewed MET's Reviewed MET's and goals    Duration Progress to 30 minutes of  aerobic without signs/symptoms of physical distress Progress to 30 minutes of  aerobic without signs/symptoms of physical distress Progress to 30 minutes of  aerobic without signs/symptoms of physical distress Progress to 30 minutes of  aerobic without signs/symptoms of physical distress    Intensity THRR unchanged THRR unchanged THRR unchanged THRR unchanged      Progression   Progression Continue to progress workloads to maintain intensity without signs/symptoms of physical distress. Continue to progress workloads to maintain intensity without signs/symptoms of physical distress. Continue to progress workloads to maintain intensity without signs/symptoms of physical distress. Continue to progress workloads to maintain intensity without signs/symptoms of physical distress.    Average METs 3.45 4.29 4.32 5.42      Resistance Training   Training Prescription Yes Yes Yes Yes    Weight $Remov'3 5 5 5    'TQhZda$ Reps 10-15 10-15 10-15 10-15    Time 10 Minutes 10 Minutes 10 Minutes 10 Minutes      Interval Training   Interval Training -- -- -- Yes    Equipment -- -- -- Bike      Treadmill   MPH 3 3.4 3.4 3.6    Grade 1 1 1.5 2    Minutes $Remove'15 15 15 15    'lfQEgno$ METs 3.71 4.07 4.31 4.75      Bike   Level 1.2 1.9 1.8 2.8  INT training 2.8 level for 6 mins, 2.1 level (5.28 MET)  9 mins    Minutes $Remove'15 15 15 15    'vpxzLKW$ METs 3.18  4.51 4.33 6.24      Home Exercise Plan   Plans to continue exercise at -- [employer] (comment) Forensic scientist (comment) Forensic scientist (comment)    Frequency -- Add 3 additional days to program exercise sessions. Add 3 additional days to program exercise sessions. Add 3 additional days to program exercise sessions.    Initial Home Exercises Provided -- 08/07/21 08/07/21 08/07/21  Exercise Comments:   Exercise Comments     Row Name 07/24/21 0830 08/07/21 0830 08/21/21 0919 09/08/21 0830     Exercise Comments Pt first day in the CRP2 program. Pt tolerated exercise well with an average MET level of 3.45. Pt is learning his THRR, RPE and ExRx Reviewed MET's, goals and home ExRx. Pt tolerated exercise well with an average MET level of 4.29. Pt will exercise on his own by walking, using bands and [employer] (reviewed sternal precautions for types of exercises at PF) for 3 days a week for 30-45 mins per session. Reviewed MET's with pt today. Pt tolerated exercise well with an average MET level of 4.32. Pt has started going to the gym on his own and feels good about his progress so far. Will continue to monitor pt and progress workloads as tolerated without sign or symptom Reviewed MET's and goals. Pt tolerated exercise well with an average MET level of 5.42. Pt feels great about his goals. He is able to accomplish all of his goals. Strength, easy of ADL's and golf are all going very well for him and he is able to enjoy life more. Pt feels like he has gained strength and endurance as well and enjoys being able to interval train. Overall pt is doing very well and is feeling much better             Exercise Goals and Review:   Exercise Goals     Row Name 07/20/21 0919             Exercise Goals   Increase Physical Activity Yes       Intervention Provide advice, education, support and counseling about physical activity/exercise needs.;Develop an  individualized exercise prescription for aerobic and resistive training based on initial evaluation findings, risk stratification, comorbidities and participant's personal goals.       Expected Outcomes Short Term: Attend rehab on a regular basis to increase amount of physical activity.;Long Term: Exercising regularly at least 3-5 days a week.;Long Term: Add in home exercise to make exercise part of routine and to increase amount of physical activity.       Increase Strength and Stamina Yes       Intervention Provide advice, education, support and counseling about physical activity/exercise needs.;Develop an individualized exercise prescription for aerobic and resistive training based on initial evaluation findings, risk stratification, comorbidities and participant's personal goals.       Expected Outcomes Short Term: Increase workloads from initial exercise prescription for resistance, speed, and METs.;Short Term: Perform resistance training exercises routinely during rehab and add in resistance training at home;Long Term: Improve cardiorespiratory fitness, muscular endurance and strength as measured by increased METs and functional capacity (6MWT)       Able to understand and use rate of perceived exertion (RPE) scale Yes       Intervention Provide education and explanation on how to use RPE scale       Expected Outcomes Short Term: Able to use RPE daily in rehab to express subjective intensity level;Long Term:  Able to use RPE to guide intensity level when exercising independently       Knowledge and understanding of Target Heart Rate Range (THRR) Yes       Intervention Provide education and explanation of THRR including how the numbers were predicted and where they are located for reference       Expected Outcomes Short Term: Able to state/look up THRR;Long Term: Able to use THRR to  govern intensity when exercising independently;Short Term: Able to use daily as guideline for intensity in rehab        Understanding of Exercise Prescription Yes       Intervention Provide education, explanation, and written materials on patient's individual exercise prescription       Expected Outcomes Short Term: Able to explain program exercise prescription;Long Term: Able to explain home exercise prescription to exercise independently                Exercise Goals Re-Evaluation :  Exercise Goals Re-Evaluation     Row Name 07/24/21 0830 08/07/21 0830 09/08/21 0830         Exercise Goal Re-Evaluation   Exercise Goals Review Increase Physical Activity;Increase Strength and Stamina;Able to understand and use rate of perceived exertion (RPE) scale;Knowledge and understanding of Target Heart Rate Range (THRR);Understanding of Exercise Prescription Increase Physical Activity;Increase Strength and Stamina;Able to understand and use rate of perceived exertion (RPE) scale;Knowledge and understanding of Target Heart Rate Range (THRR);Understanding of Exercise Prescription Increase Physical Activity;Increase Strength and Stamina;Able to understand and use rate of perceived exertion (RPE) scale;Knowledge and understanding of Target Heart Rate Range (THRR);Understanding of Exercise Prescription     Comments Pt first day in the CRP2 program. Pt tolerated exercise well with an average MET level of 3.45. Pt is learning his THRR, RPE and ExRx Reviewed MET's, goals and home ExRx. Pt tolerated exercise well with an average MET level of 4.29. Pt will exercise on his own by walking, using bands and [employer] (reviewed sternal precautions for types of exercises at PF) for 3 days a week for 30-45 mins per session. Reviewed MET's and goals. Pt tolerated exercise well with an average MET level of 5.42. Pt feels great about his goals. He is able to accomplish all of his goals. Strength, easy of ADL's and golf are all going very well for him and he is able to enjoy life more. Pt feels like he has gained strength and endurance as  well and enjoys being able to interval train. Overall pt is doing very well and is feeling much better     Expected Outcomes Will continue to monitor pt and progress workloads as tolerated without sign or symptom Pt will continue to exercise on his own. Will continue to monitor pt and progress workloads as tolerated without sign or symptom. Will continue to monitor pt and progress workloads as tolerated without sign or symptom.              Discharge Exercise Prescription (Final Exercise Prescription Changes):  Exercise Prescription Changes - 09/08/21 0830       Response to Exercise   Blood Pressure (Admit) 98/60    Blood Pressure (Exercise) 142/76    Blood Pressure (Exit) 98/60    Heart Rate (Admit) 68 bpm    Heart Rate (Exercise) 119 bpm    Heart Rate (Exit) 78 bpm    Rating of Perceived Exertion (Exercise) 12.3    Perceived Dyspnea (Exercise) 0    Symptoms 0    Comments Reviewed MET's and goals    Duration Progress to 30 minutes of  aerobic without signs/symptoms of physical distress    Intensity THRR unchanged      Progression   Progression Continue to progress workloads to maintain intensity without signs/symptoms of physical distress.    Average METs 5.42      Resistance Training   Training Prescription Yes    Weight 5    Reps  10-15    Time 10 Minutes      Interval Training   Interval Training Yes    Equipment Bike      Treadmill   MPH 3.6    Grade 2    Minutes 15    METs 4.75      Bike   Level 2.8   INT training 2.8 level for 6 mins, 2.1 level (5.28 MET)  9 mins   Minutes 15    METs 6.24      Home Exercise Plan   Plans to continue exercise at [employer] (comment)    Frequency Add 3 additional days to program exercise sessions.    Initial Home Exercises Provided 08/07/21             Nutrition:  Target Goals: Understanding of nutrition guidelines, daily intake of sodium '1500mg'$ , cholesterol '200mg'$ , calories 30% from fat and 7% or less from  saturated fats, daily to have 5 or more servings of fruits and vegetables.  Biometrics:  Pre Biometrics - 07/20/21 0923       Pre Biometrics   Height 5' 9.5" (1.765 m)    Weight 229 lb 0.9 oz (103.9 kg)    Waist Circumference 44.5 inches    Hip Circumference 42.5 inches    Waist to Hip Ratio 1.05 %    BMI (Calculated) 33.35    Triceps Skinfold 25 mm    % Body Fat 33.4 %    Grip Strength 44 kg    Flexibility 10 in    Single Leg Stand 30 seconds              Nutrition Therapy Plan and Nutrition Goals:  Nutrition Therapy & Goals - 09/08/21 0807       Nutrition Therapy   Diet Heart healthy diet      Personal Nutrition Goals   Nutrition Goal Patitent to identify food quantities and strategies necessary to achieve weight loss of 0.5-2.0# per week.    Personal Goal #2 Patient to describe the benefit of including fruits/vegetables, whole grains, low fat dairy, lean protein/plant protein as part of a heart healthy meal plan.    Personal Goal #3 Patient to limit food sources of saturated fats, trans fats, refined carbohydrates, and sodium.    Comments Discussed high protein/high fiber snack ideas and mindful eating strategies for snacking at night. Handout given.      Intervention Plan   Intervention Nutrition handout(s) given to patient.    Expected Outcomes Long Term Goal: Adherence to prescribed nutrition plan.;Short Term Goal: A plan has been developed with personal nutrition goals set during dietitian appointment.             Nutrition Assessments:  Nutrition Assessments - 09/13/21 1153       Rate Your Plate Scores   Post Score 81            MEDIFICTS Score Key: ?70 Need to make dietary changes  40-70 Heart Healthy Diet ? 40 Therapeutic Level Cholesterol Diet   Flowsheet Row CARDIAC REHAB PHASE II EXERCISE from 07/26/2021 in [place]  Picture Your Plate Total Score on Admission 81      Picture Your Plate Scores: <00  Unhealthy dietary pattern with much room for improvement. 41-50 Dietary pattern unlikely to meet recommendations for good health and room for improvement. 51-60 More healthful dietary pattern, with some room for improvement.  >60 Healthy dietary pattern, although there may be some specific behaviors  that could be improved.    Nutrition Goals Re-Evaluation:   Nutrition Goals Re-Evaluation:   Nutrition Goals Discharge (Final Nutrition Goals Re-Evaluation):   Psychosocial: Target Goals: Acknowledge presence or absence of significant depression and/or stress, maximize coping skills, provide positive support system. Participant is able to verbalize types and ability to use techniques and skills needed for reducing stress and depression.  Initial Review & Psychosocial Screening:  Initial Psych Review & Screening - 07/20/21 0826       Initial Review   Current issues with History of Depression   History of PTSD, Served in [place], Received counselling in the past. Not currently depressed     Family Dynamics   Good Support System? Yes   Laksh has his wife and tow children for support     Barriers   Psychosocial barriers to participate in program There are no identifiable barriers or psychosocial needs.      Screening Interventions   Interventions Encouraged to exercise             Quality of Life Scores:  Quality of Life - 07/20/21 0932       Quality of Life   Select Quality of Life      Quality of Life Scores   Health/Function Pre 25.83 %    Socioeconomic Pre 29.64 %    Psych/Spiritual Pre 25.71 %    Family Pre 30 %    GLOBAL Pre 27.21 %            Scores of 19 and below usually indicate a poorer quality of life in these areas.  A difference of  2-3 points is a clinically meaningful difference.  A difference of 2-3 points in the total score of the Quality of Life Index has been associated with significant improvement in overall quality of life, self-image, physical  symptoms, and general health in studies assessing change in quality of life.  PHQ-9: Review Flowsheet        07/20/2021  Depression screen PHQ 2/9  Decreased Interest 0  Down, Depressed, Hopeless 0  PHQ - 2 Score 0         Interpretation of Total Score  Total Score Depression Severity:  1-4 = Minimal depression, 5-9 = Mild depression, 10-14 = Moderate depression, 15-19 = Moderately severe depression, 20-27 = Severe depression   Psychosocial Evaluation and Intervention:  Psychosocial Evaluation - 08/14/21 1414       Psychosocial Evaluation & Interventions   Comments Zigmond is well adjusted after his cardiac event.  Amrom feels supported by family and friends.  Denies any Psychosocial barriers.             Psychosocial Re-Evaluation:  Psychosocial Re-Evaluation     [employer] Name 08/14/21 1413 08/14/21 1414 09/12/21 1002         Psychosocial Re-Evaluation   Current issues with None Identified -- None Identified     Comments -- Easten continues to deny any psychosocial barriers.  Feels very supported by his family and friends. Alder continues to deny any psychosocial barriers.  Feels very supported by his family and friends. Antony Haste interacts well with staff and fellow participants offering words of encouragement.     Expected Outcomes -- Aaryav will continue to deny any psychosocial barriers and continue to display positive and healthy coping skills. Roshawn will continue to deny any psychosocial barriers and continue to display positive and healthy coping skills.     Interventions -- Encouraged to attend Cardiac Rehabilitation for the exercise  Encouraged to attend Cardiac Rehabilitation for the exercise     Continue Psychosocial Services  -- No Follow up required No Follow up required              Psychosocial Discharge (Final Psychosocial Re-Evaluation):  Psychosocial Re-Evaluation - 09/12/21 1002       Psychosocial Re-Evaluation   Current issues with None Identified    Comments  Travelle continues to deny any psychosocial barriers.  Feels very supported by his family and friends. Antony Haste interacts well with staff and fellow participants offering words of encouragement.    Expected Outcomes Davonn will continue to deny any psychosocial barriers and continue to display positive and healthy coping skills.    Interventions Encouraged to attend Cardiac Rehabilitation for the exercise    Continue Psychosocial Services  No Follow up required             Vocational Rehabilitation: Provide vocational rehab assistance to qualifying candidates.   Vocational Rehab Evaluation & Intervention:  Vocational Rehab - 07/20/21 1056       Initial Vocational Rehab Evaluation & Intervention   Assessment shows need for Vocational Rehabilitation No   Tyshan will return to work on Monday 0417/23            Education: Education Goals: Education classes will be provided on a weekly basis, covering required topics. Participant will state understanding/return demonstration of topics presented.  Learning Barriers/Preferences:  Learning Barriers/Preferences - 07/20/21 0936       Learning Barriers/Preferences   Learning Barriers Sight   pt wears glasses   Learning Preferences Group Instruction;Individual Instruction;Skilled Demonstration;Video;Computer/Internet             Education Topics: Count Your Pulse:  -Group instruction provided by verbal instruction, demonstration, patient participation and written materials to support subject.  Instructors address importance of being able to find your pulse and how to count your pulse when at home without a heart monitor.  Patients get hands on experience counting their pulse with staff help and individually.   Heart Attack, Angina, and Risk Factor Modification:  -Group instruction provided by verbal instruction, video, and written materials to support subject.  Instructors address signs and symptoms of angina and heart attacks.    Also  discuss risk factors for heart disease and how to make changes to improve heart health risk factors.   Functional Fitness:  -Group instruction provided by verbal instruction, demonstration, patient participation, and written materials to support subject.  Instructors address safety measures for doing things around the house.  Discuss how to get up and down off the floor, how to pick things up properly, how to safely get out of a chair without assistance, and balance training.   Meditation and Mindfulness:  -Group instruction provided by verbal instruction, patient participation, and written materials to support subject.  Instructor addresses importance of mindfulness and meditation practice to help reduce stress and improve awareness.  Instructor also leads participants through a meditation exercise.    Stretching for Flexibility and Mobility:  -Group instruction provided by verbal instruction, patient participation, and written materials to support subject.  Instructors lead participants through series of stretches that are designed to increase flexibility thus improving mobility.  These stretches are additional exercise for major muscle groups that are typically performed during regular warm up and cool down.   Hands Only CPR:  -Group verbal, video, and participation provides a basic overview of AHA guidelines for community CPR. Role-play of emergencies allow participants the opportunity to practice  calling for help and chest compression technique with discussion of AED use.   Hypertension: -Group verbal and written instruction that provides a basic overview of hypertension including the most recent diagnostic guidelines, risk factor reduction with self-care instructions and medication management.    Nutrition I class: Heart Healthy Eating:  -Group instruction provided by PowerPoint slides, verbal discussion, and written materials to support subject matter. The instructor gives an  explanation and review of the Therapeutic Lifestyle Changes diet recommendations, which includes a discussion on lipid goals, dietary fat, sodium, fiber, plant stanol/sterol esters, sugar, and the components of a well-balanced, healthy diet.   Nutrition II class: Lifestyle Skills:  -Group instruction provided by PowerPoint slides, verbal discussion, and written materials to support subject matter. The instructor gives an explanation and review of label reading, grocery shopping for heart health, heart healthy recipe modifications, and ways to make healthier choices when eating out.   Diabetes Question & Answer:  -Group instruction provided by PowerPoint slides, verbal discussion, and written materials to support subject matter. The instructor gives an explanation and review of diabetes co-morbidities, pre- and post-prandial blood glucose goals, pre-exercise blood glucose goals, signs, symptoms, and treatment of hypoglycemia and hyperglycemia, and foot care basics.   Diabetes Blitz:  -Group instruction provided by PowerPoint slides, verbal discussion, and written materials to support subject matter. The instructor gives an explanation and review of the physiology behind type 1 and type 2 diabetes, diabetes medications and rational behind using different medications, pre- and post-prandial blood glucose recommendations and Hemoglobin A1c goals, diabetes diet, and exercise including blood glucose guidelines for exercising safely.    Portion Distortion:  -Group instruction provided by PowerPoint slides, verbal discussion, written materials, and food models to support subject matter. The instructor gives an explanation of serving size versus portion size, changes in portions sizes over the last 20 years, and what consists of a serving from each food group.   Stress Management:  -Group instruction provided by verbal instruction, video, and written materials to support subject matter.  Instructors  review role of stress in heart disease and how to cope with stress positively.     Exercising on Your Own:  -Group instruction provided by verbal instruction, power point, and written materials to support subject.  Instructors discuss benefits of exercise, components of exercise, frequency and intensity of exercise, and end points for exercise.  Also discuss use of nitroglycerin and activating EMS.  Review options of places to exercise outside of rehab.  Review guidelines for sex with heart disease.   Cardiac Drugs I:  -Group instruction provided by verbal instruction and written materials to support subject.  Instructor reviews cardiac drug classes: antiplatelets, anticoagulants, beta blockers, and statins.  Instructor discusses reasons, side effects, and lifestyle considerations for each drug class.   Cardiac Drugs II:  -Group instruction provided by verbal instruction and written materials to support subject.  Instructor reviews cardiac drug classes: angiotensin converting enzyme inhibitors (ACE-I), angiotensin II receptor blockers (ARBs), nitrates, and calcium channel blockers.  Instructor discusses reasons, side effects, and lifestyle considerations for each drug class.   Anatomy and Physiology of the Circulatory System:  Group verbal and written instruction and models provide basic cardiac anatomy and physiology, with the coronary electrical and arterial systems. Review of: AMI, Angina, Valve disease, Heart Failure, Peripheral Artery Disease, Cardiac Arrhythmia, Pacemakers, and the ICD.   Other Education:  -Group or individual verbal, written, or video instructions that support the educational goals of the cardiac rehab program.  Holiday Eating Survival Tips:  -Group instruction provided by PowerPoint slides, verbal discussion, and written materials to support subject matter. The instructor gives patients tips, tricks, and techniques to help them not only survive but enjoy the holidays  despite the onslaught of food that accompanies the holidays.   Knowledge Questionnaire Score:  Knowledge Questionnaire Score - 07/20/21 0937       Knowledge Questionnaire Score   Pre Score 20/24             Core Components/Risk Factors/Patient Goals at Admission:  Personal Goals and Risk Factors at Admission - 07/20/21 0938       Core Components/Risk Factors/Patient Goals on Admission    Weight Management Yes    Intervention Weight Management: Develop a combined nutrition and exercise program designed to reach desired caloric intake, while maintaining appropriate intake of nutrient and fiber, sodium and fats, and appropriate energy expenditure required for the weight goal.;Weight Management: Provide education and appropriate resources to help participant work on and attain dietary goals.;Weight Management/Obesity: Establish reasonable short term and long term weight goals.;Obesity: Provide education and appropriate resources to help participant work on and attain dietary goals.    Admit Weight 229 lb (103.9 kg)    Goal Weight: Long Term 190 lb (86.2 kg)    Expected Outcomes Short Term: Continue to assess and modify interventions until short term weight is achieved;Long Term: Adherence to nutrition and physical activity/exercise program aimed toward attainment of established weight goal;Weight Loss: Understanding of general recommendations for a balanced deficit meal plan, which promotes 1-2 lb weight loss per week and includes a negative energy balance of [phone] kcal/d;Understanding recommendations for meals to include 15-35% energy as protein, 25-35% energy from fat, 35-60% energy from carbohydrates, less than $RemoveB'200mg'ERcPaOId$  of dietary cholesterol, 20-35 gm of total fiber daily;Understanding of distribution of calorie intake throughout the day with the consumption of 4-5 meals/snacks    Personal Goal Other Yes    Personal Goal Short term: ADL's easier, hills are hard, know limits Long: Wt loss,  goal wt 190lbs, strength, endurance, golf    Intervention Will continue to monitor pt and progress workloads as tolerated without sign or symptom    Expected Outcomes Pt will achieve his goals and gain cardiorespiratory strength             Core Components/Risk Factors/Patient Goals Review:   Goals and Risk Factor Review     Row Name 08/14/21 1417 09/12/21 1005           Core Components/Risk Factors/Patient Goals Review   Personal Goals Review Weight Management/Obesity;Other Weight Management/Obesity;Other      Review Braxten is off to a great start for Cardiac rehab and is making progress toward program and personal goals. Finian has excellent attendance and engages in consistent home exercise. Bertran has lost 1.6 kg since beginning cardiac rehab. Yogesh has completed 21 exercise sessions and plans to graduate later this week.  Senai continues to make progress and has added high intesity training during the stationary bike station this has contributed to decrease weight of 2.1kg since beginning cardiac rehab.      Expected Outcomes Milferd will continue to engage in cardiac rehab for exercise, nutrition and adaptation of heart healthy lifestyle modifications. Tukker will continue to engage in cardiac rehab for exercise, nutrition and adaptation of heart healthy lifestyle modifications.               Core Components/Risk Factors/Patient Goals at Discharge (Final Review):   Goals and  Risk Factor Review - 09/12/21 1005       Core Components/Risk Factors/Patient Goals Review   Personal Goals Review Weight Management/Obesity;Other    Review Bradshaw has completed 21 exercise sessions and plans to graduate later this week.  Saqib continues to make progress and has added high intesity training during the stationary bike station this has contributed to decrease weight of 2.1kg since beginning cardiac rehab.    Expected Outcomes Ignacio will continue to engage in cardiac rehab for exercise, nutrition and  adaptation of heart healthy lifestyle modifications.             ITP Comments:  ITP Comments     Row Name 07/20/21 0820 08/15/21 0942 09/12/21 1001       ITP Comments Dr Fransico Him MD, Medical Director 30 day ITP review 30 day ITP review. Jayde is looking forward to graduation on 09/15/21              Comments: Princeton is making expected progress toward personal goals after completing 21 sessions. Recommend continued exercise and life style modification education including  stress management and relaxation techniques to decrease cardiac risk profile. [name], BSN Cardiac and Training and development officer

## 2021-09-13 ENCOUNTER — Encounter (HOSPITAL_COMMUNITY)
Admission: RE | Admit: 2021-09-13 | Discharge: 2021-09-13 | Disposition: A | Discharge status: Home / Self Care | Payer: Federal, State, Local not specified - PPO | Source: Ambulatory Visit | Attending: Cardiology | Admitting: Cardiology

## 2021-09-13 VITALS — Ht 69.5 in | Wt 221.6 lb

## 2021-09-13 DIAGNOSIS — D151 Benign neoplasm of heart: Secondary | ICD-10-CM

## 2021-09-15 ENCOUNTER — Encounter (HOSPITAL_COMMUNITY)
Admission: RE | Admit: 2021-09-15 | Discharge: 2021-09-15 | Disposition: A | Discharge status: Home / Self Care | Payer: Federal, State, Local not specified - PPO | Source: Ambulatory Visit | Attending: Cardiology | Admitting: Cardiology

## 2021-09-15 DIAGNOSIS — D151 Benign neoplasm of heart: Secondary | ICD-10-CM

## 2021-09-15 NOTE — Telephone Encounter (Signed)
PA approval received from [facility] for sleep study. Auth # [id number]. Valid dates 09/18/21 to 03/16/22.

## 2021-09-15 NOTE — Progress Notes (Signed)
Discharge Progress Report  Patient Details  Name: Shawn Suarez MRN: [id number] Date of Birth: [birth year] Referring Provider:   Flowsheet Row CARDIAC REHAB PHASE II ORIENTATION from 07/20/2021 in [place]  Referring Provider Dr. [name]        Number of Visits: 23  Reason for Discharge:  Patient reached a stable level of exercise. Patient independent in their exercise. Patient has met program and personal goals.  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 [name]    Expected Discharge Date 09/15/21      Treadmill   MPH 2.7    Grade 1    Minutes 15    METs 3.44      Bike   Level 1.2    Minutes 15    METs 3.18      Prescription Details   Frequency (times per week) 3    Duration Progress to 30 minutes of continuous aerobic without signs/symptoms of physical distress      Intensity   THRR 40-80% of Max Heartrate 65-130    Ratings of Perceived Exertion 11-13    Perceived Dyspnea 0-4      Progression   Progression Continue progressive overload as per policy without signs/symptoms or physical distress.      Resistance Training   Training Prescription Yes    Weight 3    Reps 10-15             Discharge Exercise Prescription (Final Exercise Prescription Changes):  Exercise Prescription Changes - 09/15/21 0830       Response to Exercise   Blood Pressure (Admit) 100/60    Blood Pressure (Exercise) 160/80    Blood Pressure (Exit) 108/58    Heart Rate (Admit) 64 bpm    Heart Rate (Exercise) 124 bpm    Heart Rate (Exit) 74 bpm    Rating of Perceived  Exertion (Exercise) 13    Perceived Dyspnea (Exercise) 0    Symptoms 0    Comments Pt graduated the CRP2 program    Duration Progress to 30 minutes of  aerobic without signs/symptoms of physical distress    Intensity THRR unchanged      Progression   Progression Continue to progress workloads to maintain intensity without signs/symptoms of physical distress.    Average METs 5.51      Resistance Training   Training Prescription Yes    Weight 6    Reps 10-15    Time 10 Minutes      Interval Training   Interval Training Yes    Equipment Bike    Comments 2/1 interval      Treadmill   MPH 3.8    Grade 2    Minutes 15    METs 4.96      Bike   Level 2.9   INT training 2.9 level for 6 mins, 2.2 level (5.13 MET)  9 mins   Minutes 15    METs 6.44      Home Exercise Plan   Plans to continue exercise at [employer] (comment)  Frequency Add 3 additional days to program exercise sessions.    Initial Home Exercises Provided 08/07/21             Functional Capacity:  6 Minute Walk     Row Name 07/20/21 0911 09/13/21 0830       6 Minute Walk   Phase Initial Discharge    Distance 1745 feet 2160 feet    Distance % Change -- 23.78 %    Distance Feet Change -- 415 ft    Walk Time 6 minutes 6 minutes    # of Rest Breaks 0 0    MPH 3.3 4.09    METS 3.68 4.78    RPE 10 12    Perceived Dyspnea  0 0    VO2 Peak 12.9 16.74    Symptoms Yes (comment) No    Comments calf tighness. Relieved with rest. --    Resting HR 69 bpm 66 bpm    Resting BP 104/70 108/60    Resting Oxygen Saturation  97 % 97 %    Exercise Oxygen Saturation  during 6 min walk 98 % 97 %    Max Ex. HR 78 bpm 446 bpm    Max Ex. BP 112/60 142/66    2 Minute Post BP 102/62 --             Psychological, QOL, Others - Outcomes: PHQ 2/9:    09/15/2021    2:13 PM 07/20/2021    8:35 AM  Depression screen PHQ 2/9  Decreased Interest 0 0  Down, Depressed, Hopeless 0 0  PHQ - 2 Score 0 0  Altered  sleeping 0   Tired, decreased energy 0   Change in appetite 0   Feeling bad or failure about yourself  0   Trouble concentrating 0   Moving slowly or fidgety/restless 0   Suicidal thoughts 0   PHQ-9 Score 0   Difficult doing work/chores Not difficult at all     Quality of Life:  Quality of Life - 09/13/21 1158       Quality of Life   Select Quality of Life      Quality of Life Scores   Health/Function Post 29.03 %    Socioeconomic Post 28.57 %    Psych/Spiritual Post 29.14 %    Family Post 30 %    GLOBAL Post 29.1 %             Personal Goals: Goals established at orientation with interventions provided to work toward goal.  Personal Goals and Risk Factors at Admission - 07/20/21 0938       Core Components/Risk Factors/Patient Goals on Admission    Weight Management Yes    Intervention Weight Management: Develop a combined nutrition and exercise program designed to reach desired caloric intake, while maintaining appropriate intake of nutrient and fiber, sodium and fats, and appropriate energy expenditure required for the weight goal.;Weight Management: Provide education and appropriate resources to help participant work on and attain dietary goals.;Weight Management/Obesity: Establish reasonable short term and long term weight goals.;Obesity: Provide education and appropriate resources to help participant work on and attain dietary goals.    Admit Weight 229 lb (103.9 kg)    Goal Weight: Long Term 190 lb (86.2 kg)    Expected Outcomes Short Term: Continue to assess and modify interventions until short term weight is achieved;Long Term: Adherence to nutrition and physical activity/exercise program aimed toward attainment of established weight goal;Weight Loss: Understanding of general recommendations  for a balanced deficit meal plan, which promotes 1-2 lb weight loss per week and includes a negative energy balance of [phone] kcal/d;Understanding recommendations for meals to  include 15-35% energy as protein, 25-35% energy from fat, 35-60% energy from carbohydrates, less than 288m of dietary cholesterol, 20-35 gm of total fiber daily;Understanding of distribution of calorie intake throughout the day with the consumption of 4-5 meals/snacks    Personal Goal Other Yes    Personal Goal Short term: ADL's easier, hills are hard, know limits Long: Wt loss, goal wt 190lbs, strength, endurance, golf    Intervention Will continue to monitor pt and progress workloads as tolerated without sign or symptom    Expected Outcomes Pt will achieve his goals and gain cardiorespiratory strength              Personal Goals Discharge:  Goals and Risk Factor Review     Row Name 08/14/21 1417 09/12/21 1005 09/15/21 1407         Core Components/Risk Factors/Patient Goals Review   Personal Goals Review Weight Management/Obesity;Other Weight Management/Obesity;Other Weight Management/Obesity;Other     Review AKelijahis off to a great start for Cardiac rehab and is making progress toward program and personal goals. ARanellhas excellent attendance and engages in consistent home exercise. ALavarrhas lost 1.6 kg since beginning cardiac rehab. AJantzenhas completed 21 exercise sessions and plans to graduate later this week.  ARayleecontinues to make progress and has added high intesity training during the stationary bike station this has contributed to decrease weight of 2.1kg since beginning cardiac rehab. AAntony Hastegraduates with the completion of 23/24 exercsie sessions.  AKenrickhas made wonderful progress and able to add in high intensity training with decrease weight 3.1 kg.  Based upon his committment to exercise and attendance, I anticipate ATrevyonwill continue to do well with home exercise.     Expected Outcomes ADekkerwill continue to engage in cardiac rehab for exercise, nutrition and adaptation of heart healthy lifestyle modifications. ALavallewill continue to engage in cardiac rehab for exercise, nutrition and  adaptation of heart healthy lifestyle modifications. APlacidoplans to continue exercise 6 days a week with [employer] 3 x week and walking and bands 3 x week.              Exercise Goals and Review:  Exercise Goals     Row Name 07/20/21 0919             Exercise Goals   Increase Physical Activity Yes       Intervention Provide advice, education, support and counseling about physical activity/exercise needs.;Develop an individualized exercise prescription for aerobic and resistive training based on initial evaluation findings, risk stratification, comorbidities and participant's personal goals.       Expected Outcomes Short Term: Attend rehab on a regular basis to increase amount of physical activity.;Long Term: Exercising regularly at least 3-5 days a week.;Long Term: Add in home exercise to make exercise part of routine and to increase amount of physical activity.       Increase Strength and Stamina Yes       Intervention Provide advice, education, support and counseling about physical activity/exercise needs.;Develop an individualized exercise prescription for aerobic and resistive training based on initial evaluation findings, risk stratification, comorbidities and participant's personal goals.       Expected Outcomes Short Term: Increase workloads from initial exercise prescription for resistance, speed, and METs.;Short Term: Perform resistance training exercises routinely during rehab and add  in resistance training at home;Long Term: Improve cardiorespiratory fitness, muscular endurance and strength as measured by increased METs and functional capacity (6MWT)       Able to understand and use rate of perceived exertion (RPE) scale Yes       Intervention Provide education and explanation on how to use RPE scale       Expected Outcomes Short Term: Able to use RPE daily in rehab to express subjective intensity level;Long Term:  Able to use RPE to guide intensity level when exercising  independently       Knowledge and understanding of Target Heart Rate Range (THRR) Yes       Intervention Provide education and explanation of THRR including how the numbers were predicted and where they are located for reference       Expected Outcomes Short Term: Able to state/look up THRR;Long Term: Able to use THRR to govern intensity when exercising independently;Short Term: Able to use daily as guideline for intensity in rehab       Understanding of Exercise Prescription Yes       Intervention Provide education, explanation, and written materials on patient's individual exercise prescription       Expected Outcomes Short Term: Able to explain program exercise prescription;Long Term: Able to explain home exercise prescription to exercise independently                Exercise Goals Re-Evaluation:  Exercise Goals Re-Evaluation     Row Name 07/24/21 0830 08/07/21 0830 09/08/21 0830 09/15/21 0830       Exercise Goal Re-Evaluation   Exercise Goals Review Increase Physical Activity;Increase Strength and Stamina;Able to understand and use rate of perceived exertion (RPE) scale;Knowledge and understanding of Target Heart Rate Range (THRR);Understanding of Exercise Prescription Increase Physical Activity;Increase Strength and Stamina;Able to understand and use rate of perceived exertion (RPE) scale;Knowledge and understanding of Target Heart Rate Range (THRR);Understanding of Exercise Prescription Increase Physical Activity;Increase Strength and Stamina;Able to understand and use rate of perceived exertion (RPE) scale;Knowledge and understanding of Target Heart Rate Range (THRR);Understanding of Exercise Prescription Increase Physical Activity;Increase Strength and Stamina;Able to understand and use rate of perceived exertion (RPE) scale;Knowledge and understanding of Target Heart Rate Range (THRR);Understanding of Exercise Prescription    Comments Pt first day in the CRP2 program. Pt tolerated  exercise well with an average MET level of 3.45. Pt is learning his THRR, RPE and ExRx Reviewed MET's, goals and home ExRx. Pt tolerated exercise well with an average MET level of 4.29. Pt will exercise on his own by walking, using bands and [employer] (reviewed sternal precautions for types of exercises at PF) for 3 days a week for 30-45 mins per session. Reviewed MET's and goals. Pt tolerated exercise well with an average MET level of 5.42. Pt feels great about his goals. He is able to accomplish all of his goals. Strength, easy of ADL's and golf are all going very well for him and he is able to enjoy life more. Pt feels like he has gained strength and endurance as well and enjoys being able to interval train. Overall pt is doing very well and is feeling much better Pt graduated the [place] program with an average MET level of 5.51 with Bike interval training. Pt feels great about his progress in the CRP2 program and will continue to exercise on his own using bands, walking and [employer] 5 days for 30-45 mins per session    Expected Outcomes Will continue to  monitor pt and progress workloads as tolerated without sign or symptom Pt will continue to exercise on his own. Will continue to monitor pt and progress workloads as tolerated without sign or symptom. Will continue to monitor pt and progress workloads as tolerated without sign or symptom. Pt will continue to exercise on his own and gain strength.             Nutrition & Weight - Outcomes:  Pre Biometrics - 07/20/21 0923       Pre Biometrics   Height 5' 9.5" (1.765 m)    Weight 229 lb 0.9 oz (103.9 kg)    Waist Circumference 44.5 inches    Hip Circumference 42.5 inches    Waist to Hip Ratio 1.05 %    BMI (Calculated) 33.35    Triceps Skinfold 25 mm    % Body Fat 33.4 %    Grip Strength 44 kg    Flexibility 10 in    Single Leg Stand 30 seconds             Post Biometrics - 09/13/21 0830        Post  Biometrics   Height 5'  9.5" (1.765 m)    Weight 221 lb 9 oz (100.5 kg)    Waist Circumference 44 inches    Hip Circumference 42 inches    Waist to Hip Ratio 1.05 %    BMI (Calculated) 32.26    Triceps Skinfold 20 mm    % Body Fat 31.8 %    Grip Strength 48 kg    Flexibility 11.75 in    Single Leg Stand 30 seconds             Nutrition:  Nutrition Therapy & Goals - 09/15/21 0806       Nutrition Therapy   Diet Heart healthy diet      Personal Nutrition Goals   Nutrition Goal Patitent to identify food quantities and strategies necessary to achieve weight loss of 0.5-2.0# per week.   in progress   Personal Goal #2 Patient to describe the benefit of including fruits/vegetables, whole grains, low fat dairy, lean protein/plant protein as part of a heart healthy meal plan.   in progress   Personal Goal #3 Patient to limit food sources of saturated fats, trans fats, refined carbohydrates, and sodium.   in progress   Comments Patient graduates today. He continues to work toward weight loss goals using strategies such as high protein/high fiber intake, tracking, protein supplements, the plate method for meal planning, etc.      Intervention Plan   Intervention Nutrition handout(s) given to patient.    Expected Outcomes Long Term Goal: Adherence to prescribed nutrition plan.;Short Term Goal: A plan has been developed with personal nutrition goals set during dietitian appointment.             Nutrition Discharge:  Nutrition Assessments - 09/13/21 1153       Rate Your Plate Scores   Post Score 81             Education Questionnaire Score:  Knowledge Questionnaire Score - 09/13/21 0830       Knowledge Questionnaire Score   Post Score 24/24             Goals reviewed with patient. Pt graduated from cardiac rehab program today with completion of 23 exercise sessions in Phase II. Pt maintained good attendance and progressed nicely during his participation in rehab as evidenced by increased  MET  level.   Medication list reconciled. Repeat  PHQ score-0  .  Pt has made significant lifestyle changes and should be commended for his success. Pt feels he has achieved his goals during cardiac rehab.   Pt plans to continue exercise at [employer] and walking. [name], BSN Cardiac and Training and development officer

## 2021-10-05 ENCOUNTER — Other Ambulatory Visit: Payer: Self-pay

## 2021-10-05 ENCOUNTER — Ambulatory Visit (HOSPITAL_BASED_OUTPATIENT_CLINIC_OR_DEPARTMENT_OTHER): Payer: Federal, State, Local not specified - PPO | Attending: Cardiology | Admitting: Cardiology

## 2021-10-05 DIAGNOSIS — Z6832 Body mass index (BMI) 32.0-32.9, adult: Secondary | ICD-10-CM | POA: Diagnosis not present

## 2021-10-05 DIAGNOSIS — E669 Obesity, unspecified: Secondary | ICD-10-CM | POA: Insufficient documentation

## 2021-10-05 DIAGNOSIS — R0683 Snoring: Secondary | ICD-10-CM

## 2021-10-05 DIAGNOSIS — R400 Somnolence: Secondary | ICD-10-CM | POA: Diagnosis not present

## 2021-10-06 NOTE — Procedures (Signed)
   Patient Name: Shawn Suarez, Shawn Suarez Date:10/05/2021 Gender: Male D.O.B: [birth year] Age (years): 23 Referring Provider: Jerline Pain Height (inches): 69 Interpreting Physician: Fransico Him MD, ABSM Weight (lbs): 219 RPSGT: Jorge Ny BMI: 32 MRN: [id number] Neck Size: 17.50  CLINICAL INFORMATION Sleep Study Type: NPSG  Indication for sleep study: Excessive Daytime Sleepiness, Fatigue, Obesity, Snoring  SLEEP STUDY TECHNIQUE As per the AASM Manual for the Scoring of Sleep and Associated Events v2.3 (April 2016) with a hypopnea requiring 4% desaturations.  The channels recorded and monitored were frontal, central and occipital EEG, electrooculogram (EOG), submentalis EMG (chin), nasal and oral airflow, thoracic and abdominal wall motion, anterior tibialis EMG, snore microphone, electrocardiogram, and pulse oximetry.  MEDICATIONS Medications self-administered by patient taken the night of the study : N/A  SLEEP ARCHITECTURE The study was initiated at 10:23:57 PM and ended at 4:50:49 AM.  Sleep onset time was 14.7 minutes and the sleep efficiency was 40.7%. The total sleep time was 157.5 minutes.  Stage REM latency was N/A minutes.  The patient spent 10.8% of the night in stage N1 sleep, 68.3% in stage N2 sleep, 21.0% in stage N3 and 0% in REM.  Alpha intrusion was absent.  Supine sleep was 6.98%.  RESPIRATORY PARAMETERS The overall apnea/hypopnea index (AHI) was 3.4 per hour. There were 1 total apneas, including 1 obstructive, 0 central and 0 mixed apneas. There were 8 hypopneas and 10 RERAs.  The AHI during Stage REM sleep was N/A per hour.  AHI while supine was 43.6 per hour.  The mean oxygen saturation was 92.4%. The minimum SpO2 during sleep was 90.0%.  snoring was noted during this study.  CARDIAC DATA The 2 lead EKG demonstrated sinus rhythm. The mean heart rate was 60.0 beats per minute. Other EKG findings include: None.  LEG MOVEMENT DATA The total  PLMS were 0 with a resulting PLMS index of 0.0. Associated arousal with leg movement index was 1.1 .  IMPRESSIONS - No significant obstructive sleep apnea occurred during this study (AHI = 3.4/h) but nondiagnostic due to indsufficient sleep time and no REM sleep. - The patient had minimal or no oxygen desaturation during the study (Min O2 = 90.0%) - No snoring was audible during this study. - No cardiac abnormalities were noted during this study. - Clinically significant periodic limb movements did not occur during sleep. No significant associated arousals.  DIAGNOSIS - Nondiagnostic Study  RECOMMENDATIONS - Repeat PSG with sleep aide. - Positional therapy avoiding supine position during sleep. - Avoid alcohol, sedatives and other CNS depressants that may worsen sleep apnea and disrupt normal sleep architecture. - Sleep hygiene should be reviewed to assess factors that may improve sleep quality. - Weight management and regular exercise should be initiated or continued if appropriate.  [Electronically signed] 10/06/2021 08:20 AM  Fransico Him MD, ABSM Diplomate, American Board of Sleep Medicine

## 2021-10-13 ENCOUNTER — Other Ambulatory Visit: Payer: Self-pay | Admitting: Cardiology

## 2021-10-17 ENCOUNTER — Telehealth: Payer: Self-pay | Admitting: *Deleted

## 2021-10-17 DIAGNOSIS — I2510 Atherosclerotic heart disease of native coronary artery without angina pectoris: Secondary | ICD-10-CM

## 2021-10-17 DIAGNOSIS — R0683 Snoring: Secondary | ICD-10-CM

## 2021-10-17 DIAGNOSIS — R400 Somnolence: Secondary | ICD-10-CM

## 2021-10-17 MED ORDER — ESZOPICLONE 3 MG PO TABS: 3.0000 mg | ORAL_TABLET | Freq: Every day | ORAL | 0 refills | Status: DC

## 2021-10-17 NOTE — Telephone Encounter (Signed)
The patient has been notified of the result and verbalized understanding.  All questions (if any) were answered. Marolyn Hammock, [place] 10/17/2021 34:06 PM    Precert NPSG

## 2021-10-17 NOTE — Telephone Encounter (Signed)
-----   Message from Lauralee Evener, [place] sent at 10/06/2021  8:35 AM EDT -----  ----- Message ----- From: Sueanne Margarita, MD Sent: 10/06/2021   8:22 AM EDT To: [facility] Div Sleep Studies  Nondiagnostic study due to lack of adequate sleep time - repeat PSG with sleep aide (lunesta '3mg'$  dispense #1 tablet with 0 refills to take at sleep lab)

## 2021-11-14 ENCOUNTER — Ambulatory Visit: Payer: Federal, State, Local not specified - PPO | Admitting: Thoracic Surgery (Cardiothoracic Vascular Surgery)

## 2021-11-14 ENCOUNTER — Encounter: Payer: Self-pay | Admitting: Thoracic Surgery (Cardiothoracic Vascular Surgery)

## 2021-11-14 VITALS — BP 140/73 | HR 63 | Resp 20 | Ht 69.0 in | Wt 220.0 lb

## 2021-11-14 DIAGNOSIS — Z09 Encounter for follow-up examination after completed treatment for conditions other than malignant neoplasm: Secondary | ICD-10-CM

## 2021-11-14 DIAGNOSIS — D151 Benign neoplasm of heart: Secondary | ICD-10-CM

## 2021-11-14 NOTE — Progress Notes (Signed)
[address]       [place] [address]             [phone]    HPI: Shawn Suarez returns for a 65-monthfollow-up visit after having a left atrial myxoma resected  ATakashi Suarez a 56year old man who had resection of a left atrial myxoma and pulmonary vein isolation on 05/10/2021.  Initially thoracotomy approach but had to convert to sternotomy due to complications with cannulating the femoral artery.  He did have atrial fibrillation postoperatively.  I last saw him on 08/15/2021.  He was doing well at that time but was having some numbness in his right foot in a sitting position.  In the interim since that visit he has been doing well.  He is no longer having any incisional discomfort.  Numbness in the right foot is resolved.  He does have 1 area of numbness on the inner aspect of the right thigh.  He is back at work.  He had a sleep study but did not have sufficient REM sleep, so it was inconclusive.  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 60 tablet 5   aspirin EC 81 MG tablet Take 1 tablet (81 mg total) by mouth daily. Swallow whole. 90 tablet 3   metoprolol tartrate (LOPRESSOR) 25 MG tablet Take 0.5 tablets (12.5 mg total) by mouth 2 (two) times daily. 90  tablet 3   mometasone-formoterol (DULERA) 100-5 MCG/ACT AERO Inhale 2 puffs into the lungs 2 (two) times daily as needed (Asthma like symptoms).     pantoprazole (PROTONIX) 20 MG tablet Take 20 mg by mouth See admin instructions. Take 20 mg daily, may take a second 20 mg dose as needed for heartburn     Eszopiclone 3 MG TABS Take 1 tablet (3 mg total) by mouth at bedtime. Take immediately before bedtime (Patient not taking: Reported on 11/14/2021) 1 tablet 0   No current facility-administered medications for this visit.    Physical Exam BP (!) 140/73   Pulse 63   Resp 20   Ht '5\' 9"'$  (1.753 m)   Wt 220 lb (99.8 kg)   SpO2 96% Comment: RA  BMI 32.46kg/m  56year old man in no acute distress Alert and oriented x 3 with no focal deficits Lungs clear bilaterally Cardiac regular rate and rhythm Sternotomy well-healed No peripheral edema   Impression: ACordelro Gautreauis a 56year old man who is now about 6 months out from resection of a left atrial myxoma and pulmonary vein isolation.  He is doing extremely well at this time.  Amiodarone was stopped when he saw Dr. SMarlou Porchin early June.  Still on apixaban.  He is going to do a Zio patch prior to a follow-up appointment in December to see  if he can stop that medication.  Plan: Follow-up as scheduled with Dr. Marlou Porch I will be happy to see Shawn Suarez back anytime in the future if I can be of any assistance with his care  Melrose Nakayama, MD [facility] [phone]

## 2021-11-16 DIAGNOSIS — M722 Plantar fascial fibromatosis: Secondary | ICD-10-CM | POA: Diagnosis not present

## 2021-11-16 DIAGNOSIS — C61 Malignant neoplasm of prostate: Secondary | ICD-10-CM | POA: Diagnosis not present

## 2021-11-16 DIAGNOSIS — M25572 Pain in left ankle and joints of left foot: Secondary | ICD-10-CM | POA: Diagnosis not present

## 2022-01-21 ENCOUNTER — Other Ambulatory Visit: Payer: Self-pay | Admitting: Cardiology

## 2022-01-22 NOTE — Telephone Encounter (Signed)
Eliquis '5mg'$  refill request received. Patient is 56 years old, weight-99.8kg, Crea-1.13 on 06/15/2021, Diagnosis-Afib, and last seen by Dr. Marlou Porch on 09/08/2021. Dose is appropriate based on dosing criteria. Will send in refill to requested pharmacy.

## 2022-03-09 ENCOUNTER — Telehealth: Payer: Self-pay | Admitting: *Deleted

## 2022-03-09 ENCOUNTER — Ambulatory Visit: Payer: Federal, State, Local not specified - PPO | Attending: Cardiology | Admitting: Cardiology

## 2022-03-09 ENCOUNTER — Encounter: Payer: Self-pay | Admitting: Cardiology

## 2022-03-09 ENCOUNTER — Ambulatory Visit (INDEPENDENT_AMBULATORY_CARE_PROVIDER_SITE_OTHER): Payer: Federal, State, Local not specified - PPO

## 2022-03-09 VITALS — BP 90/60 | HR 69 | Ht 69.0 in | Wt 234.0 lb

## 2022-03-09 DIAGNOSIS — R400 Somnolence: Secondary | ICD-10-CM | POA: Diagnosis not present

## 2022-03-09 DIAGNOSIS — R0683 Snoring: Secondary | ICD-10-CM

## 2022-03-09 DIAGNOSIS — I480 Paroxysmal atrial fibrillation: Secondary | ICD-10-CM

## 2022-03-09 NOTE — Progress Notes (Unsigned)
Enrolled patient for a 14 day Zio XT  monitor to be mailed to patients home  °

## 2022-03-09 NOTE — Telephone Encounter (Signed)
Pt was seen in the office today by [facility] who ordered an itamar study. Pt aware to not open the box until he has been called with a PIN#. Pt agreeable to signed waiver.

## 2022-03-09 NOTE — Patient Instructions (Signed)
Medication Instructions:  The current medical regimen is effective;  continue present plan and medications.  *If you need a refill on your cardiac medications before your next appointment, please call your pharmacy*  Testing/Procedures: [place] Monitor Instructions  Your physician has requested you wear a ZIO patch monitor for 14 days.  This is a single patch monitor. Ir[employer] supplies one patch monitor per enrollment. Additional stickers are not available. Please do not apply patch if you will be having a Nuclear Stress Test,  Echocardiogram, Cardiac CT, MRI, or Chest Xray during the period you would be wearing the  monitor. The patch cannot be worn during these tests. You cannot remove and re-apply the  ZIO XT patch monitor.  Your ZIO patch monitor will be mailed 3 day USPS to your address on file. It may take 3-5 days  to receive your monitor after you have been enrolled.  Once you have received your monitor, please review the enclosed instructions. Your monitor  has already been registered assigning a specific monitor serial # to you.  Billing and Patient Assistance Program Information  We have supplied [employer] with any of your insurance information on file for billing purposes. [employer] offers a sliding scale Patient Assistance Program for patients that do not have  insurance, or whose insurance does not completely cover the cost of the ZIO monitor.  You must apply for the Patient Assistance Program to qualify for this discounted rate.  To apply, please call [employer] at [phone], select option 4, select option 2, ask to apply for  Patient Assistance Program. Theodore Demark will ask your household income, and how many people  are in your household. They will quote your out-of-pocket cost based on that information.  [employer] will also be able to set up a 24-month interest-free payment plan if needed.  Applying the monitor   Shave hair from upper left chest.  Hold abrader disc  by orange tab. Rub abrader in 40 strokes over the upper left chest as  indicated in your monitor instructions.  Clean area with 4 enclosed alcohol pads. Let dry.  Apply patch as indicated in monitor instructions. Patch will be placed under collarbone on left  side of chest with arrow pointing upward.  Rub patch adhesive wings for 2 minutes. Remove white label marked "1". Remove the white  label marked "2". Rub patch adhesive wings for 2 additional minutes.  While looking in a mirror, press and release button in center of patch. A small green light will  flash 3-4 times. This will be your only indicator that the monitor has been turned on.  Do not shower for the first 24 hours. You may shower after the first 24 hours.  Press the button if you feel a symptom. You will hear a small click. Record Date, Time and  Symptom in the Patient Logbook.  When you are ready to remove the patch, follow instructions on the last 2 pages of Patient  Logbook. Stick patch monitor onto the last page of Patient Logbook.  Place Patient Logbook in the blue and white box. Use locking tab on box and tape box closed  securely. The blue and white box has prepaid postage on it. Please place it in the mailbox as  soon as possible. Your physician should have your test results approximately 7 days after the  monitor has been mailed back to [facility]  Call [facility] [phone] you have questions regarding  your ZIO XT patch  monitor. Call them immediately if you see an orange light blinking on your  monitor.  If your monitor falls off in less than 4 days, contact our Monitor department at [phone].  If your monitor becomes loose or falls off after 4 days call [employer] at [phone] for  suggestions on securing your monitor  Your physician has recommended that you have a sleep study. This test records several body functions during sleep, including: brain activity, eye movement, oxygen  and carbon dioxide blood levels, heart rate and rhythm, breathing rate and rhythm, the flow of air through your mouth and nose, snoring, body muscle movements, and chest and belly movement.  Follow-Up: At [facility], you and your health needs are our priority.  As part of our continuing mission to provide you with exceptional heart care, we have created designated Provider Care Teams.  These Care Teams include your primary Cardiologist (physician) and Advanced Practice Providers (APPs -  Physician Assistants and Nurse Practitioners) who all work together to provide you with the care you need, when you need it.  We recommend signing up for the patient portal called "MyChart".  Sign up information is provided on this After Visit Summary.  MyChart is used to connect with patients for Virtual Visits (Telemedicine).  Patients are able to view lab/test results, encounter notes, upcoming appointments, etc.  Non-urgent messages can be sent to your provider as well.   To learn more about what you can do with MyChart, go to [email or web address].    Your next appointment:   6 month(s)  The format for your next appointment:   In Person  Provider:   [name], MD      Important Information About Sugar

## 2022-03-09 NOTE — Progress Notes (Signed)
Cardiology Office Note:    Date:  03/09/2022   ID:  Shawn Suarez, DOB 05-Mar-1966, MRN [id number]  PCP:  Antony Contras, MD   [facility] Providers Cardiologist:  [name], MD     Referring MD: Antony Contras, MD    History of Present Illness:    Shawn Suarez is a 56 y.o. 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  Prior visit on 02/21/21 for the evaluation of rapid heart rate/palpitations, continued heart rate elevation following exercise, shortness of breath.  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  His daughter, Judson Roch rides at [employer], now at [employer].  Previously in the [employer]  Was a Runner, broadcasting/film/video.  His father had a "widow maker "MI at age 58 with stent placement.  His mother has had atrial fibrillation since her 35s.  Has had mild intermittent asthma occasionally utilizing Dulera.   Today: Skips  The patient has successfully lost 40 pounds since the last appointment, and plans to lose 30 more. He is now able to exercise and enjoys playing golf.        Sometimes he struggles to retain focus on tasks, although it is "85% better" than last visit. He has restarted working on his doctorate.  Sternotomy scar is looking well. He gets full sensation in the scar, and it is more sensitive than usual. The feeling of his shirt on the scar "drives him crazy." Unfortunately, he cannot sleep on his side as he feels his chest muscles "pulling inward," which he says is more painful than the surgery.     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   ZIO monitor 2022-symptomatic   GERD (gastroesophageal reflux disease)    Paroxysmal atrial fibrillation ([place]) 04/19/2021   ZIO monitor-2022- approximately 10 minutes atrial fibrillation rapid ventricular response    Past Surgical History:  Procedure Laterality Date   CHOLECYSTECTOMY     MEDIASTERNOTOMY N/A 05/10/2021   Procedure: MEDIAN STERNOTOMY;  Surgeon: Melrose Nakayama, MD;  Location: [place];  Service: Open Heart Surgery;  Laterality: N/A;   MINIMALLY INVASIVE EXCISION OF ATRIAL MYXOMA Right 05/10/2021   Procedure: MINIMALLY INVASIVE EXCISION OF ATRIAL MYXOMA;  Surgeon: Melrose Nakayama, MD;  Location: [place];  Service: Open Heart Surgery;  Laterality: Right;   MINIMALLY INVASIVE MAZE PROCEDURE N/A 05/10/2021   Procedure: PARTIAL CRYOMAZE PROCEDURE;  Surgeon: Melrose Nakayama, MD;  Location: [place];  Service: Open Heart Surgery;  Laterality: N/A;   TEE WITHOUT CARDIOVERSION  05/10/2021   Procedure: TRANSESOPHAGEAL ECHOCARDIOGRAM (TEE);  Surgeon: Melrose Nakayama, MD;  Location: [facility];  Service: Open Heart Surgery;;    Current Medications: Current Meds  Medication Sig   aspirin EC 81 MG tablet Take 1 tablet (81 mg total) by mouth daily. Swallow whole.   ELIQUIS 5 MG TABS tablet TAKE 1 TABLET BY MOUTH TWICE A DAY   metoprolol tartrate (LOPRESSOR) 25 MG tablet Take 0.5 tablets (12.5 mg total) by mouth 2 (two) times daily.   mometasone-formoterol (DULERA)  100-5 MCG/ACT AERO Inhale 2 puffs into the lungs 2 (two) times daily as needed (Asthma like symptoms).   pantoprazole (PROTONIX) 20 MG tablet Take 20 mg by mouth See admin instructions. Take 20 mg daily, may take a second 20 mg dose as needed for heartburn     Allergies:   Patient has no known allergies.   Social History   Socioeconomic History   Marital status: Married    Spouse name: Not on file   Number of children: Not on  file   Years of education: Not on file   Highest education level: Professional school degree (e.g., MD, DDS, DVM, JD)  Occupational History   Not on file  Tobacco Use   Smoking status: Never   Smokeless tobacco: Never   Tobacco comments:    1 can per 1.5 weeks  Vaping Use   Vaping Use: Never used  Substance and Sexual Activity   Alcohol use: Not Currently    Comment: rare   Drug use: Never   Sexual activity: Not on file  Other Topics Concern   Not on file  Social History Narrative   Not on file   Social Determinants of Health   Financial Resource Strain: Not on file  Food Insecurity: Not on file  Transportation Needs: Not on file  Physical Activity: Not on file  Stress: Not on file  Social Connections: Not on file     Family History: The patient's family history includes Diabetes in his father; Hypertension in his mother. There is no history of Heart attack, Hyperlipidemia, or Sudden death.  ROS:   Please see the history of present illness.     (+) Palpitations (+) Sudden daytime somnolence (+) Over-sensitivity of CABG scar (+) Muscular chest pain when lying on his side  All other systems reviewed and are negative.  EKGs/Labs/Other Studies Reviewed:    The following studies were reviewed today:   Cardiac MRI 04/14/21:  FINDINGS: 1. Normal left ventricular size, with LVEDD 53 mm, and LVEDVi 56 mL/m2.   Normal left ventricular thickness, with intraventricular septal thickness of 9 mm, posterior wall thickness of 8 mm, and myocardial mass index of 45 g/m2.   Normal left ventricular systolic function (LVEF =97%). There are no regional wall motion abnormalities.   Left ventricular parametric mapping notable for normal T1 and T2. No recent HCT for ECV analysis.   There is late gadolinium enhancement in the left ventricular myocardium: There is basal anterior mid myocardial LGE. This does not meet a 5 Standard deviation criteria.   2. Normal right  ventricular size with RVEDVI 67 mL/m2.   Normal right ventricular thickness.   Normal right ventricular systolic function (RVEF = 51%). There are no regional wall motion abnormalities or aneurysms.   3.  Normal left atrial size and mild right atrial enlargement.   There is a left atrial mass stuck on to the mid-mid portion of the left atrium at the fossa ovalis.   Dimensions 23 mm X 23 mm X 25 mm.   Lesion is mobile but does not pass through the mitral valve.   There is heterogeneous intermediate T1 signal but hyperintense T2 signal.   This is consistent with a left atrial myxoma   4. Normal size of the aortic root, ascending aorta and pulmonary artery.   5. Valve assessment:   Aortic Valve: Tri-leaflet aortic valve. Qualitatively no significant regurgitation.   Pulmonic Valve: Qualitatively no significant regurgitation.   Tricuspid Valve: Qualitatively no significant regurgitation.  Mitral Valve: No significant regurgitation.   6. Normal pericardium. No pericardial effusion. Prominent pericardial fat.   7. Grossly, no extracardiac findings. Recommended dedicated study if concerned for non-cardiac pathology.   8.  Wrap around artifact noted.   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   Thre is basal anterior mid myocardial LGE. Though not consistent with cardiac sarcoidosis, may be a foci for PVCs.  CT Coronary Morph 03/20/21: Minimal mixed non-obstructive ostial LAD disease, CADRADS = 1.   Coronary calcium score of 33. This was 60th percentile for age and sex matched control.  Long Term Monitor 03/13/21:  Sinus rhythm, average heartbeat 75 bpm. 15 atrial tachycardia runs longest 15 beats with an average of 116 bpm. Atrial bigeminy noted, PVCs, couplets, triplets noted Brief episode of atrial fibrillation with rapid ventricular response noted 9 minutes and 15 seconds with heart rate ranging 132 to 213 bpm. Average was 168. During that episode  there were a few short runs of wide-complex tachycardia noted, some appeared to be following a long short pattern of aberancy. There was another episode on 03/06/2021 at 11:30 AM while in normal sinus rhythm with frequent PACs, PVCs, triplets in rapid sequence. Brief episodes of ventricular tachycardia.   Prior office notes reviewed.  Lab work reviewed, sodium 138 potassium 4.1 creatinine 1.1.  Total cholesterol 180 triglycerides 95 HDL 44 LDL 133.  EKG:   EKG is personally reviewed.  09/08/21: EKG was not ordered.   02/21/21: 67 bpm with no other abnormalities.  Normal intervals.  During 1 episode while at the barn, apple watch EKG showed sinus rhythm with frequent PVCs.  Recent Labs: 05/08/2021: ALT 53 05/18/2021: Magnesium 2.1 05/20/2021: Hemoglobin 9.0; Platelets 459 06/15/2021: BUN 12; Creatinine, Ser 1.13; Potassium 4.5; Sodium 137  Recent Lipid Panel No results found for: "CHOL", "TRIG", "HDL", "CHOLHDL", "VLDL", "LDLCALC", "LDLDIRECT"   Risk Assessment/Calculations:   STOP-Bang Score:  6           Physical Exam:    VS:  BP 90/60 (BP Location: Left Arm, Patient Position: Sitting, Cuff Size: Normal)   Pulse 69   Ht '5\' 9"'$  (1.753 m)   Wt 234 lb (106.1 kg)   SpO2 95%   BMI 34.56 kg/m     Wt Readings from Last 3 Encounters:  03/09/22 234 lb (106.1 kg)  11/14/21 220 lb (99.8 kg)  10/05/21 219 lb (99.3 kg)     GEN:  Well nourished, well developed in no acute distress HEENT: Normal NECK: No JVD; No carotid bruits LYMPHATICS: No lymphadenopathy CARDIAC: RRR, no murmurs, no rubs, gallops; CABG scar well healed. RESPIRATORY:  Clear to auscultation without rales, wheezing or rhonchi  ABDOMEN: Soft, non-tender, non-distended MUSCULOSKELETAL:  No edema; No deformity  SKIN: Warm and dry NEUROLOGIC:  Alert and oriented x 3 PSYCHIATRIC:  Normal affect   ASSESSMENT:    1. Paroxysmal atrial fibrillation (HCC)   2. Daytime somnolence   3. 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 Roxan Hockey.  Doing well.   Paroxysmal atrial fibrillation (HCC) Post Maze procedure, left atrial appendage clipping.  We stopped amiodarone in 09/2021.  Long half-life.   We will continue with Eliquis for now.  ZIO monitor.  If atrial fibrillation undetected, may be able to come off of Eliquis.  He will have an occasional PAC noted.  Continue with low-dose metoprolol. Can take extra metop if needed  for PVC.   Coronary artery disease involving native coronary artery of native heart without angina pectoris Minimal coronary plaque LAD.  At future date consider Crestor for further stabilization.   Daytime somnolence Sleep study, did not have sufficient REM sleep, so it was inconclusive.   This to be a great idea given his previous atrial fibrillation as well.  He notes that he occasionally will be working on the computer and the next thing he knows he is falling asleep.  Daytime somnolence.  Snoring has been heard in the past. We will go ahead and try a home sleep study.     Follow up: 6 months   Medication Adjustments/Labs and Tests Ordered: Current medicines are reviewed at length with the patient today.  Concerns regarding medicines are outlined above.  Orders Placed This Encounter  Procedures   LONG TERM MONITOR (3-14 DAYS)   [place] Sleep Study   No orders of the defined types were placed in this encounter.   Patient Instructions  Medication Instructions:  The current medical regimen is effective;  continue present plan and medications.  *If you need a refill on your cardiac medications before your next appointment, please call your pharmacy*  Testing/Procedures: [place] Monitor Instructions  Your physician has requested you wear a ZIO patch monitor for 14 days.  This is a single patch monitor. [employer] supplies one patch monitor per enrollment. Additional stickers are not available. Please do not  apply patch if you will be having a Nuclear Stress Test,  Echocardiogram, Cardiac CT, MRI, or Chest Xray during the period you would be wearing the  monitor. The patch cannot be worn during these tests. You cannot remove and re-apply the  ZIO XT patch monitor.  Your ZIO patch monitor will be mailed 3 day USPS to your address on file. It may take 3-5 days  to receive your monitor after you have been enrolled.  Once you have received your monitor, please review the enclosed instructions. Your monitor  has already been registered assigning a specific monitor serial # to you.  Billing and Patient Assistance Program Information  We have supplied [employer] with any of your insurance information on file for billing purposes. [employer] offers a sliding scale Patient Assistance Program for patients that do not have  insurance, or whose insurance does not completely cover the cost of the ZIO monitor.  You must apply for the Patient Assistance Program to qualify for this discounted rate.  To apply, please call [employer] at [phone], select option 4, select option 2, ask to apply for  Patient Assistance Program. Theodore Demark will ask your household income, and how many people  are in your household. They will quote your out-of-pocket cost based on that information.  [employer] will also be able to set up a 55-month interest-free payment plan if needed.  Applying the monitor   Shave hair from upper left chest.  Hold abrader disc by orange tab. Rub abrader in 40 strokes over the upper left chest as  indicated in your monitor instructions.  Clean area with 4 enclosed alcohol pads. Let dry.  Apply patch as indicated in monitor instructions. Patch will be placed under collarbone on left  side of chest with arrow pointing upward.  Rub patch adhesive wings for 2 minutes. Remove white label marked "1". Remove the white  label marked "2". Rub patch adhesive wings for 2 additional minutes.  While looking in a mirror,  press and release button in center of patch.  A small green light will  flash 3-4 times. This will be your only indicator that the monitor has been turned on.  Do not shower for the first 24 hours. You may shower after the first 24 hours.  Press the button if you feel a symptom. You will hear a small click. Record Date, Time and  Symptom in the Patient Logbook.  When you are ready to remove the patch, follow instructions on the last 2 pages of Patient  Logbook. Stick patch monitor onto the last page of Patient Logbook.  Place Patient Logbook in the blue and white box. Use locking tab on box and tape box closed  securely. The blue and white box has prepaid postage on it. Please place it in the mailbox as  soon as possible. Your physician should have your test results approximately 7 days after the  monitor has been mailed back to [facility].  Call [place] at [phone] if you have questions regarding  your ZIO XT patch monitor. Call them immediately if you see an orange light blinking on your  monitor.  If your monitor falls off in less than 4 days, contact our Monitor department at [phone].  If your monitor becomes loose or falls off after 4 days call [employer] at [phone] for  suggestions on securing your monitor  Your physician has recommended that you have a sleep study. This test records several body functions during sleep, including: brain activity, eye movement, oxygen and carbon dioxide blood levels, heart rate and rhythm, breathing rate and rhythm, the flow of air through your mouth and nose, snoring, body muscle movements, and chest and belly movement.  Follow-Up: At [facility], you and your health needs are our priority.  As part of our continuing mission to provide you with exceptional heart care, we have created designated Provider Care Teams.  These Care Teams include your primary Cardiologist (physician) and Advanced Practice Providers  (APPs -  Physician Assistants and Nurse Practitioners) who all work together to provide you with the care you need, when you need it.  We recommend signing up for the patient portal called "MyChart".  Sign up information is provided on this After Visit Summary.  MyChart is used to connect with patients for Virtual Visits (Telemedicine).  Patients are able to view lab/test results, encounter notes, upcoming appointments, etc.  Non-urgent messages can be sent to your provider as well.   To learn more about what you can do with MyChart, go to [email or web address].    Your next appointment:   6 month(s)  The format for your next appointment:   In Person  Provider:   [name], MD      Important Information About Sugar            Signed, [name], MD  03/09/2022 10:42 AM    [place]

## 2022-03-14 DIAGNOSIS — I480 Paroxysmal atrial fibrillation: Secondary | ICD-10-CM

## 2022-03-19 NOTE — Telephone Encounter (Signed)
Prior Authorization for ITAMAR sent to [employer] via web portal. Tracking Number .  READY- NO PA REQ 

## 2022-03-19 NOTE — Telephone Encounter (Signed)
Called and made the patient aware that he may proceed with the [employer] Sleep Study. PIN # provided to the patient. Patient made aware that he will be contacted after the test has been read with the results and any recommendations. Patient verbalized understanding and thanked me for the call.   Pt has been given PIN# [id number]. Pt states he will do sleep study this week. I did confirm with Dr. Radford Pax if ok to do Itamar same time wearing a ZIO monitor, per Dr. Radford Pax yes ok .

## 2022-03-21 ENCOUNTER — Encounter: Payer: Self-pay | Admitting: Cardiology

## 2022-03-23 ENCOUNTER — Encounter (HOSPITAL_BASED_OUTPATIENT_CLINIC_OR_DEPARTMENT_OTHER): Payer: Federal, State, Local not specified - PPO | Admitting: Cardiology

## 2022-03-23 DIAGNOSIS — R0683 Snoring: Secondary | ICD-10-CM | POA: Diagnosis not present

## 2022-03-23 DIAGNOSIS — G4719 Other hypersomnia: Secondary | ICD-10-CM

## 2022-03-26 ENCOUNTER — Ambulatory Visit: Payer: Federal, State, Local not specified - PPO | Attending: Cardiology

## 2022-03-26 DIAGNOSIS — I480 Paroxysmal atrial fibrillation: Secondary | ICD-10-CM

## 2022-03-26 DIAGNOSIS — R400 Somnolence: Secondary | ICD-10-CM

## 2022-03-26 DIAGNOSIS — R0683 Snoring: Secondary | ICD-10-CM

## 2022-03-26 NOTE — Procedures (Signed)
   SLEEP STUDY REPORT Patient Information Study Date: 03/23/2022 Patient Name: Shawn Suarez Patient ID: [id number] Birth Date: November 28, 1965 Age: 56 Gender: Male BMI: 34.6 (W=233 lb, H=5' 9'') Stopbang: 6 Referring Physician: [name], MD  TEST DESCRIPTION: Home sleep apnea testing was completed using the WatchPat, a Type 1 device, utilizing peripheral arterial tonometry (PAT), chest movement, actigraphy, pulse oximetry, pulse rate, body position and snore. AHI was calculated with apnea and hypopnea using valid sleep time as the denominator. RDI includes apneas, hypopneas, and RERAs. The data acquired and the scoring of sleep and all associated events were performed in accordance with the recommended standards and specifications as outlined in the AASM Manual for the Scoring of Sleep and Associated Events 2.2.0 (2015).   FINDINGS: 1.  No evidence of Obstructive Sleep Apnea with AHI 4.1/hr.  2.  No Central Sleep Apnea. 3.  Oxygen desaturations as low as 83%. 4.  Mild to moderate snoring was present. O2 sats were < 88% for 0 minutes. 5.  Total sleep time was 6 hrs and 53 min. 6.  23.5% of total sleep time was spent in REM sleep.  7.  Normal sleep onset latency at 17 min.  8.  Shortened REM sleep onset latency at 26 min.  9.  Total awakenings were 9.   DIAGNOSIS:  Normal study with no significant sleep disordered breathing.  RECOMMENDATIONS:   1. Normal study with no significant sleep disordered breathing.  2.  Healthy sleep recommendations include:  adequate nightly sleep (normal 7-9 hrs/night), avoidance of caffeine after noon and alcohol near bedtime, and maintaining a sleep environment that is cool, dark and quiet.  3.  Weight loss for overweight patients is recommended.    4.  Snoring recommendations include:  weight loss where appropriate, side sleeping, and avoidance of alcohol before bed.  5.  Operation of motor vehicle or dangerous equipment must be avoided when feeling  drowsy, excessively sleepy, or mentally fatigued.    6.  An ENT consultation which may be useful for specific causes of and possible treatment of bothersome snoring.   7. Weight loss may be of benefit in reducing the severity of snoring.   Signature: Fransico Him, MD; [facility]; [place], [place] Board of Sleep Medicine Electronically Signed: 03/26/2022

## 2022-04-03 ENCOUNTER — Telehealth: Payer: Self-pay | Admitting: *Deleted

## 2022-04-03 NOTE — Telephone Encounter (Signed)
The patient has been notified of the result and verbalized understanding.  All questions (if any) were answered. Marolyn Hammock, [place] 04/03/2022 6:46 PM     Pt is aware and agreeable to normal results.

## 2022-04-03 NOTE — Telephone Encounter (Signed)
-----   Message from Lauralee Evener, [place] sent at 03/27/2022  8:13 AM EST -----  ----- Message ----- From: Sueanne Margarita, MD Sent: 03/26/2022   6:23 PM EST To: Cv Div Sleep Studies  Please let patient know that sleep study showed no significant sleep apnea.

## 2022-04-04 ENCOUNTER — Encounter: Payer: Self-pay | Admitting: Cardiology

## 2022-04-06 NOTE — Telephone Encounter (Signed)
Discontinued Eliquis per MD:   [name],   I think it would be fine to discontinue the Eliquis based upon the reassuring monitor. No taper required.  Obviously if flutter or atrial fibrillation were to return, we would need to resume Eliquis.   Happy New Year, [name], MD

## 2022-05-07 DIAGNOSIS — R7303 Prediabetes: Secondary | ICD-10-CM | POA: Diagnosis not present

## 2022-05-07 DIAGNOSIS — Z0000 Encounter for general adult medical examination without abnormal findings: Secondary | ICD-10-CM | POA: Diagnosis not present

## 2022-05-07 DIAGNOSIS — E7800 Pure hypercholesterolemia, unspecified: Secondary | ICD-10-CM | POA: Diagnosis not present

## 2022-05-07 DIAGNOSIS — Z8546 Personal history of malignant neoplasm of prostate: Secondary | ICD-10-CM | POA: Diagnosis not present

## 2022-05-18 DIAGNOSIS — I4891 Unspecified atrial fibrillation: Secondary | ICD-10-CM | POA: Diagnosis not present

## 2022-05-18 DIAGNOSIS — K219 Gastro-esophageal reflux disease without esophagitis: Secondary | ICD-10-CM | POA: Diagnosis not present

## 2022-05-18 DIAGNOSIS — Z8546 Personal history of malignant neoplasm of prostate: Secondary | ICD-10-CM | POA: Diagnosis not present

## 2022-05-18 DIAGNOSIS — R7303 Prediabetes: Secondary | ICD-10-CM | POA: Diagnosis not present

## 2022-11-22 ENCOUNTER — Other Ambulatory Visit: Payer: Self-pay | Admitting: Cardiology

## 2022-12-27 ENCOUNTER — Ambulatory Visit: Payer: Federal, State, Local not specified - PPO | Admitting: Sports Medicine

## 2022-12-27 VITALS — BP 112/78 | Ht 69.0 in | Wt 229.0 lb

## 2022-12-27 DIAGNOSIS — M722 Plantar fascial fibromatosis: Secondary | ICD-10-CM | POA: Diagnosis not present

## 2022-12-28 NOTE — Progress Notes (Unsigned)
PCP: Tally Joe, MD  SUBJECTIVE:   HPI:  Shawn Suarez is a 57 y.o. male here for custom orthotics. He is a retired Investment banker, operational and states he has dealt with plantar fasciitis since the early 2000s. He typically manages with stretches and orthotics. He had orthotics made in our clinic back in 2010 that are worn through and no longer comfortable. He had a new pair made at the [place] recently but transitioning to these has flared the pain in his left heel.   ROS:     See HPI, otherwise negative except as documented above.  PERTINENT  PMH / PSH FH / / SH:  Past Medical, Surgical, Social, and Family History Reviewed & Updated in the EMR.  Pertinent findings include:  Non-contributory  No Known Allergies   OBJECTIVE:  BP 112/78   Ht 5\' 9"  (1.753 m)   Wt 229 lb (103.9 kg)   BMI 33.82 kg/m   PHYSICAL EXAM:  GEN: Alert and Oriented, NAD, comfortable in exam room RESP: Unlabored respirations, symmetric chest rise PSY: normal mood, congruent affect   MSK EXAM: Bilateral feet fairly neutral and narrow in positioning. Rear-mid foot dominant. Mild splaying of toes 1-2 bilaterally and 2-3 on right. Mild hammering of left 2nd toe. 5th toes turned inward.     ASSESSMENT & PLAN:  Plantar fasciitis of left foot  Manages with orthotics and stretches. Reviewed stretches with the patient again today. Orthotics provided as described below. He will follow-up PRN.  Patient was fitted for a: standard, cushioned, semi-rigid orthotic. The orthotic was heated and afterward the patient stood on the orthotic blank positioned on the orthotic stand. The patient was positioned in subtalar neutral position and 10 degrees of ankle dorsiflexion in a weight bearing stance on the heated orthotic blank. After completion of molding, a stable base was applied to the orthotic blank. The blank was ground to a stable position for weight bearing. Size: 13 Base: Blue med density EVA Posting: 1st ray posts  bilaterally Additional orthotic padding: none    Glean Salen, MD PGY-4, Sports Medicine Fellow [facility]  I observed and examined the patient with the [facility] resident and agree with assessment and plan.  Note reviewed and modified by me. Sterling Big, MD

## 2023-01-11 ENCOUNTER — Ambulatory Visit: Payer: Federal, State, Local not specified - PPO | Attending: Cardiology | Admitting: Cardiology

## 2023-01-11 ENCOUNTER — Encounter: Payer: Self-pay | Admitting: Cardiology

## 2023-01-11 VITALS — BP 116/60 | HR 99 | Ht 69.0 in | Wt 232.0 lb

## 2023-01-11 DIAGNOSIS — I480 Paroxysmal atrial fibrillation: Secondary | ICD-10-CM

## 2023-01-11 DIAGNOSIS — R002 Palpitations: Secondary | ICD-10-CM

## 2023-01-11 DIAGNOSIS — I5189 Other ill-defined heart diseases: Secondary | ICD-10-CM

## 2023-01-11 NOTE — Patient Instructions (Signed)

## 2023-01-11 NOTE — Progress Notes (Signed)
Cardiology Office Note:  .   Date:  01/11/2023  ID:  Trisha Mangle, DOB 1965/06/08, MRN [id number] PCP: Tally Joe, MD  [facility] Providers Cardiologist:  Donato Schultz, MD    History of Present Illness: .   Shawn Suarez is a 57 y.o. male Discussed with the use of AI scribe software   History of Present Illness   The patient, with a history of cardiac issues, presents with ongoing palpitations and occasional atrial fibrillation (Afib) episodes. He describes the palpitations as runs of rapid heartbeats, lasting approximately ten seconds, without associated fainting. The Afib episodes, which the patient can self-identify, are triggered by stress and are characterized by an irregular heartbeat with a rate up to 90 beats per minute. These episodes are usually short-lived and self-resolving.  The patient has previously undergone a maze procedure and left atrial appendage clipping for his cardiac condition. Despite these interventions, the patient continues to experience cardiac symptoms, albeit not severe enough to warrant immediate concern or further intervention.  In addition to his cardiac issues, the patient has been struggling with weight gain. He has recently started a new weight loss medication, Cagrisima, as part of a study. Since starting the medication in March, the patient has lost approximately twenty pounds. However, he reports increased fatigue and a persistent desire to sleep during the afternoon.  The patient also reports a recent flare-up of plantar fasciitis, which had been well-controlled for several years with orthotics. The flare-up occurred after attempting to get new orthotics from the [place], which resulted in increased pain and difficulty walking. The patient has since returned to his previous orthotics provider and is gradually improving.  The patient's family history is significant, with his daughter experiencing severe anxiety. The [facility] himself [facility] issues, which has been a source of stress. 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   Home sleep study unremarkable.          ROS: Denies chest pain  Studies Reviewed: Marland Kitchen   EKG Interpretation Date/Time:  Friday January 11 2023 15:07:14 EDT Ventricular Rate:  97 PR Interval:  124 QRS Duration:  76 QT Interval:  332 QTC Calculation: 421 R Axis:   15  Text Interpretation: Normal sinus rhythm Inferior infarct (cited on or before 08-May-2021) When compared with ECG of 10-May-2021 18:55, Vent. rate has increased BY  33 BPM Confirmed by Donato Schultz ([id number]) on 01/11/2023 3:22:00 PM     Risk Assessment/Calculations:        STOP-Bang Score:  6      Physical Exam:   VS:  BP 116/60   Pulse 99   Ht 5\' 9"  (1.753 m)   Wt 232 lb (105.2 kg)   SpO2 96%   BMI 34.26 kg/m    Wt Readings from Last 3 Encounters:  01/11/23 232 lb (105.2 kg)  12/27/22 229 lb (103.9 kg)  03/09/22 234 lb (106.1 kg)    GEN: Well nourished, well developed in no acute distress NECK: No JVD; No carotid bruits CARDIAC: RRR, no murmurs, no rubs, no gallops RESPIRATORY:  Clear to auscultation without rales, wheezing or rhonchi  ABDOMEN: Soft, non-tender, non-distended EXTREMITIES:  No edema; No deformity   ASSESSMENT AND PLAN: .    Assessment and Plan    Atrial Fibrillation Reports of intermittent palpitations and episodes of atrial fibrillation, particularly during periods of stress. Episodes are self-limiting  and not associated with syncope. Patient has previously undergone a maze procedure and left atrial appendage clipping. -Continue current management. -Consider referral to electrophysiology if frequency or severity of episodes increases.  Atrial myxoma resection-doing well.  Premature Ventricular Contractions (PVCs) Reports of occasional runs of PVCs, not associated with  syncope. -Continue current management.  Weight Management Patient has been participating in a study for a weight loss medication (Cagrisima) since March 2024 and has lost approximately 20 pounds. However, reports increased fatigue and desire to sleep during the afternoon. -Continue current medication and monitor for side effects. -Consider further evaluation if fatigue persists or worsens.  Plantar Fasciitis Reports of plantar fasciitis, exacerbated by inappropriate orthotics. Currently improving with new orthotics. -Continue current management.  Follow-up in 1 year unless changes occur.        Signed, Donato Schultz, MD

## 2023-02-12 ENCOUNTER — Encounter: Payer: Self-pay | Admitting: Cardiology

## 2023-02-13 DIAGNOSIS — D225 Melanocytic nevi of trunk: Secondary | ICD-10-CM | POA: Diagnosis not present

## 2023-02-13 DIAGNOSIS — D492 Neoplasm of unspecified behavior of bone, soft tissue, and skin: Secondary | ICD-10-CM | POA: Diagnosis not present

## 2023-02-13 DIAGNOSIS — L821 Other seborrheic keratosis: Secondary | ICD-10-CM | POA: Diagnosis not present

## 2023-02-13 DIAGNOSIS — L814 Other melanin hyperpigmentation: Secondary | ICD-10-CM | POA: Diagnosis not present

## 2023-02-13 DIAGNOSIS — L570 Actinic keratosis: Secondary | ICD-10-CM | POA: Diagnosis not present

## 2023-02-13 DIAGNOSIS — Z7189 Other specified counseling: Secondary | ICD-10-CM | POA: Diagnosis not present

## 2023-02-14 ENCOUNTER — Encounter: Payer: Self-pay | Admitting: *Deleted

## 2023-02-14 NOTE — Telephone Encounter (Signed)
    To whom it may concern:  Mr. 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   Please reach out if needed,   Donato Schultz, MD

## 2023-05-09 DIAGNOSIS — R7303 Prediabetes: Secondary | ICD-10-CM | POA: Diagnosis not present

## 2023-05-09 DIAGNOSIS — J4520 Mild intermittent asthma, uncomplicated: Secondary | ICD-10-CM | POA: Diagnosis not present

## 2023-05-09 DIAGNOSIS — R6882 Decreased libido: Secondary | ICD-10-CM | POA: Diagnosis not present

## 2023-05-09 DIAGNOSIS — Z8546 Personal history of malignant neoplasm of prostate: Secondary | ICD-10-CM | POA: Diagnosis not present

## 2023-05-09 DIAGNOSIS — Z0000 Encounter for general adult medical examination without abnormal findings: Secondary | ICD-10-CM | POA: Diagnosis not present

## 2023-05-09 DIAGNOSIS — K219 Gastro-esophageal reflux disease without esophagitis: Secondary | ICD-10-CM | POA: Diagnosis not present

## 2023-05-09 DIAGNOSIS — Z08 Encounter for follow-up examination after completed treatment for malignant neoplasm: Secondary | ICD-10-CM | POA: Diagnosis not present

## 2023-05-09 DIAGNOSIS — M25559 Pain in unspecified hip: Secondary | ICD-10-CM | POA: Diagnosis not present

## 2023-05-09 DIAGNOSIS — E7800 Pure hypercholesterolemia, unspecified: Secondary | ICD-10-CM | POA: Diagnosis not present

## 2023-05-09 DIAGNOSIS — E291 Testicular hypofunction: Secondary | ICD-10-CM | POA: Diagnosis not present

## 2023-05-11 ENCOUNTER — Encounter: Payer: Self-pay | Admitting: Cardiology

## 2023-05-13 DIAGNOSIS — R7989 Other specified abnormal findings of blood chemistry: Secondary | ICD-10-CM | POA: Diagnosis not present

## 2023-05-13 DIAGNOSIS — E291 Testicular hypofunction: Secondary | ICD-10-CM | POA: Diagnosis not present

## 2023-05-17 ENCOUNTER — Other Ambulatory Visit: Payer: Self-pay | Admitting: Cardiology

## 2023-05-27 DIAGNOSIS — M7061 Trochanteric bursitis, right hip: Secondary | ICD-10-CM | POA: Diagnosis not present

## 2023-05-27 DIAGNOSIS — M7062 Trochanteric bursitis, left hip: Secondary | ICD-10-CM | POA: Diagnosis not present

## 2023-10-02 ENCOUNTER — Encounter: Payer: Self-pay | Admitting: Urology

## 2023-10-02 ENCOUNTER — Ambulatory Visit: Admitting: Urology

## 2023-10-02 VITALS — BP 115/79 | HR 84 | Ht 69.0 in | Wt 235.0 lb

## 2023-10-02 DIAGNOSIS — R5383 Other fatigue: Secondary | ICD-10-CM | POA: Diagnosis not present

## 2023-10-02 DIAGNOSIS — C61 Malignant neoplasm of prostate: Secondary | ICD-10-CM | POA: Insufficient documentation

## 2023-10-02 DIAGNOSIS — R6882 Decreased libido: Secondary | ICD-10-CM

## 2023-10-02 LAB — URINALYSIS, ROUTINE W REFLEX MICROSCOPIC
Bilirubin, UA: NEGATIVE
Glucose, UA: NEGATIVE
Ketones, UA: NEGATIVE
Leukocytes,UA: NEGATIVE
Nitrite, UA: NEGATIVE
Protein,UA: NEGATIVE
RBC, UA: NEGATIVE
Specific Gravity, UA: 1.015 (ref 1.005–1.030)
Urobilinogen, Ur: 0.2 mg/dL (ref 0.2–1.0)
pH, UA: 7 (ref 5.0–7.5)

## 2023-10-02 NOTE — Progress Notes (Signed)
 Assessment: 1. 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 Fatigue, unspecified type   3. Decreased libido     Plan: I personally reviewed the patient's chart including provider notes, and lab results. I discussed the diagnosis and management of hypogonadism.  Recommendations for treatment for testosterone  levels <300 discussed.  I also discussed possible causes for his symptoms other than a low testosterone . Labs today:  testosterone , SHBG, albumin , LH, prolactin Will contact him with results and to arrange follow-up. Recommend continued follow-up of his prostate cancer with annual PSA testing.  Chief Complaint:  Chief Complaint  Patient presents with   Prostate Cancer    History of Present Illness:  Shawn Suarez is a 58 y.o. male who is seen for evaluation of prostate cancer and generalized fatigue with concerns for possible hypogonadism.SABRA  He was diagnosed with prostate cancer in 2016. 03/09/14 PSA = 3.14 10/06/14 PSA = 3.52 01/17/15 PSA = 3.63 (19.26% free PSA)  10/21/14 DRE = small, normal prostate;  02/24/15 prostate biopsy (TRUS = 31.2 mL) showed 3+3=6 prostate cancer in 2 of 12 biopsy cores (both from left mid) with volumes 6-21%. 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 He was treated with brachytherapy in April 2017. Posttreatment PSA results: 7/17 1.34 10/17 0.72 1/28 0.67 5/18 1.06 11/18 0.62 6/19 0.33 12/19 0.19 2/10 0.13 2/21 0.13 1/22 0.03 12/22 0.03 2/25 0.03  He does not have any lower urinary tract symptoms other than occasional frequency.  No dysuria or gross hematuria. IPSS = 1/0.  He reports symptoms of fatigue, lack of energy, falling asleep easily, decreased physical strength, decreased libido, and ED.  The erectile dysfunction and decreased libido began after his brachytherapy in 2017.  His other symptoms began approximately 2 years ago following his heart surgery.  He had a testosterone  level checked in February  2025 which was 384 with a low free testosterone . ADAM score = 7/10.    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/2021   ZIO monitor 2022-symptomatic   GERD (gastroesophageal reflux disease)    Paroxysmal atrial fibrillation (HCC) 04/19/2021   ZIO monitor-2022- approximately 10 minutes atrial fibrillation rapid ventricular response    Past Surgical History:  Past Surgical History:  Procedure Laterality Date   CHOLECYSTECTOMY     MEDIASTERNOTOMY N/A 05/10/2021   Procedure: MEDIAN STERNOTOMY;  Surgeon: Kerrin Elspeth BROCKS, MD;  Location: [facility] OR;  Service: Open Heart Surgery;  Laterality: N/A;   MINIMALLY INVASIVE EXCISION OF ATRIAL MYXOMA Right 05/10/2021   Procedure: MINIMALLY INVASIVE EXCISION OF ATRIAL MYXOMA;  Surgeon: Kerrin Elspeth BROCKS, MD;  Location: [facility] OR;  Service: Open Heart Surgery;  Laterality: Right;   MINIMALLY INVASIVE MAZE PROCEDURE N/A 05/10/2021   Procedure: PARTIAL CRYOMAZE PROCEDURE;  Surgeon: Kerrin Elspeth BROCKS, MD;  Location[facility] OR;  Service: Open Heart Surgery;  Laterality: N/A;   TEE WITHOUT CARDIOVERSION  05/10/2021   Procedure: TRANSESOPHAGEAL ECHOCARDIOGRAM (TEE);  Surgeon: Kerrin Elspeth BROCKS, MD;  Location: [facility] OR;  Service: Open Heart Surgery;;    Allergies:  No Known Allergies  Family History:  Family History  Problem Relation Age of  Onset   Hypertension Mother    Diabetes Father    Heart attack Neg Hx    Hyperlipidemia Neg Hx    Sudden death Neg Hx     Social History:  Social History   Tobacco Use   Smoking status: Never   Smokeless tobacco: Never   Tobacco comments:    1 can per  1.5 weeks  Vaping Use   Vaping status: Never Used  Substance Use Topics   Alcohol use: Not Currently    Comment: rare   Drug use: Never    Review of symptoms:  Constitutional:  Negative for unexplained weight loss, night sweats, fever, chills ENT:  Negative for nose bleeds, sinus pain, painful swallowing CV:  Negative for chest pain, shortness of breath, exercise intolerance, palpitations, loss of consciousness Resp:  Negative for cough, wheezing, shortness of breath GI:  Negative for nausea, vomiting, diarrhea, bloody stools GU:  Positives noted in HPI; otherwise negative for gross hematuria, dysuria, urinary incontinence Neuro:  Negative for seizures, poor balance, limb weakness, slurred speech Psych:  Negative for lack of energy, depression, anxiety Endocrine:  Negative for polydipsia, polyuria, symptoms of hypoglycemia (dizziness, hunger, sweating) Hematologic:  Negative for anemia, purpura, petechia, prolonged or excessive bleeding, use of anticoagulants  Allergic:  Negative for difficulty breathing or choking as a result of exposure to anything; no shellfish allergy; no allergic response (rash/itch) to materials, foods  Physical exam: BP 115/79   Pulse 84   Ht 5' 9 (1.753 m)   Wt 235 lb (106.6 kg)   BMI 34.70 kg/m  GENERAL APPEARANCE:  Well appearing, well developed, well nourished, NAD HEENT: Atraumatic, Normocephalic, oropharynx clear. NECK: Supple without lymphadenopathy or thyromegaly. LUNGS: Clear to auscultation bilaterally. HEART: Regular Rate and Rhythm without murmurs, gallops, or rubs. ABDOMEN: Soft, non-tender, No Masses. EXTREMITIES: Moves all extremities well.  Without clubbing, cyanosis, or edema. NEUROLOGIC:  Alert and oriented x 3, normal gait, CN II-XII grossly intact.  MENTAL STATUS:  Appropriate. BACK:  Non-tender to palpation.  No CVAT SKIN:  Warm, dry and intact.   GU: Penis:  circumcised Meatus: Normal Scrotum: normal, no masses Testis: normal  without masses bilateral   Results: U/A: Negative

## 2023-10-03 ENCOUNTER — Ambulatory Visit: Payer: Self-pay | Admitting: Urology

## 2023-10-03 DIAGNOSIS — E291 Testicular hypofunction: Secondary | ICD-10-CM

## 2023-10-03 LAB — TESTOSTERONE: Testosterone: 329 ng/dL (ref 264–916)

## 2023-10-03 LAB — SEX HORMONE BINDING GLOBULIN: Sex Hormone Binding: 43.8 nmol/L (ref 19.3–76.4)

## 2023-10-03 LAB — PROLACTIN: Prolactin: 7.8 ng/mL (ref 3.6–25.2)

## 2023-10-03 LAB — ALBUMIN: Albumin: 4.4 g/dL (ref 3.8–4.9)

## 2023-10-03 LAB — LUTEINIZING HORMONE: LH: 2.1 m[IU]/mL (ref 1.7–8.6)

## 2023-10-07 ENCOUNTER — Telehealth: Payer: Self-pay | Admitting: Urology

## 2023-10-07 NOTE — Telephone Encounter (Signed)
LVM FOR PATIENT TO CALL BACK AND SCHEDULE

## 2023-10-07 NOTE — Telephone Encounter (Signed)
-----   Message from Adine Manly sent at 10/03/2023  3:43 PM EDT ----- Please schedule lab visit for testosterone  level in 4-6 weeks.

## 2023-11-04 ENCOUNTER — Other Ambulatory Visit

## 2023-11-04 DIAGNOSIS — E291 Testicular hypofunction: Secondary | ICD-10-CM | POA: Diagnosis not present

## 2023-11-05 ENCOUNTER — Ambulatory Visit: Payer: Self-pay | Admitting: Urology

## 2023-11-05 LAB — TESTOSTERONE: Testosterone: 483 ng/dL (ref 264–916)

## 2023-12-03 IMAGING — DX DG CHEST 2V
2 series · 2 of 2 positions shown · non-contrast
Comparison: 08/08/2016

CLINICAL DATA: 56-year-old male with a history of atrial mass

EXAM:
CHEST - 2 VIEW

[w chest pa]
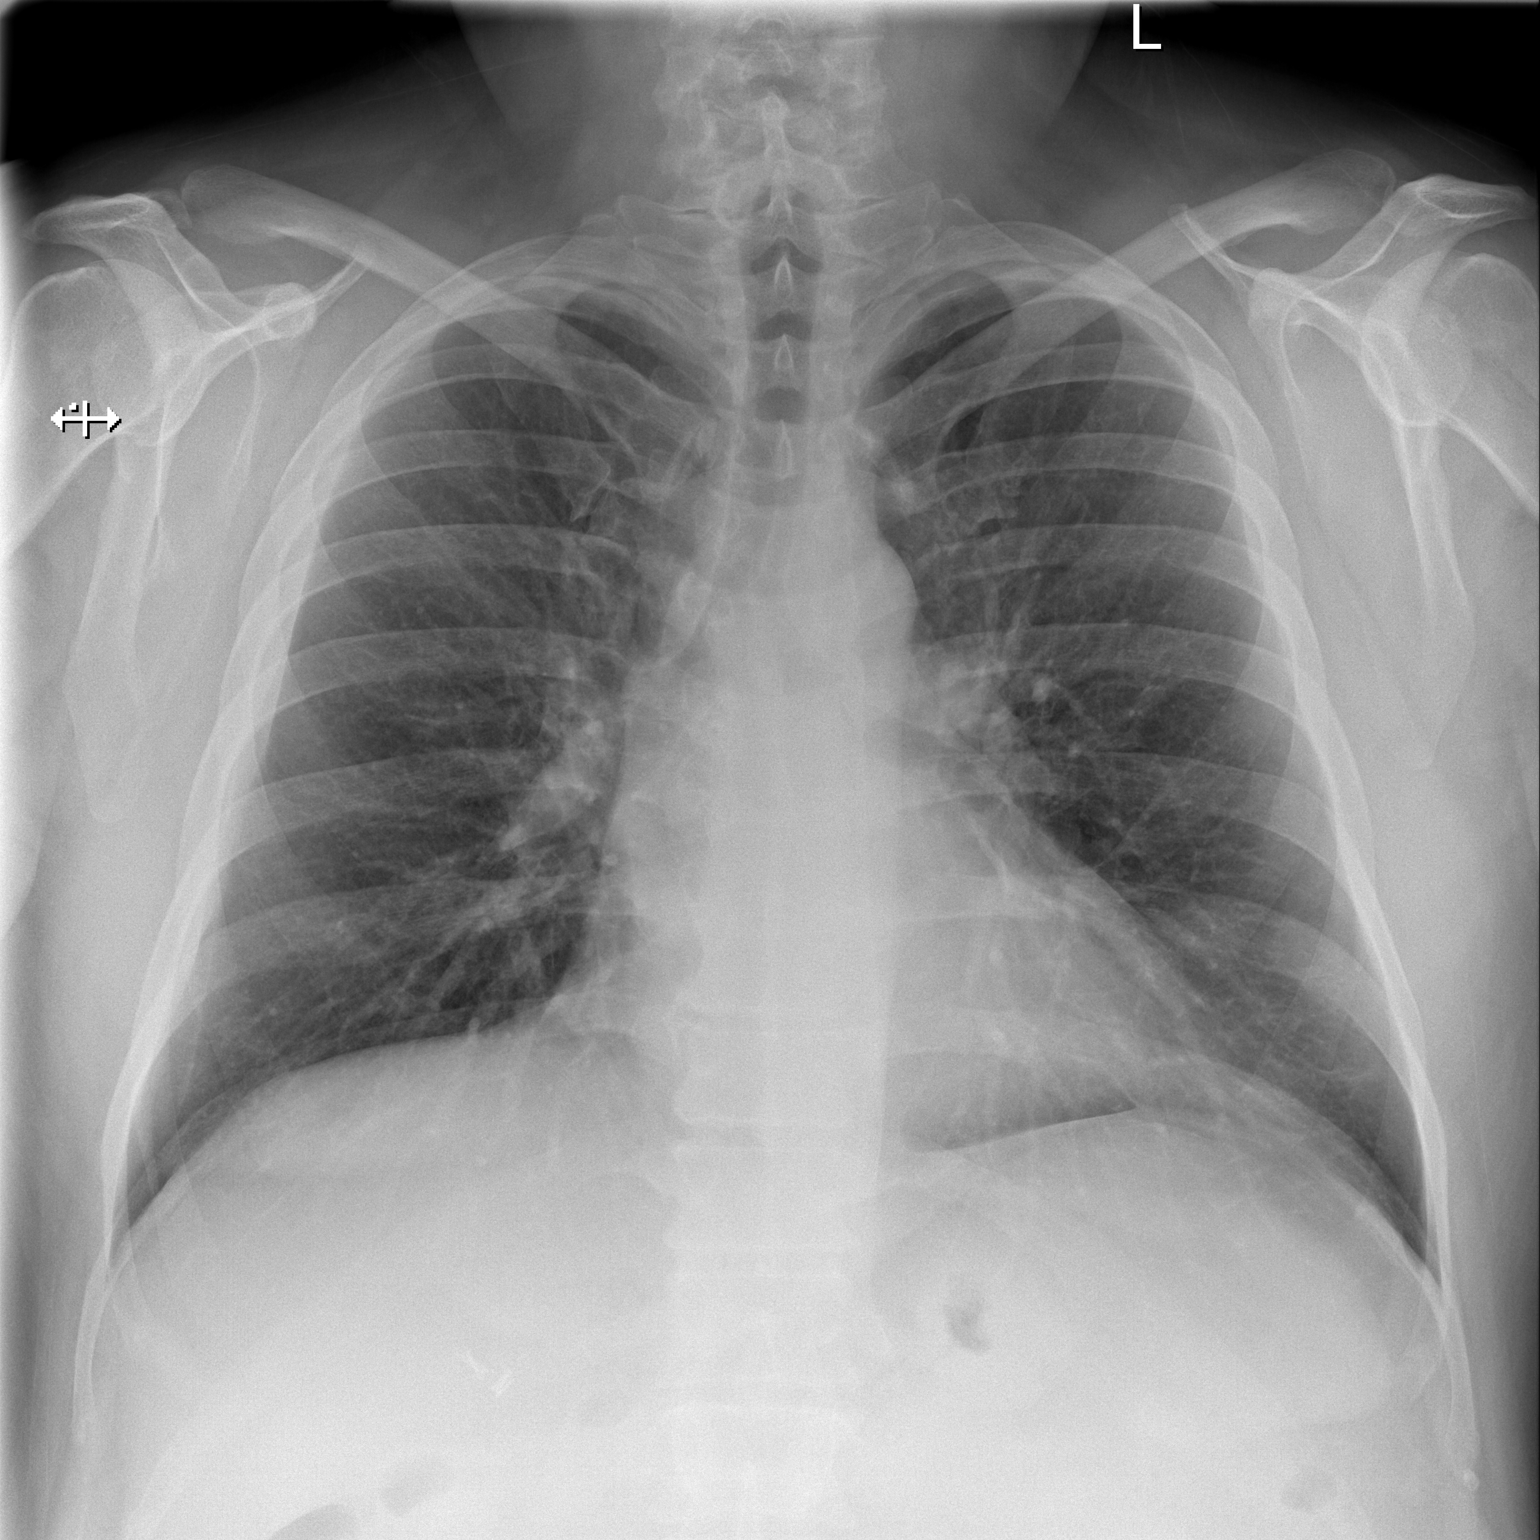

[w chest lat]
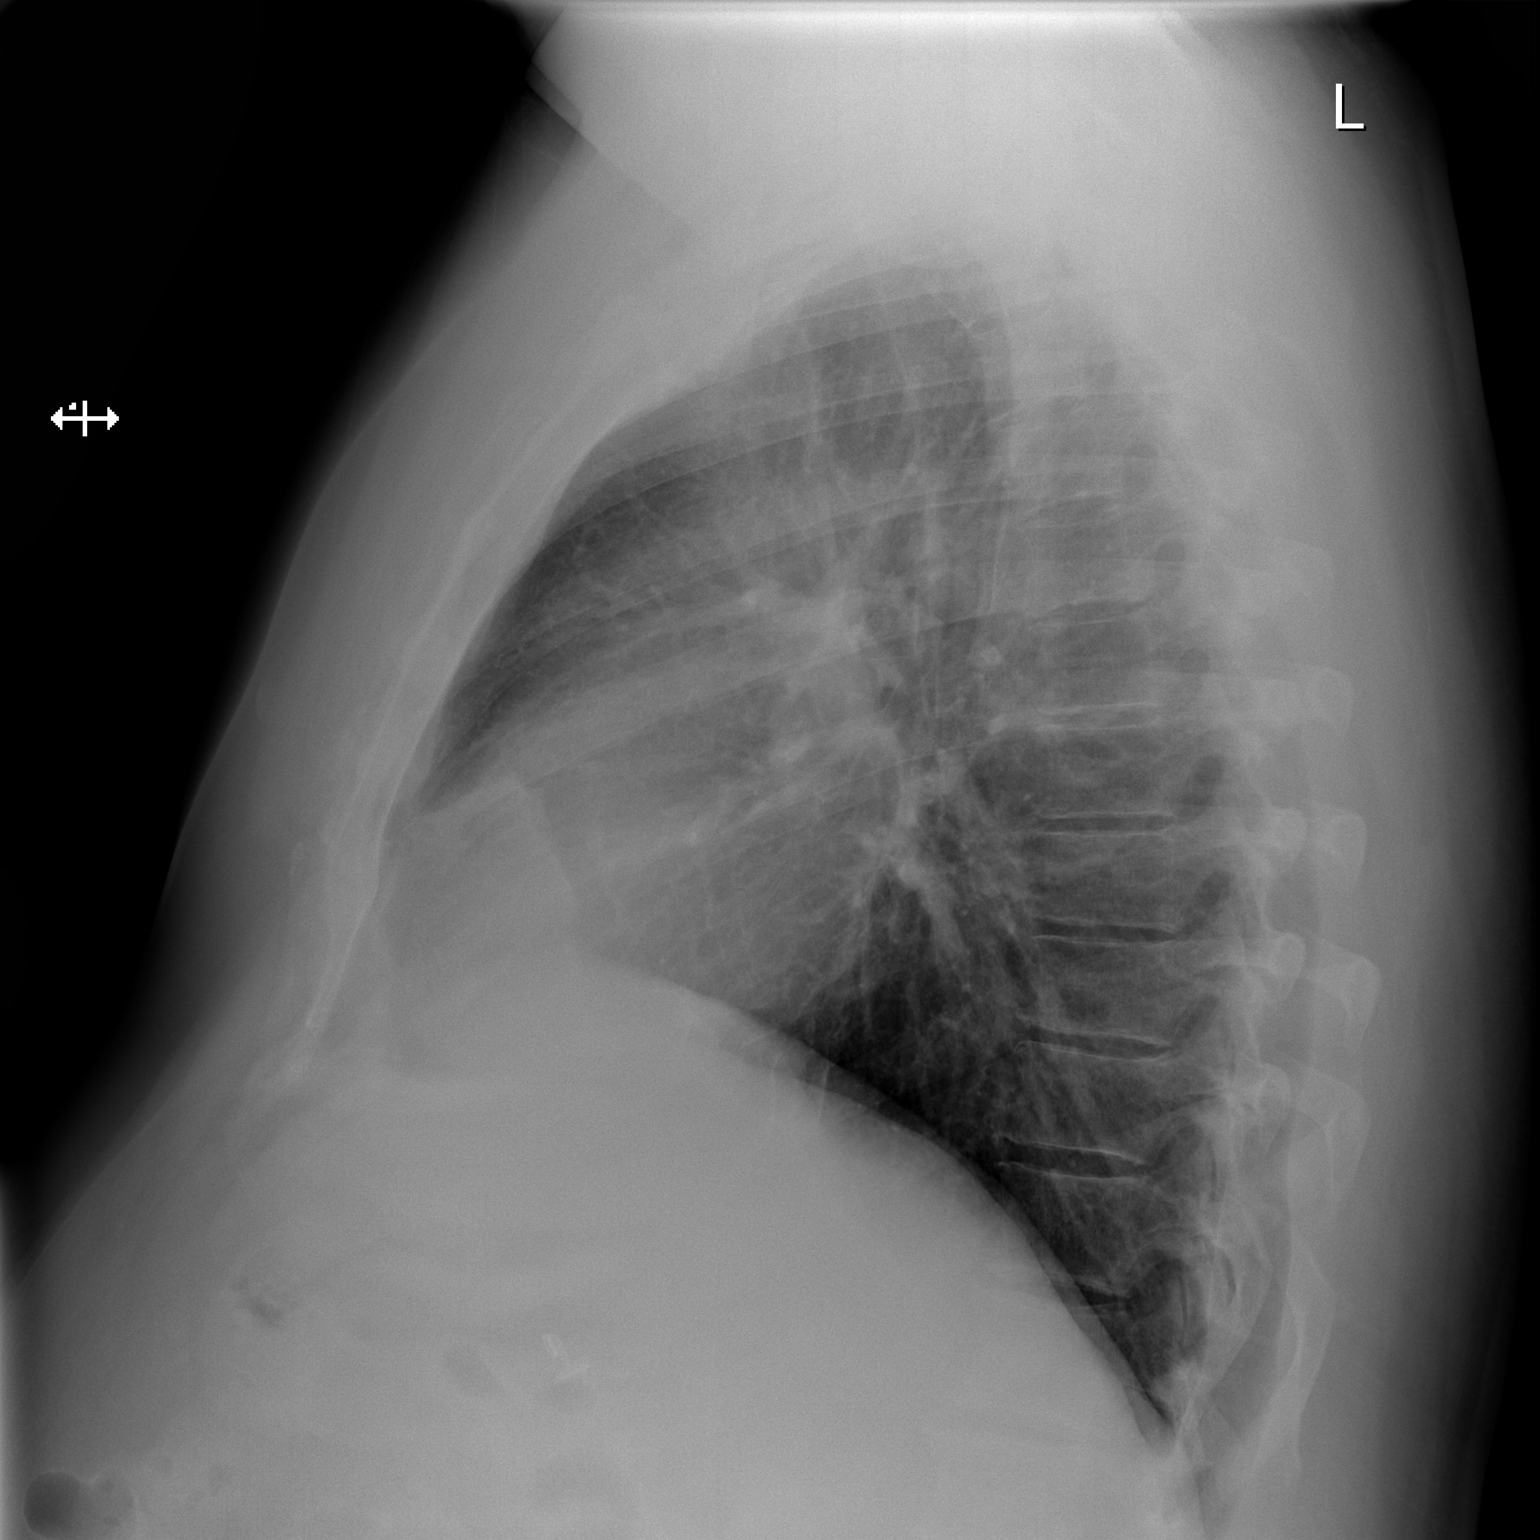

[2 of 2 positions shown; findings below may reference images not displayed]

FINDINGS: Cardiomediastinal silhouette unchanged in size and contour. No
evidence of central vascular congestion. No interlobular septal
thickening.

No pneumothorax or pleural effusion. Coarsened interstitial
markings, with no confluent airspace disease.

No acute displaced fracture. Degenerative changes of the spine.
IMPRESSION: No active cardiopulmonary disease.

## 2023-12-06 IMAGING — DX DG CHEST 1V PORT
1 series · 1 of 1 positions shown · non-contrast
Comparison: Chest radiograph from one day prior.

CLINICAL DATA: Status post open cardiac surgery, chest tube present

EXAM:
PORTABLE CHEST 1 VIEW

[chest]
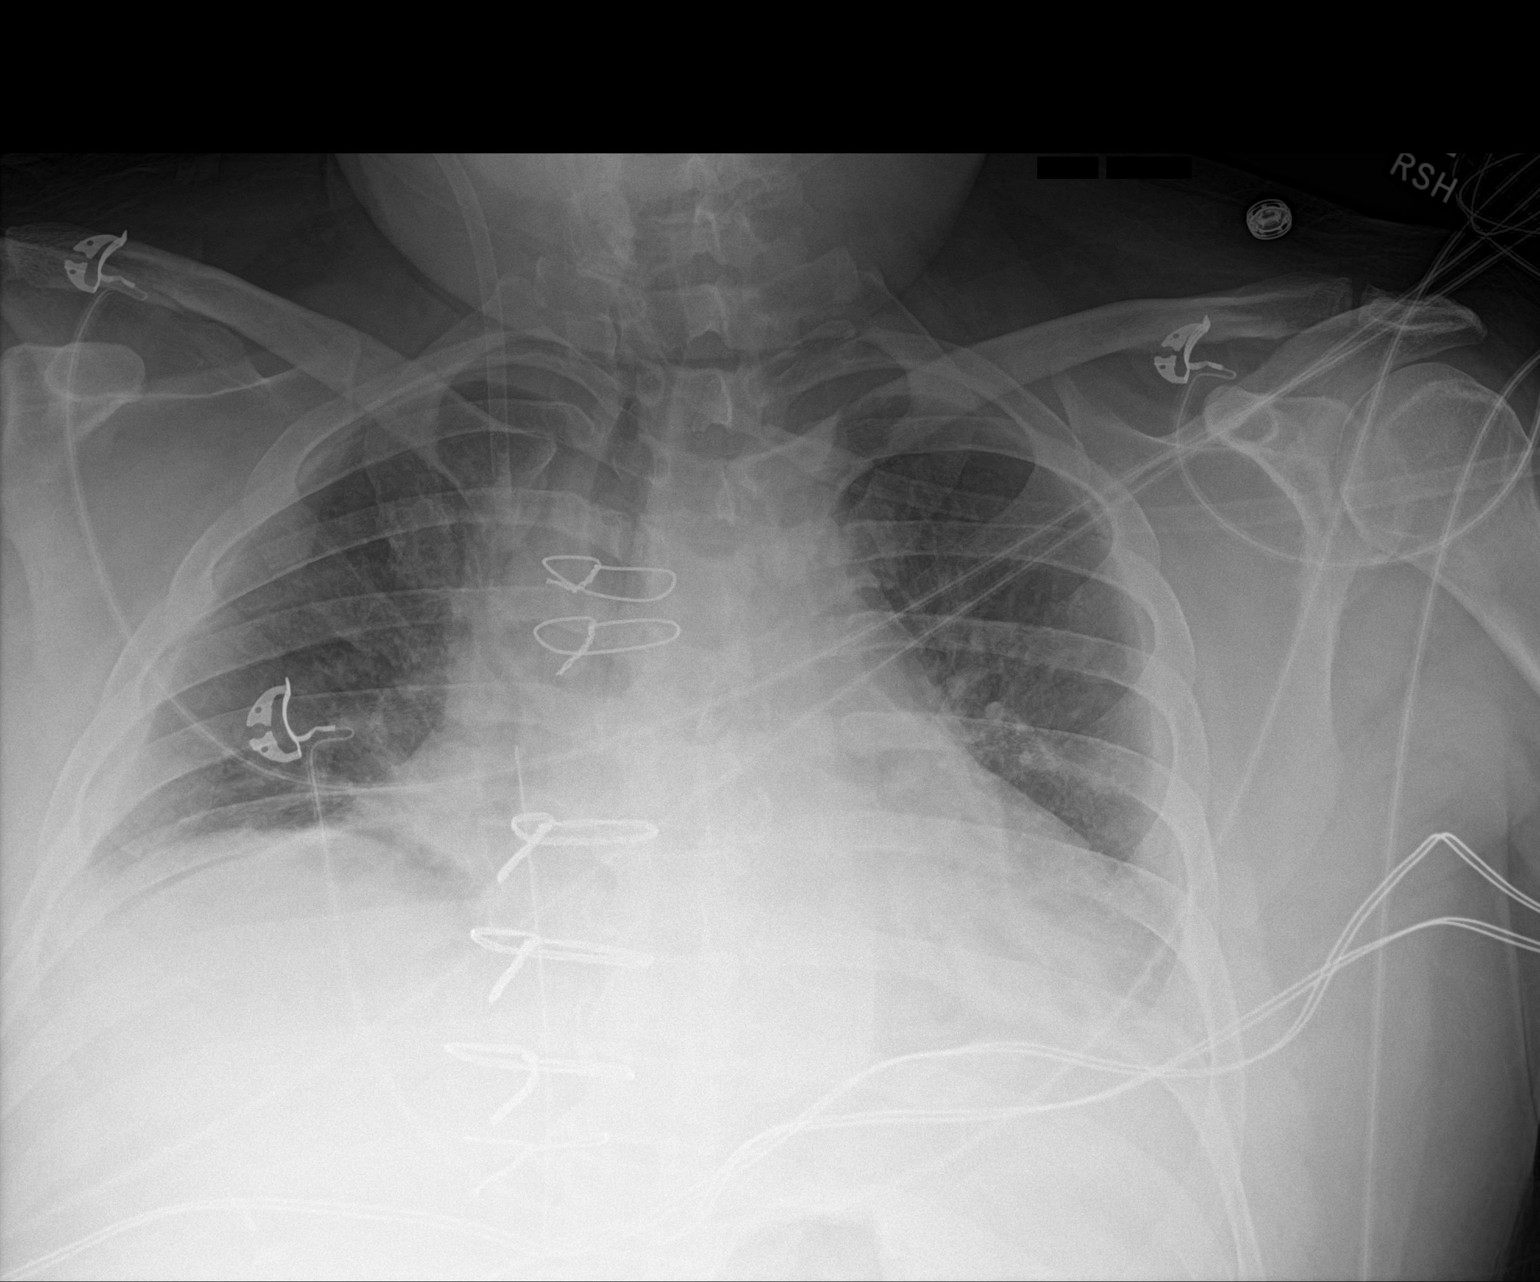

[1 of 1 positions shown; findings below may reference images not displayed]

FINDINGS: Right internal jugular central venous sheath terminates over the
right thoracic inlet. Intact sternotomy wires. Stable tube overlying
the right heart. Stable cardiomediastinal silhouette with mild
cardiomegaly. No pneumothorax. No pleural effusion. Low lung
volumes, vascular crowding and moderate bibasilar atelectasis,
unchanged. No overt pulmonary edema.
IMPRESSION: 1. No pneumothorax.
2. Stable low lung volumes and moderate bibasilar atelectasis.
3. Stable mild cardiomegaly without overt pulmonary edema.

## 2023-12-15 IMAGING — CR DG CHEST 2V
2 series · 2 of 2 positions shown · non-contrast
Comparison: Chest x-ray 05/13/2021.

CLINICAL DATA: 56-year-old male with history of cardiac surgery.

EXAM:
CHEST - 2 VIEW

[chest pa]
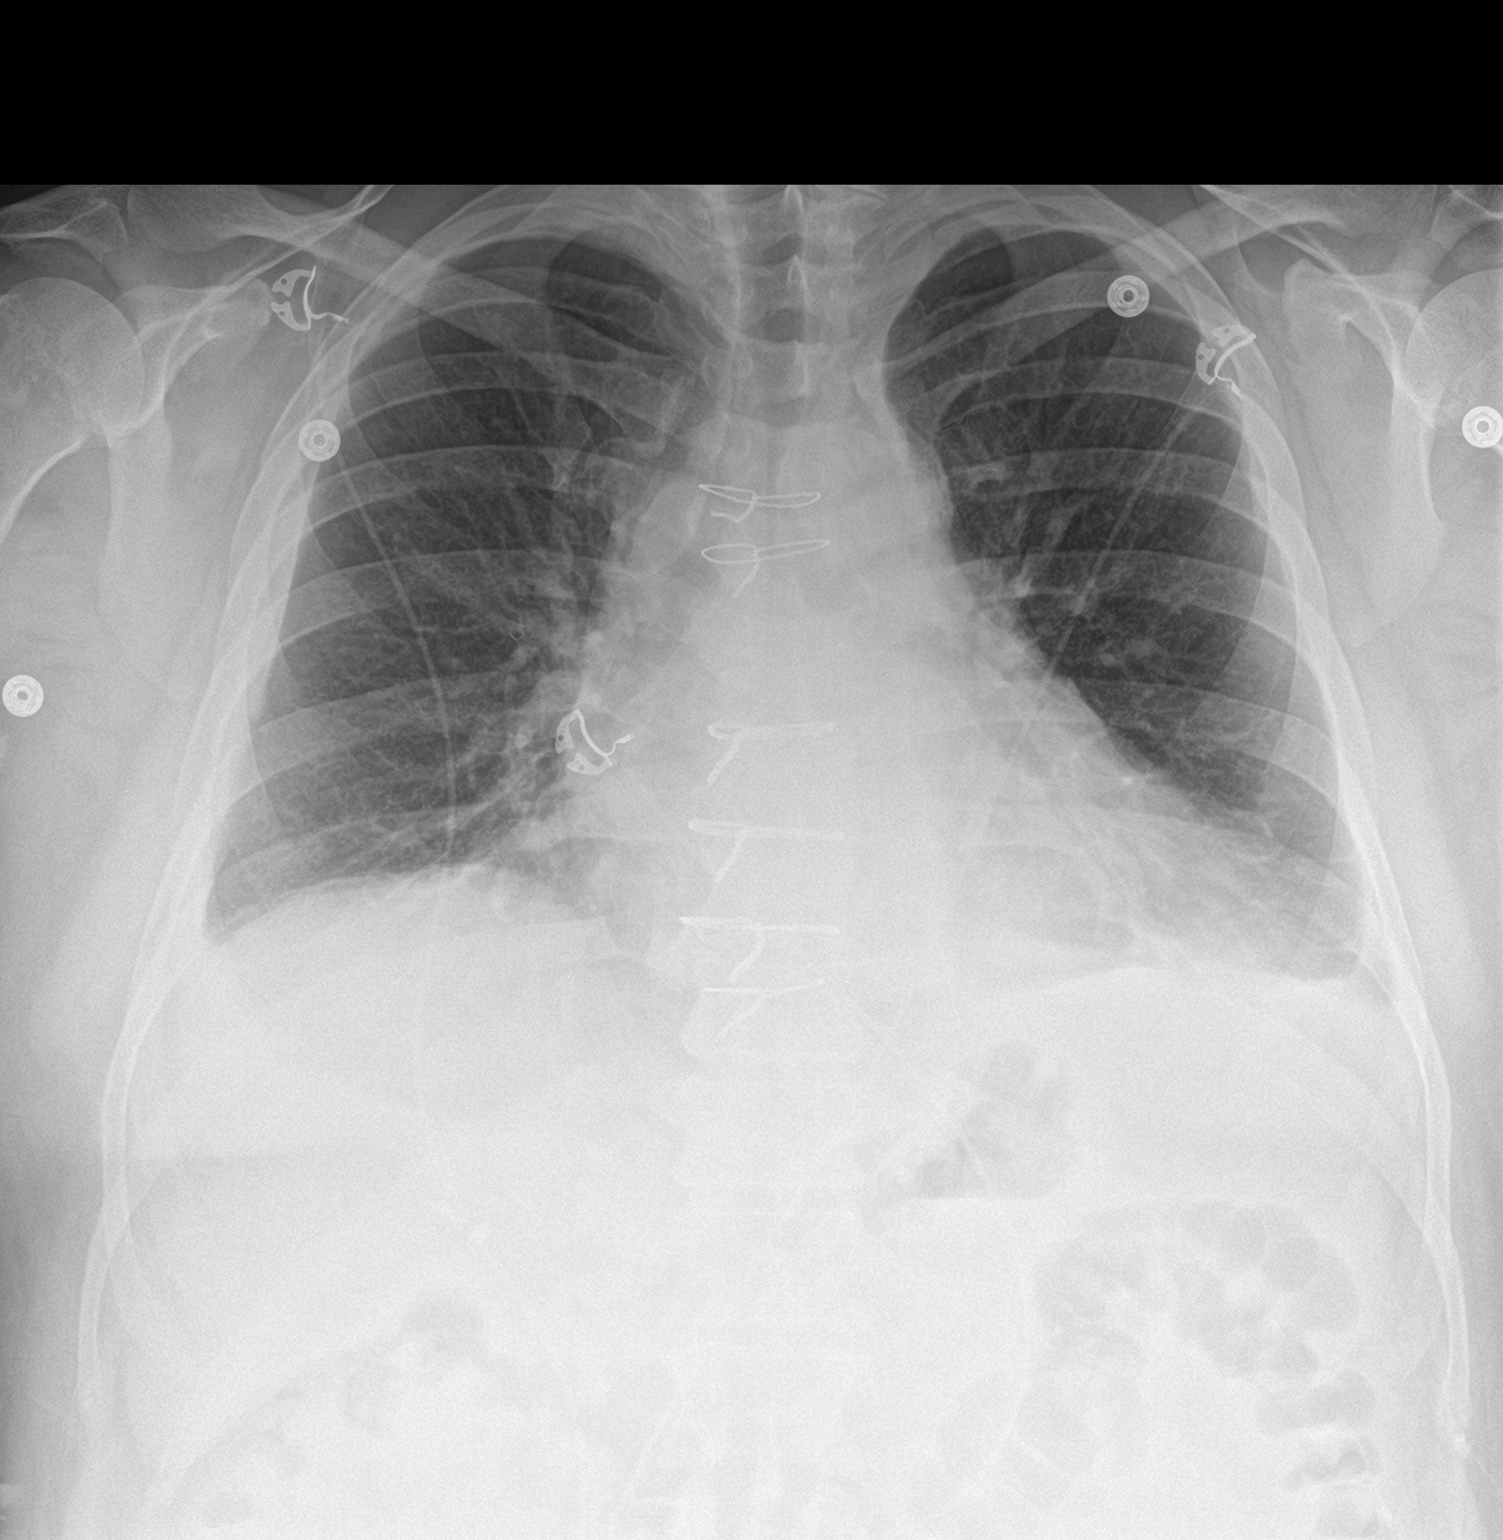

[chest lat]
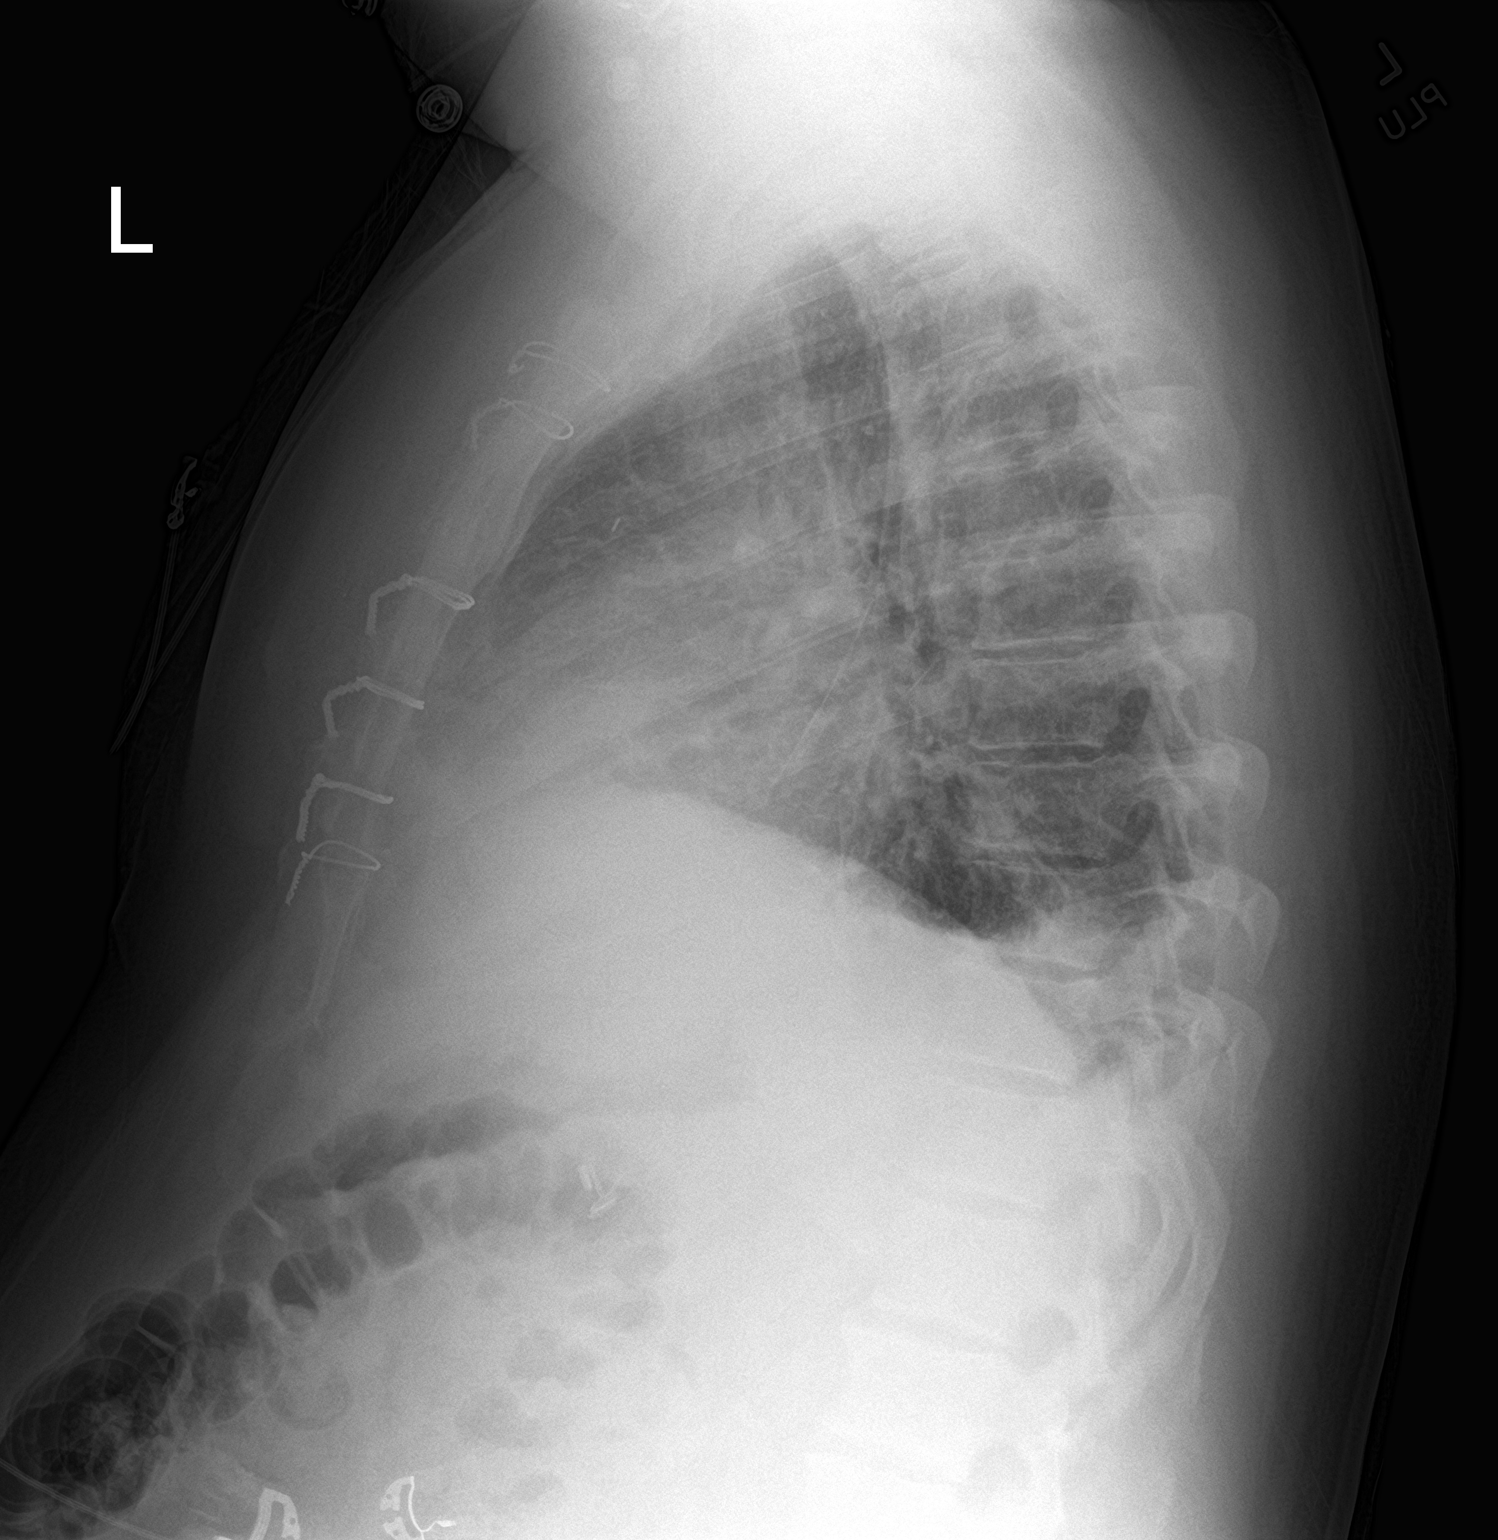

[2 of 2 positions shown; findings below may reference images not displayed]

FINDINGS: Lung volumes are low. Bibasilar opacities favored to reflect areas
of subsegmental atelectasis. Small bilateral pleural effusions. No
pneumothorax. No evidence of pulmonary edema. Heart size is normal.
Upper mediastinal contours are within normal limits. Atherosclerotic
calcifications are noted in the thoracic aorta.
IMPRESSION: 1. Bibasilar subsegmental atelectasis and small bilateral pleural
effusions.

## 2023-12-20 ENCOUNTER — Encounter: Payer: Self-pay | Admitting: Cardiology

## 2023-12-26 DIAGNOSIS — I2510 Atherosclerotic heart disease of native coronary artery without angina pectoris: Secondary | ICD-10-CM | POA: Diagnosis not present

## 2023-12-26 DIAGNOSIS — Z6837 Body mass index (BMI) 37.0-37.9, adult: Secondary | ICD-10-CM | POA: Diagnosis not present

## 2023-12-26 DIAGNOSIS — R7303 Prediabetes: Secondary | ICD-10-CM | POA: Diagnosis not present

## 2023-12-26 DIAGNOSIS — Z23 Encounter for immunization: Secondary | ICD-10-CM | POA: Diagnosis not present

## 2023-12-26 DIAGNOSIS — Z713 Dietary counseling and surveillance: Secondary | ICD-10-CM | POA: Diagnosis not present

## 2024-02-18 ENCOUNTER — Other Ambulatory Visit

## 2024-02-18 DIAGNOSIS — E291 Testicular hypofunction: Secondary | ICD-10-CM

## 2024-02-19 LAB — TESTOSTERONE: Testosterone: 482 ng/dL (ref 264–916)

## 2024-02-20 ENCOUNTER — Ambulatory Visit: Payer: Self-pay | Admitting: Urology

## 2024-02-21 ENCOUNTER — Ambulatory Visit: Attending: Cardiology | Admitting: Cardiology

## 2024-02-21 ENCOUNTER — Encounter: Payer: Self-pay | Admitting: Cardiology

## 2024-02-21 VITALS — BP 108/68 | HR 69 | Ht 69.0 in | Wt 250.0 lb

## 2024-02-21 DIAGNOSIS — I2510 Atherosclerotic heart disease of native coronary artery without angina pectoris: Secondary | ICD-10-CM

## 2024-02-21 DIAGNOSIS — I480 Paroxysmal atrial fibrillation: Secondary | ICD-10-CM | POA: Diagnosis not present

## 2024-02-21 DIAGNOSIS — I5189 Other ill-defined heart diseases: Secondary | ICD-10-CM

## 2024-02-21 DIAGNOSIS — R002 Palpitations: Secondary | ICD-10-CM

## 2024-02-21 MED ORDER — ROSUVASTATIN CALCIUM 5 MG PO TABS: 5.0000 mg | ORAL_TABLET | Freq: Every day | ORAL | 3 refills | Status: AC

## 2024-02-21 NOTE — Progress Notes (Signed)
 Cardiology Office Note:  .   Date:  02/21/2024  ID:  Shawn Suarez, DOB 12-09-1965, MRN [id number] PCP: Seabron Lenis, MD  [facility] Providers Cardiologist:  Oneil Parchment, MD    History of Present Illness: .   Shawn Suarez is a 58 y.o. male Discussed with the use of AI scribe software   History of Present Illness    Here for follow-up left atrial myxoma resection, maze, perioperative atrial fibrillation, minimal coronary artery plaque LAD, family history of CAD.  At prior visit was battling with plantar fasciitis seen at the [place].SABRA  Home sleep study unremarkable.  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  His daughter rides horses there.  Prior prostate cancer 2016 seen by urology treated with brachytherapy in April 2017.  PSAs have been normal.  Taking bioidentical testosterone -levels look good.  Just started Zepbound for weight loss.          ROS: Denies chest pain  Studies Reviewed: SABRA   EKG Interpretation Date/Time:  Friday February 21 2024 10:37:42 EST Ventricular Rate:  69 PR Interval:  160 QRS Duration:  78 QT Interval:  364 QTC Calculation: 390 R Axis:   13  Text Interpretation: Normal sinus rhythm Inferior infarct (cited on or before 10-May-2021) When compared with ECG of 11-Jan-2023 15:07, No significant change was found Confirmed by Parchment Oneil ([id number]) on 02/21/2024 10:39:24 AM     Risk Assessment/Calculations:       STOP-Bang Score:         Physical Exam:   VS:  BP 108/68   Pulse 69   Ht 5' 9 (1.753 m)   Wt 250 lb (113.4 kg)   SpO2 94%   BMI 36.92 kg/m    Wt Readings from Last 3 Encounters:  02/21/24 250 lb (113.4 kg)  10/02/23 235 lb (106.6 kg)  01/11/23 232 lb (105.2 kg)    GEN: Well nourished, well developed in no acute distress NECK: No JVD; No carotid bruits CARDIAC: RRR, no murmurs, no rubs, no gallops RESPIRATORY:  Clear to  auscultation without rales, wheezing or rhonchi  ABDOMEN: Soft, non-tender, non-distended EXTREMITIES:  No edema; No deformity   ASSESSMENT AND PLAN: .    Assessment and Plan    Post op Atrial Fibrillation I previously reported rare intermittent palpitations and episodes of perceived atrial fibrillation particularly during periods of stress.  Episodes seem to be self-limiting not associated with any adverse symptoms such as syncope or chest pain.  During his left atrial myxoma resection he underwent a Maze procedure.  If symptoms of atrial fibrillation worsen or become more worrisome, we can always refer to electrophysiology.  For now continue with metoprolol  25 mg twice a day.  1 year postoperatively a ZIO monitor showed no evidence of atrial fibrillation.  No longer on Eliquis .  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  Also had pulmonary vein isolation using cryotherapy.  Developed postoperative atrial fibrillation and was placed on amiodarone .  Was on Eliquis  5 mg twice a day at the time beta-blocker and calcium  channel blocker could not be titrated because of low BP.    Premature Ventricular Contractions (PVCs) Reports of occasional runs of PVCs, not associated with syncope. -Continue current management.  Metoprolol .  Coronary artery disease Minimal coronary plaque in the LAD preoperatively.  Will start Crestor  5mg  for plaque stabilization.  His father had muscle aches with Crestor high dosage.  He will monitor this.  He is getting his lipids drawn in February by PCP.  Family history of CAD Father had widow maker MI at age 72 with stent placement.  Mother had atrial fibrillation since her 30s.   Weight Management Recently started Zepbound, GLP-1 Continue with diet and exercise    Follow-up in 1 year unless changes occur.        Signed, Oneil Parchment, MD

## 2024-02-21 NOTE — Patient Instructions (Signed)
 Medication Instructions:  Please start Crestor 5 mg daily. Continue all other medications as listed.  *If you need a refill on your cardiac medications before your next appointment, please call your pharmacy*  Follow-Up: At [facility], you and your health needs are our priority.  As part of our continuing mission to provide you with exceptional heart care, our providers are all part of one team.  This team includes your primary Cardiologist (physician) and Advanced Practice Providers or APPs (Physician Assistants and Nurse Practitioners) who all work together to provide you with the care you need, when you need it.  Your next appointment:   1 year(s)  Provider:   Oneil Parchment, MD    We recommend signing up for the patient portal called MyChart.  Sign up information is provided on this After Visit Summary.  MyChart is used to connect with patients for Virtual Visits (Telemedicine).  Patients are able to view lab/test results, encounter notes, upcoming appointments, etc.  Non-urgent messages can be sent to your provider as well.   To learn more about what you can do with MyChart, go to [email or web address].

## 2024-04-27 ENCOUNTER — Encounter: Payer: Self-pay | Admitting: Urology

## 2024-04-27 ENCOUNTER — Ambulatory Visit: Admitting: Urology

## 2024-04-27 VITALS — BP 127/76 | HR 79 | Ht 69.0 in | Wt 246.0 lb

## 2024-04-27 DIAGNOSIS — N529 Male erectile dysfunction, unspecified: Secondary | ICD-10-CM | POA: Diagnosis not present

## 2024-04-27 DIAGNOSIS — E291 Testicular hypofunction: Secondary | ICD-10-CM | POA: Insufficient documentation

## 2024-04-27 DIAGNOSIS — C61 Malignant neoplasm of prostate: Secondary | ICD-10-CM | POA: Diagnosis not present

## 2024-04-27 LAB — URINALYSIS, ROUTINE W REFLEX MICROSCOPIC
Bilirubin, UA: NEGATIVE
Glucose, UA: NEGATIVE
Leukocytes,UA: NEGATIVE
Nitrite, UA: NEGATIVE
Protein,UA: NEGATIVE
RBC, UA: NEGATIVE
Specific Gravity, UA: 1.02 (ref 1.005–1.030)
Urobilinogen, Ur: 0.2 mg/dL (ref 0.2–1.0)
pH, UA: 6 (ref 5.0–7.5)

## 2024-04-27 LAB — CBC
Hematocrit: 49.8 % (ref 37.5–51.0)
Hemoglobin: 15.9 g/dL (ref 13.0–17.7)
MCH: 27.6 pg (ref 26.6–33.0)
MCHC: 31.9 g/dL (ref 31.5–35.7)
MCV: 86 fL (ref 79–97)
Platelets: 280 x10E3/uL (ref 150–450)
RBC: 5.77 x10E6/uL (ref 4.14–5.80)
RDW: 14.6 % (ref 11.6–15.4)
WBC: 7.6 x10E3/uL (ref 3.4–10.8)

## 2024-04-27 LAB — MICROSCOPIC EXAMINATION: Bacteria, UA: NONE SEEN

## 2024-04-27 LAB — PSA: Prostate Specific Ag, Serum: <0.1 ng/mL (ref 0.0–4.0)

## 2024-04-27 LAB — ESTRADIOL: Estradiol: 47.1 pg/mL — ABNORMAL HIGH (ref 7.6–42.6)

## 2024-04-27 LAB — TESTOSTERONE: Testosterone: 494 ng/dL (ref 264–916)

## 2024-04-27 MED ORDER — SILDENAFIL CITRATE 100 MG PO TABS: 100.0000 mg | ORAL_TABLET | Freq: Every day | ORAL | 11 refills | Status: AC | PRN

## 2024-04-27 NOTE — Progress Notes (Signed)
 "  Assessment: 1. 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 Hypogonadism in male   3. Organic impotence     Plan: Continue bioidentical testosterone  10% daily. Labs today: Testosterone , CBC, PSA, estradiol  Prescription for sildenafil  100 mg as needed provided. Will contact him with lab results Return to office in 6 months.  Chief Complaint:  Chief Complaint  Patient presents with   Prostate Cancer    History of Present Illness:  Shawn Suarez is a 59 y.o. male who is seen for evaluation of prostate cancer and male hypogonadism.SABRA  He was diagnosed with prostate cancer in 2016. 03/09/14 PSA = 3.14 10/06/14 PSA = 3.52 01/17/15 PSA = 3.63 (19.26% free PSA)  10/21/14 DRE = small, normal prostate;  02/24/15 prostate biopsy (TRUS = 31.2 mL) showed 3+3=6 prostate cancer in 2 of 12 biopsy cores (both from left mid) with volumes 6-21%. 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 He was treated with brachytherapy in April 2017. Posttreatment PSA results: 7/17 1.34 10/17 0.72 1/28 0.67 5/18 1.06 11/18 0.62 6/19 0.33 12/19 0.19 2/10 0.13 2/21 0.13 1/22 0.03 12/22 0.03 2/25 0.03  He does not have any lower urinary tract symptoms other than occasional frequency.  No dysuria or gross hematuria. IPSS = 1/0.  At his visit in June 2025, he reported symptoms of fatigue, lack of energy, falling asleep easily, decreased physical strength, decreased libido, and ED.  The erectile dysfunction and decreased libido began after his brachytherapy in 2017.  His other symptoms began approximately 2 years ago following his heart surgery.  He had a testosterone  level checked in February 2025 which was 384 with a low free testosterone . ADAM score = 7/10.  Labs from 6/25: Testosterone  329 calculated bioavailable testosterone  = 130 SHBG  43.8 Prolactin 7.8 LH  2.1  He was started on bioidentical testosterone  10% cream daily in June 2025. Testosterone  7/25:  483 Testosterone  11/25: 482  He returns today for follow-up.  He continues on bioidentical testosterone  10% cream daily.  He has seen symptomatic improvement with decreased fatigue, more energy, improved libido.  He continues to have erectile dysfunction.  He has not tried any therapy recently.  He previously tried tadalafil with minimal improvement and had a significant headache with flushing.  He is not having any lower urinary tract symptoms.  No dysuria or gross hematuria. IPSS = 2.  Portions of the above documentation were copied from a prior visit for review purposes only.   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   ZIO monitor 2022-symptomatic   GERD (gastroesophageal reflux disease)    Paroxysmal atrial fibrillation (HCC) 04/19/2021   ZIO monitor-2022- approximately 10 minutes atrial fibrillation rapid ventricular response    Past Surgical History:  Past Surgical History:  Procedure Laterality Date   CHOLECYSTECTOMY     MEDIASTERNOTOMY N/A 05/10/2021   Procedure: MEDIAN STERNOTOMY;  Surgeon: Kerrin Elspeth BROCKS, MD;  Location: [facility] OR;  Service: Open Heart Surgery;  Laterality: N/A;   MINIMALLY INVASIVE EXCISION OF ATRIAL MYXOMA Right 05/10/2021   Procedure: MINIMALLY INVASIVE EXCISION OF ATRIAL MYXOMA;  Surgeon: Kerrin Elspeth BROCKS,  MD;  Location: [facility] OR;  Service: Open Heart Surgery;  Laterality: Right;   MINIMALLY INVASIVE MAZE PROCEDURE N/A 05/10/2021   Procedure: PARTIAL CRYOMAZE PROCEDURE;  Surgeon: Kerrin Elspeth BROCKS, MD;  Location: [facility] OR;  Service: Open Heart Surgery;  Laterality: N/A;   TEE WITHOUT  CARDIOVERSION  05/10/2021   Procedure: TRANSESOPHAGEAL ECHOCARDIOGRAM (TEE);  Surgeon: Kerrin Elspeth BROCKS, MD;  Location: [facility] OR;  Service: Open Heart Surgery;;    Allergies:  No Known Allergies  Family History:  Family History  Problem Relation Age of Onset   Hypertension Mother    Diabetes Father    Heart attack Neg Hx    Hyperlipidemia Neg Hx    Sudden death Neg Hx     Social History:  Social History   Tobacco Use   Smoking status: Never   Smokeless tobacco: Never   Tobacco comments:    1 can per 1.5 weeks  Vaping Use   Vaping status: Never Used  Substance Use Topics   Alcohol use: Not Currently    Comment: rare   Drug use: Never    ROS: Constitutional:  Negative for fever, chills, weight loss CV: Negative for chest pain, previous MI, hypertension Respiratory:  Negative for shortness of breath, wheezing, sleep apnea, frequent cough GI:  Negative for nausea, vomiting, bloody stool, GERD  Physical exam: BP 127/76   Pulse 79   Ht 5' 9 (1.753 m)   Wt 246 lb (111.6 kg)   BMI 36.33 kg/m  GENERAL APPEARANCE:  Well appearing, well developed, well nourished, NAD HEENT:  Atraumatic, normocephalic, oropharynx clear NECK:  Supple without lymphadenopathy or thyromegaly ABDOMEN:  Soft, non-tender, no masses EXTREMITIES:  Moves all extremities well, without clubbing, cyanosis, or edema NEUROLOGIC:  Alert and oriented x 3, normal gait, CN II-XII grossly intact MENTAL STATUS:  appropriate BACK:  Non-tender to palpation, No CVAT SKIN:  Warm, dry, and intact   Results: U/A: 0-5 WBCs, 0-2 RBCs "

## 2024-04-30 ENCOUNTER — Ambulatory Visit: Payer: Self-pay | Admitting: Urology

## 2024-10-27 ENCOUNTER — Ambulatory Visit: Admitting: Urology
